# Patient Record
Sex: Male | Born: 1960
Health system: Southern US, Community
[De-identification: ages and names within clinical notes are randomized; demographics above are authoritative.]

## PROBLEM LIST (undated history)

## (undated) DIAGNOSIS — C20 Malignant neoplasm of rectum: Principal | ICD-10-CM

## (undated) DIAGNOSIS — F419 Anxiety disorder, unspecified: Secondary | ICD-10-CM

## (undated) DIAGNOSIS — Z809 Family history of malignant neoplasm, unspecified: Secondary | ICD-10-CM

## (undated) DIAGNOSIS — R002 Palpitations: Secondary | ICD-10-CM

## (undated) DIAGNOSIS — K625 Hemorrhage of anus and rectum: Secondary | ICD-10-CM

## (undated) DIAGNOSIS — R7989 Other specified abnormal findings of blood chemistry: Secondary | ICD-10-CM

## (undated) DIAGNOSIS — K219 Gastro-esophageal reflux disease without esophagitis: Secondary | ICD-10-CM

## (undated) DIAGNOSIS — Z87442 Personal history of urinary calculi: Secondary | ICD-10-CM

## (undated) DIAGNOSIS — Z932 Ileostomy status: Secondary | ICD-10-CM

## (undated) DIAGNOSIS — I499 Cardiac arrhythmia, unspecified: Secondary | ICD-10-CM

## (undated) HISTORY — DX: Malignant neoplasm of rectum: C20

## (undated) HISTORY — DX: Palpitations: R00.2

## (undated) HISTORY — DX: Anxiety disorder, unspecified: F41.9

## (undated) HISTORY — DX: Hemorrhage of anus and rectum: K62.5

## (undated) HISTORY — DX: Other specified abnormal findings of blood chemistry: R79.89

## (undated) HISTORY — DX: Family history of malignant neoplasm, unspecified: Z80.9

---

## 1998-09-01 ENCOUNTER — Emergency Department (HOSPITAL_COMMUNITY): Admission: EM | Admit: 1998-09-01 | Discharge: 1998-09-01 | Payer: Self-pay | Admitting: Emergency Medicine

## 1999-12-15 ENCOUNTER — Emergency Department (HOSPITAL_COMMUNITY): Admission: EM | Admit: 1999-12-15 | Discharge: 1999-12-15 | Payer: Self-pay | Admitting: Internal Medicine

## 1999-12-15 ENCOUNTER — Encounter: Payer: Self-pay | Admitting: Internal Medicine

## 2009-02-08 ENCOUNTER — Encounter: Payer: Self-pay | Admitting: Emergency Medicine

## 2009-02-08 ENCOUNTER — Inpatient Hospital Stay (HOSPITAL_COMMUNITY): Admission: EM | Admit: 2009-02-08 | Discharge: 2009-02-08 | Payer: Self-pay | Admitting: Urology

## 2011-02-25 LAB — COMPREHENSIVE METABOLIC PANEL
ALT: 40 U/L (ref 0–53)
AST: 28 U/L (ref 0–37)
Albumin: 3.7 g/dL (ref 3.5–5.2)
Alkaline Phosphatase: 109 U/L (ref 39–117)
BUN: 17 mg/dL (ref 6–23)
CO2: 27 mEq/L (ref 19–32)
Calcium: 9.5 mg/dL (ref 8.4–10.5)
Chloride: 105 mEq/L (ref 96–112)
Creatinine, Ser: 1.8 mg/dL — ABNORMAL HIGH (ref 0.4–1.5)
GFR calc Af Amer: 49 mL/min — ABNORMAL LOW (ref >60)
GFR calc non Af Amer: 41 mL/min — ABNORMAL LOW (ref >60)
Glucose, Bld: 140 mg/dL — ABNORMAL HIGH (ref 70–99)
Potassium: 4 mEq/L (ref 3.5–5.1)
Sodium: 139 mEq/L (ref 135–145)
Total Bilirubin: 0.7 mg/dL (ref 0.3–1.2)
Total Protein: 7.4 g/dL (ref 6.0–8.3)

## 2011-02-25 LAB — DIFFERENTIAL
Basophils Absolute: 0 10*3/uL (ref 0.0–0.1)
Eosinophils Relative: 0 % (ref 0–5)
Lymphocytes Relative: 6 % — ABNORMAL LOW (ref 12–46)
Monocytes Absolute: 0.5 10*3/uL (ref 0.1–1.0)

## 2011-02-25 LAB — CBC
HCT: 50.5 % (ref 39.0–52.0)
Hemoglobin: 17.4 g/dL — ABNORMAL HIGH (ref 13.0–17.0)
MCHC: 34.5 g/dL (ref 30.0–36.0)
MCV: 89 fL (ref 78.0–100.0)
RDW: 13.4 % (ref 11.5–15.5)

## 2011-02-25 LAB — URINALYSIS, ROUTINE W REFLEX MICROSCOPIC
Bilirubin Urine: NEGATIVE
Glucose, UA: NEGATIVE mg/dL
Ketones, ur: NEGATIVE mg/dL
Leukocytes, UA: NEGATIVE
Nitrite: NEGATIVE
Protein, ur: NEGATIVE mg/dL
Specific Gravity, Urine: 1.019 (ref 1.005–1.030)
Urobilinogen, UA: 0.2 mg/dL (ref 0.0–1.0)
pH: 6 (ref 5.0–8.0)

## 2011-02-25 LAB — LIPASE, BLOOD: Lipase: 26 U/L (ref 11–59)

## 2011-02-25 LAB — URINE MICROSCOPIC-ADD ON

## 2011-02-25 LAB — PROTIME-INR
INR: 1 (ref 0.00–1.49)
Prothrombin Time: 13.3 seconds (ref 11.6–15.2)

## 2011-02-25 LAB — APTT: aPTT: 25 seconds (ref 24–37)

## 2011-03-30 NOTE — H&P (Signed)
[name], [name]                  ACCOUNT NO.:  [id number]   MEDICAL RECORD NO.:  [id number]          PATIENT TYPE:  INP   LOCATION:  1416                         FACILITY:  [facility]   PHYSICIAN:  Maretta Bees. Vonita Moss, M.D.DATE OF BIRTH:  1961/02/06   DATE OF ADMISSION:  02/08/2009  DATE OF DISCHARGE:                              HISTORY & PHYSICAL   HISTORY:  This 50 year old gentleman had the onset yesterday of severe  left lower quadrant pain.  He might have had an increased urge to void.  He had no fever or hematuria.  He had no history of stones.  There was a  family history of stones.  In the emergency room he was found to have a  CT scan showing a 5 mm stone in the distal left ureter with mild  hydronephrosis.  He also had a small and nonobstructing renal stone.  His serum creatinine is 1.8.   He was admitted for pain relief.   PAST MEDICAL HISTORY:  Negative.   REGULAR MEDICATIONS:  None.   ALLERGIES:  None.   Tobacco:  None.   Alcohol:  Occasional beer.   FAMILY HISTORY:  Positive for stones.   REVIEW OF SYSTEMS:  No headache, dizziness, cough, chest pain, shortness  of breath, diarrhea or constipation.   PHYSICAL EXAMINATION:  VITALS:  Are as recorded.  He is alert and  oriented.  SKIN:  Warm and dry.  Very mild distress at this time after receiving  parenteral narcotics.  NECK:  Supple.  No respiratory distress.  Heart tones regular.  ABDOMEN:  Soft and nontender.  No hernias.  Penis, urethral meatus, scrotum, testicles, and epididymes unremarkable.  No peripheral edema.   IMPRESSION:  1. Distal left ureteral calculus.  2. Mild renal insufficiency.  3. Renal calculus.   PLAN:  IV hydration, Flomax, strain his urine, IV narcotics, and  consider intervention with lithotripsy or ureteroscopy pending his  clinical course.      Maretta Bees. Vonita Moss, M.D.  Electronically Signed     LJP/MEDQ  D:  02/08/2009  T:  02/08/2009  Job:  [id number]

## 2012-07-23 ENCOUNTER — Encounter (HOSPITAL_COMMUNITY): Payer: Self-pay

## 2012-07-23 ENCOUNTER — Emergency Department (HOSPITAL_COMMUNITY): Payer: 59

## 2012-07-23 ENCOUNTER — Emergency Department (HOSPITAL_COMMUNITY)
Admission: EM | Admit: 2012-07-23 | Discharge: 2012-07-23 | Disposition: A | Discharge status: Home / Self Care | Payer: 59 | Attending: Emergency Medicine | Admitting: Emergency Medicine

## 2012-07-23 DIAGNOSIS — I498 Other specified cardiac arrhythmias: Secondary | ICD-10-CM | POA: Insufficient documentation

## 2012-07-23 DIAGNOSIS — R002 Palpitations: Secondary | ICD-10-CM

## 2012-07-23 DIAGNOSIS — I493 Ventricular premature depolarization: Secondary | ICD-10-CM

## 2012-07-23 LAB — CBC
HCT: 43.7 % (ref 39.0–52.0)
Hemoglobin: 15.5 g/dL (ref 13.0–17.0)
MCH: 30.9 pg (ref 26.0–34.0)
MCHC: 35.5 g/dL (ref 30.0–36.0)
MCV: 87.2 fL (ref 78.0–100.0)
Platelets: 209 10*3/uL (ref 150–400)
RBC: 5.01 MIL/uL (ref 4.22–5.81)
RDW: 12.8 % (ref 11.5–15.5)
WBC: 3.8 10*3/uL — ABNORMAL LOW (ref 4.0–10.5)

## 2012-07-23 LAB — BASIC METABOLIC PANEL
CO2: 25 mEq/L (ref 19–32)
Calcium: 9.4 mg/dL (ref 8.4–10.5)
Creatinine, Ser: 1.39 mg/dL — ABNORMAL HIGH (ref 0.50–1.35)

## 2012-07-23 LAB — TROPONIN I: Troponin I: <0.3 ng/mL (ref <0.30)

## 2012-07-23 NOTE — ED Provider Notes (Cosign Needed)
History    51 year old male with palpitations. Onset about a week ago. Intermittent. No appreciable exacerbating relieving factors. Patient states that his heart beat sometimes feels a regular and occasionally fast. No chest pain or shortness of breath. No fevers or chills. No nausea or vomiting. Denies significant caffeine intake. Denies over-the-counter medications. Denies drug use. Denies any significant past medical history. Patient is also complaining of some tingling in his left hand along the ulnar aspect and in his little finger. No weakness. No other complaints. CSN: [id number]  Arrival date & time 07/23/12  1647   First MD Initiated Contact with Patient 07/23/12 1657      Chief Complaint  Patient presents with  . Tachycardia    (Consider location/radiation/quality/duration/timing/severity/associated sxs/prior treatment) HPI  History reviewed. No pertinent past medical history.  History reviewed. No pertinent past surgical history.  No family history on file.  History  Substance Use Topics  . Smoking status: Never Smoker   . Smokeless tobacco: Not on file  . Alcohol Use: Yes     occ      Review of Systems   Review of symptoms negative unless otherwise noted in HPI.   Allergies  Review of patient's allergies indicates no known allergies.  Home Medications  No current outpatient prescriptions on file.  BP 116/83  Pulse 67  Temp 98.6 F (37 C) (Oral)  Resp 18  Ht 5\' 9"  (1.753 m)  Wt 180 lb (81.647 kg)  BMI 26.58 kg/m2  SpO2 96%  Physical Exam  Nursing note and vitals reviewed. Constitutional: He appears well-developed and well-nourished. No distress.  HENT:  Head: Normocephalic and atraumatic.  Eyes: Conjunctivae normal are normal. Right eye exhibits no discharge. Left eye exhibits no discharge.  Neck: Neck supple.  Cardiovascular: Normal rate, regular rhythm and normal heart sounds.  Exam reveals no gallop and no friction rub.   No murmur heard.    Occasional ectopy  Pulmonary/Chest: Effort normal and breath sounds normal. No respiratory distress.  Abdominal: Soft. He exhibits no distension. There is no tenderness.  Musculoskeletal: He exhibits no edema and no tenderness.       No left elbow tenderness, swelling or pain with range of motion.  Neurological: He is alert. No cranial nerve deficit. He exhibits normal muscle tone. Coordination normal.       Sensation in the ulnar aspect of the patient's left hand, fifth finger and ulnar aspect of his fourth finger feels subjectively different as compared to the area. Radial, median and ulnar motor function is intact.  Skin: Skin is warm and dry. He is not diaphoretic.  Psychiatric: He has a normal mood and affect. His behavior is normal. Thought content normal.    ED Course  Procedures (including critical care time)  Labs Reviewed  BASIC METABOLIC PANEL - Abnormal; Notable for the following:    Glucose, Bld 110 (*)     Creatinine, Ser 1.39 (*)     GFR calc non Af Amer 58 (*)     GFR calc Af Amer 67 (*)     All other components within normal limits  CBC - Abnormal; Notable for the following:    WBC 3.8 (*)     All other components within normal limits  TROPONIN I  LAB REPORT - SCANNED   No results found.  EKG:  Rhythm: normal sinus with PVCs Rate: 65 Axis: normal Intervals: normal ST segments: NS ST changes  1. Palpitations   2. PVC (premature ventricular contraction)  MDM  50yM with palpitations. Suspect feeling PVCs which he is having fairly frequently. No known hx of CAD, no significant risk factors, denies CP or SOB and reassuring w/u today. Consider result of ischemia but doubt. Patient's heart rate is hard in the 60s and his blood pressure is normal. Will defer from a low dose of a beta blocker. Will refer to cardiology for further valuation. Suspect tingling in patient's left hand is an ulnar neuropathy given the distribution of the symptoms. Very low  suspicion for anginal equivalent. Not consistent with a CVA. Nonfocal neurological examination otherwise. Return precautions discussed. Outpatient followup otherwise.        Raeford Razor, MD 07/27/12 [phone]

## 2012-07-23 NOTE — ED Notes (Signed)
Patient with no complaints at this time. Respirations even and unlabored. Skin warm/dry. Discharge instructions reviewed with patient at this time. Patient given opportunity to voice concerns/ask questions. IV removed per policy and band-aid applied to site. Patient discharged at this time and left Emergency Department with steady gait.  

## 2012-07-23 NOTE — ED Notes (Signed)
MD at bedside. 

## 2012-07-23 NOTE — ED Notes (Signed)
Pt reports felt heart beating fast for the past week.  Denies chest pain or SOB, reports has felt light headed.  Pt says notices gets tired faster than normal.  Also c/o tingling in left hand.

## 2012-07-25 ENCOUNTER — Encounter: Payer: Self-pay | Admitting: Adult Health

## 2012-07-25 ENCOUNTER — Ambulatory Visit (INDEPENDENT_AMBULATORY_CARE_PROVIDER_SITE_OTHER): Payer: 59 | Admitting: Adult Health

## 2012-07-25 VITALS — BP 112/78 | HR 67 | Ht 69.0 in | Wt 189.5 lb

## 2012-07-25 DIAGNOSIS — R002 Palpitations: Secondary | ICD-10-CM

## 2012-07-25 NOTE — Assessment & Plan Note (Signed)
The patient has no risk factors for coronary artery disease that we are aware of. He has no history of hypertension, diabetes, or thyroid disease.. we will plan a TSH to be drawn, please take cardiac monitor for the patient to wear for 3 weeks, and also an echocardiogram will be completed. I discussed this with Dr. Dietrich Pates on site who agrees with my assessment and plan. He will followup in the office for discussion of test results. As the discretion of Dr. Dietrich Pates he can proceed with a stress Myoview for any abnormalities noted on his test results. This has been discussed with the patient verbalizes understanding and is willing to proceed with this examination.

## 2012-07-25 NOTE — Progress Notes (Signed)
   HPI: [name] is a pleasant 51 year old patient we are seeing on ER followup with no prior cardiac history, no past medical history history whatsoever of hypertension diabetes or thyroid disease. He was in his usual state of health but had a sudden onset of tachycardia palpitations and chest pressure which lasted approximately 5 days. 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 He states he noticed frequent heart racing and became concerned after it did not go away on its own and therefore was seen in the ER. In ER EKG revealed sinus rhythm with tall R waves in V2 and lead 3. He was also found to be having frequent premature ventricular complexes heart rate was in the 60s. Labs were drawn he was found to have a normal sodium potassium and glucose. His creatinine was mildly elevated at 1.39.     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 He has no family history of coronary disease. He does not smoke or drink alcohol. No Known Allergies  No current outpatient prescriptions on file.    No past medical history on file.  No past surgical history on file.  QIO:NGEXBM of systems complete and found to be negative unless listed above  PHYSICAL EXAM BP 112/78  Pulse 67  Ht 5\' 9"  (1.753 m)  Wt 189 lb 8 oz (85.957 kg)  BMI 27.98 kg/m2  General: Well developed, well nourished, in no acute distress Head: Eyes PERRLA, No xanthomas.   Normal cephalic and atramatic  Lungs: Clear bilaterally to auscultation and percussion. Heart: HRRR S1 S2, occasional extra systole.  Pulses are 2+ & equal.            No carotid bruit. No JVD.  No abdominal bruits. No femoral bruits. Abdomen: Bowel sounds are positive, abdomen soft and non-tender without masses or                  Hernia's noted. Msk:  Back normal, normal gait. Normal strength and tone for age. Extremities: No clubbing, cyanosis or edema.   DP +1 Neuro: Alert and oriented X 3. Psych:  Good affect, responds appropriately  EKG:NSR with occassional PVC's HR 65 bpm.  Tall R wave in V2.   ASSESSMENT AND PLAN

## 2012-07-25 NOTE — Patient Instructions (Signed)
Your physician has requested that you have an echocardiogram. Echocardiography is a painless test that uses sound waves to create images of your heart. It provides your doctor with information about the size and shape of your heart and how well your heart's chambers and valves are working. This procedure takes approximately one hour. There are no restrictions for this procedure.  Your physician has recommended that you wear an event monitor. Event monitors are medical devices that record the heart's electrical activity. Doctors most often [place] these monitors to diagnose arrhythmias. Arrhythmias are problems with the speed or rhythm of the heartbeat. The monitor is a small, portable device. You can wear one while you do your normal daily activities. This is usually used to diagnose what is causing palpitations/syncope (passing out).  LABS TODAY:  TSH  Follow up when results of echo and monitor are ready.

## 2012-07-26 ENCOUNTER — Ambulatory Visit: Payer: 59

## 2012-07-26 ENCOUNTER — Ambulatory Visit (HOSPITAL_COMMUNITY)
Admission: RE | Admit: 2012-07-26 | Discharge: 2012-07-26 | Disposition: A | Discharge status: Home / Self Care | Payer: 59 | Source: Ambulatory Visit | Attending: Adult Health | Admitting: Adult Health

## 2012-07-26 DIAGNOSIS — I517 Cardiomegaly: Secondary | ICD-10-CM

## 2012-07-26 DIAGNOSIS — R002 Palpitations: Secondary | ICD-10-CM | POA: Insufficient documentation

## 2012-07-26 NOTE — Progress Notes (Signed)
*  PRELIMINARY RESULTS* Echocardiogram 2D Echocardiogram has been performed.  Craig [name] 07/26/2012, 9:23 AM

## 2012-07-28 ENCOUNTER — Encounter: Payer: 59 | Admitting: Adult Health

## 2012-07-31 ENCOUNTER — Telehealth: Payer: Self-pay | Admitting: Cardiology

## 2012-07-31 ENCOUNTER — Encounter: Payer: Self-pay | Admitting: *Deleted

## 2012-07-31 NOTE — Telephone Encounter (Signed)
Patient notified that ekg was normal with symptoms of chest pain.  He drives a truck for a living and states that he needs a work note stating that he cannot drive any further than a 161 mile radius, as long trips are causing him too much stress at this point.  His employer will not allow him to come back without a note, stating this.

## 2012-07-31 NOTE — Telephone Encounter (Signed)
Patient notified and a letter has been written to state recommendations below and was given to wife this afternoon.

## 2012-07-31 NOTE — Telephone Encounter (Signed)
Pt states he stayed out of work due to chest pain on Sunday 07/30/12. States he recorded it on the heart monitor he has on. Wants to know if we can give his work a excuse letter due to chest pain

## 2012-07-31 NOTE — Telephone Encounter (Signed)
He can have the letter for temporary restriction only, until all tests are completed and he has follow-up appointment. One month only. The question of his mile restriction limits should be addressed at that visit.

## 2012-08-25 ENCOUNTER — Ambulatory Visit (INDEPENDENT_AMBULATORY_CARE_PROVIDER_SITE_OTHER): Payer: 59 | Admitting: Cardiology

## 2012-08-25 ENCOUNTER — Encounter: Payer: Self-pay | Admitting: Cardiology

## 2012-08-25 VITALS — BP 130/84 | HR 81 | Ht 69.0 in | Wt 188.0 lb

## 2012-08-25 DIAGNOSIS — Z9289 Personal history of other medical treatment: Secondary | ICD-10-CM | POA: Insufficient documentation

## 2012-08-25 DIAGNOSIS — R002 Palpitations: Secondary | ICD-10-CM | POA: Insufficient documentation

## 2012-08-25 DIAGNOSIS — F419 Anxiety disorder, unspecified: Secondary | ICD-10-CM | POA: Insufficient documentation

## 2012-08-25 NOTE — Progress Notes (Deleted)
Name: Craig [name]    DOB: [birth year]  Age: 51 y.o.  MR#: [id number]       PCP:  No primary provider on file.      Insurance: @PAYORNAME @   CC:   No chief complaint on file.  NO MEDICATIONS  VS BP 130/84  Pulse 81  Ht 5\' 9"  (1.753 m)  Wt 188 lb (85.276 kg)  BMI 27.76 kg/m2  SpO2 96%  Weights Current Weight  08/25/12 188 lb (85.276 kg)  07/25/12 189 lb 8 oz (85.957 kg)  07/23/12 180 lb (81.647 kg)    Blood Pressure  BP Readings from Last 3 Encounters:  08/25/12 130/84  07/25/12 112/78  07/23/12 116/83     Admit date:  (Not on file) Last encounter with RMR:  07/31/2012   Allergy No Known Allergies  No current outpatient prescriptions on file.    Discontinued Meds:   There are no discontinued medications.  Patient Active Problem List  Diagnosis  . Palpitations  . Anxiety  . History of diagnostic tests    LABS Office Visit on 07/25/2012  Component Date Value  . TSH 07/25/2012 1.442   Admission on 07/23/2012, Discharged on 07/23/2012  Component Date Value  . Sodium 07/23/2012 135   . Potassium 07/23/2012 3.7   . Chloride 07/23/2012 103   . CO2 07/23/2012 25   . Glucose, Bld 07/23/2012 110*  . BUN 07/23/2012 17   . Creatinine, Ser 07/23/2012 1.39*  . Calcium 07/23/2012 9.4   . GFR calc non Af Amer 07/23/2012 58*  . GFR calc Af Amer 07/23/2012 67*  . Troponin I 07/23/2012 <0.30   . WBC 07/23/2012 3.8*  . RBC 07/23/2012 5.01   . Hemoglobin 07/23/2012 15.5   . HCT 07/23/2012 43.7   . MCV 07/23/2012 87.2   . [facility] 07/23/2012 30.9   . MCHC 07/23/2012 35.5   . RDW 07/23/2012 12.8   . Platelets 07/23/2012 209      Results for this Opt Visit:     Results for orders placed in visit on 07/25/12  TSH      Component Value Range   TSH 1.442  0.350 - 4.500 uIU/mL    EKG Orders placed in visit on 07/25/12  . EKG 12-LEAD  . CARDIAC EVENT MONITOR     Prior Assessment and Plan Problem List as of 08/25/2012          Palpitations   Anxiety   History of  diagnostic tests       Imaging: No results found.   FRS Calculation: Score not calculated. Missing: Total Cholesterol, HDL

## 2012-08-25 NOTE — Assessment & Plan Note (Signed)
There is no evidence for structural heart disease and no indication of other significant pathologic conditions.  PVCs are demonstrated, but are not clearly the cause of his symptoms.  His likelihood of experiencing an arrhythmia with potentially serious consequences is minimal.  He will report any recurrences and will proceed to a medical facility if he experiences a prolonged episode that will permit obtaining an EKG or rhythm strip.

## 2012-08-25 NOTE — Progress Notes (Signed)
Patient ID: Craig [name], male   DOB: [birth year], 51 y.o.   MRN: [id number]  HPI: Patient experiences episodes of chest discomfort, which he describes as a pinching sensation, lasting a matter of seconds.  These were reported during event recording, but I have no strips listing such symptoms.  Only PVCs were identified, frequent at times.  He had no recurrence of the tachypalpitations that first brought him to medical attention.  He consumes modest amounts of caffeine in the form of tea.  He has used energy preparations in the past, but not for months.  He denies significant alcohol intake, illicit substances, or over-the-counter stimulants.  Prior to Admission medications   None  No Known Allergies    Past medical history, social history, and family history reviewed and updated.  ROS: Exercises regularly running up to 6 miles without difficulty.  PHYSICAL EXAM: BP 130/84  Pulse 81  Ht 5\' 9"  (1.753 m)  Wt 85.276 kg (188 lb)  BMI 27.76 kg/m2  SpO2 96%  General-Well developed; no acute distress Body habitus-proportionate weight and height Neck-No JVD; no carotid bruits Lungs-clear lung fields; resonant to percussion Cardiovascular-normal PMI; normal S1 and S2 Abdomen-normal bowel sounds; soft and non-tender without masses or organomegaly Musculoskeletal-No deformities, no cyanosis or clubbing Neurologic-Normal cranial nerves; symmetric strength and tone Skin-Warm, no significant lesions Extremities-distal pulses intact; no edema  ASSESSMENT AND PLAN:  [place] Bing, MD 08/25/2012 3:06 PM

## 2012-08-25 NOTE — Patient Instructions (Addendum)
Your physician recommends that you schedule a follow-up appointment in: 6 months  

## 2012-09-14 ENCOUNTER — Other Ambulatory Visit: Payer: Self-pay | Admitting: Adult Health

## 2012-09-14 DIAGNOSIS — R002 Palpitations: Secondary | ICD-10-CM

## 2014-07-29 ENCOUNTER — Encounter: Payer: 59 | Admitting: Cardiology

## 2014-07-29 NOTE — Progress Notes (Signed)
ERROR

## 2017-01-06 ENCOUNTER — Encounter (INDEPENDENT_AMBULATORY_CARE_PROVIDER_SITE_OTHER): Payer: Self-pay | Admitting: Internal Medicine

## 2017-01-06 ENCOUNTER — Encounter (INDEPENDENT_AMBULATORY_CARE_PROVIDER_SITE_OTHER): Payer: Self-pay

## 2017-01-07 ENCOUNTER — Encounter (INDEPENDENT_AMBULATORY_CARE_PROVIDER_SITE_OTHER): Payer: Self-pay | Admitting: *Deleted

## 2017-01-07 ENCOUNTER — Ambulatory Visit (INDEPENDENT_AMBULATORY_CARE_PROVIDER_SITE_OTHER): Payer: Self-pay | Admitting: Internal Medicine

## 2017-01-07 ENCOUNTER — Other Ambulatory Visit (INDEPENDENT_AMBULATORY_CARE_PROVIDER_SITE_OTHER): Payer: Self-pay | Admitting: Internal Medicine

## 2017-01-07 ENCOUNTER — Encounter (INDEPENDENT_AMBULATORY_CARE_PROVIDER_SITE_OTHER): Payer: Self-pay | Admitting: Internal Medicine

## 2017-01-07 VITALS — BP 118/90 | HR 72 | Temp 98.1°F | Ht 69.5 in | Wt 195.0 lb

## 2017-01-07 DIAGNOSIS — K6289 Other specified diseases of anus and rectum: Secondary | ICD-10-CM

## 2017-01-07 DIAGNOSIS — K625 Hemorrhage of anus and rectum: Secondary | ICD-10-CM

## 2017-01-07 DIAGNOSIS — K629 Disease of anus and rectum, unspecified: Secondary | ICD-10-CM

## 2017-01-07 HISTORY — DX: Hemorrhage of anus and rectum: K62.5

## 2017-01-07 NOTE — Patient Instructions (Signed)
Colonoscopy.  The risks and benefits such as perforation, bleeding, and infection were reviewed with the patient and is agreeable. 

## 2017-01-07 NOTE — Progress Notes (Signed)
   Subjective:    Patient ID: Craig [name], male    DOB: 24-Mar-1961, 56 y.o.   MRN: [id number]  HPI Referred by [facility]. Recently seen in ED at [facility] (01/03/2017) with c/o rectal bleeding/constipation.  Had been having rectal bleeding for several weeks.  He states he has blood in his bowel. Two weeks ago he had diarrhea and he saw blood. The diarrhea lasted for 2-3 weeks and resolved. Has been seeing blood for about a month.  He says he has constipation. He has to strain to have a BM. He takes laxatives to have a BM. Hx of constipation.  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  He see blood every day with his BMs.  He has a BM 1-2 times a day. No weight loss.  His appetite is good. No abdominal pain.  He has never undergone a colonoscopy. No family hx of colon cancer. Rarely uses NSAIDs  01/03/2017 Hand H 16.9 and 49.7, AST 39.6, ALT 77, ALP 178/  Review of Systems Past Medical History:  Diagnosis Date  . Anxiety    Truck driver  . Elevated serum creatinine    1.39 in 06/2012  . Palpitations    + chest pressure; PVCs  . Rectal bleeding 01/07/2017    No past surgical history on file.  No Known Allergies  No current outpatient prescriptions on file prior to visit.   No current facility-administered medications on file prior to visit.        Objective:   Physical Exam Blood pressure 118/90, pulse 72, temperature 98.1 F (36.7 C), height 5' 9.5" (1.765 m), weight 195 lb (88.5 kg). Alert and oriented. Skin warm and dry. Oral mucosa is moist.   . Sclera anicteric, conjunctivae is pink. Thyroid not enlarged. No cervical lymphadenopathy. Lungs clear. Heart regular rate and rhythm.  Abdomen is soft. Bowel sounds are positive. No hepatomegaly. No abdominal masses felt. No tenderness.  No edema to lower extremities.  Rectal mass felt. Guaiac positive.         Assessment & Plan:  Rectal bleeding/rectal mass.  Colonic neoplasm needs to be ruled out.  Hemorrhoids, polyps, ulcers, AVM also  in differential.

## 2017-01-10 NOTE — OR Nursing (Signed)
0643.  Patient has not arrived to register for Colonoscopy.  I attempted to call his mobile telephone number with no answer.  I spoke with Dr Laural Golden to advise him of this.  I will call Dr Laural Golden if the patient comes in.

## 2017-01-13 ENCOUNTER — Encounter (INDEPENDENT_AMBULATORY_CARE_PROVIDER_SITE_OTHER): Payer: Self-pay

## 2017-01-21 ENCOUNTER — Encounter (HOSPITAL_COMMUNITY): Admission: RE | Payer: Self-pay | Source: Ambulatory Visit

## 2017-01-21 ENCOUNTER — Ambulatory Visit (HOSPITAL_COMMUNITY): Admission: RE | Admit: 2017-01-21 | Payer: Self-pay | Source: Ambulatory Visit | Admitting: Internal Medicine

## 2017-01-21 SURGERY — COLONOSCOPY
Anesthesia: Moderate Sedation

## 2017-02-04 ENCOUNTER — Emergency Department (HOSPITAL_COMMUNITY): Admission: EM | Admit: 2017-02-04 | Discharge: 2017-02-04 | Discharge status: Absent without leave | Payer: Self-pay

## 2017-07-04 ENCOUNTER — Emergency Department (HOSPITAL_COMMUNITY)
Admission: EM | Admit: 2017-07-04 | Discharge: 2017-07-04 | Disposition: A | Discharge status: Home / Self Care | Payer: Self-pay | Attending: Emergency Medicine | Admitting: Emergency Medicine

## 2017-07-04 ENCOUNTER — Encounter (HOSPITAL_COMMUNITY): Payer: Self-pay | Admitting: Emergency Medicine

## 2017-07-04 DIAGNOSIS — Z87891 Personal history of nicotine dependence: Secondary | ICD-10-CM | POA: Insufficient documentation

## 2017-07-04 DIAGNOSIS — K625 Hemorrhage of anus and rectum: Secondary | ICD-10-CM | POA: Insufficient documentation

## 2017-07-04 DIAGNOSIS — K6289 Other specified diseases of anus and rectum: Secondary | ICD-10-CM | POA: Insufficient documentation

## 2017-07-04 LAB — COMPREHENSIVE METABOLIC PANEL
ALT: 27 U/L (ref 17–63)
AST: 19 U/L (ref 15–41)
Albumin: 3.6 g/dL (ref 3.5–5.0)
Alkaline Phosphatase: 112 U/L (ref 38–126)
Anion gap: 6 (ref 5–15)
BUN: 16 mg/dL (ref 6–20)
CALCIUM: 9.1 mg/dL (ref 8.9–10.3)
CHLORIDE: 109 mmol/L (ref 101–111)
CO2: 23 mmol/L (ref 22–32)
CREATININE: 1.13 mg/dL (ref 0.61–1.24)
GFR calc non Af Amer: >60 mL/min (ref >60)
Glucose, Bld: 87 mg/dL (ref 65–99)
Potassium: 3.8 mmol/L (ref 3.5–5.1)
Sodium: 138 mmol/L (ref 135–145)
Total Bilirubin: 0.6 mg/dL (ref 0.3–1.2)
Total Protein: 7.2 g/dL (ref 6.5–8.1)

## 2017-07-04 LAB — POC OCCULT BLOOD, ED: FECAL OCCULT BLD: POSITIVE — AB

## 2017-07-04 LAB — CBC WITH DIFFERENTIAL/PLATELET
BASOS ABS: 0 10*3/uL (ref 0.0–0.1)
Basophils Relative: 0 %
EOS ABS: 0.2 10*3/uL (ref 0.0–0.7)
EOS PCT: 4 %
HCT: 45.2 % (ref 39.0–52.0)
HEMOGLOBIN: 15.8 g/dL (ref 13.0–17.0)
LYMPHS ABS: 1.5 10*3/uL (ref 0.7–4.0)
Lymphocytes Relative: 33 %
MCH: 29.9 pg (ref 26.0–34.0)
MCHC: 35 g/dL (ref 30.0–36.0)
MCV: 85.6 fL (ref 78.0–100.0)
Monocytes Absolute: 0.4 10*3/uL (ref 0.1–1.0)
Monocytes Relative: 8 %
NEUTROS PCT: 55 %
Neutro Abs: 2.5 10*3/uL (ref 1.7–7.7)
PLATELETS: 250 10*3/uL (ref 150–400)
RBC: 5.28 MIL/uL (ref 4.22–5.81)
RDW: 12.9 % (ref 11.5–15.5)
WBC: 4.6 10*3/uL (ref 4.0–10.5)

## 2017-07-04 MED ORDER — HYDROCORTISONE ACETATE 25 MG RE SUPP: 25.0000 mg | Freq: Two times a day (BID) | RECTAL | 0 refills | Status: DC

## 2017-07-04 NOTE — ED Provider Notes (Signed)
[facility] DEPT Provider Note   CSN: [id number] Arrival date & time: 07/04/17  1547     History   Chief Complaint Chief Complaint  Patient presents with  . Rectal Pain    HPI [name] is a 56 y.o. male.  HPI   Craig [name] is a 56 y.o. male, with a history of rectal bleeding, presenting to the ED with rectal pain and bleeding for the last three months. States he has bleeding and pain when he pushes to have a BM. Also notes a mass "comes out" of the rectum with straining, then goes back in when he relaxes. Bleeding is enough to color the toilet water and looks dark on the stool itself.  Last normal BM was about two weeks ago. Has had consipation for the last few months. Has used mag citrate with some relief, but has not used it recently. Denies chronic narcotic use. Patient is a Administrator for a living. Rarely uses alcohol, but has been using ibuprofen daily for the last 2-3 weeks due to rectal pain.   Denies fever/chills, N/V/D, abdominal pain, urinary complaints, or any other complaints.    Past Medical History:  Diagnosis Date  . Anxiety    Truck driver  . Elevated serum creatinine    1.39 in 06/2012  . Palpitations    + chest pressure; PVCs  . Rectal bleeding 01/07/2017    Patient Active Problem List   Diagnosis Date Noted  . Rectal bleeding 01/07/2017  . Rectal mass 01/07/2017  . History of diagnostic tests 08/25/2012  . Palpitations   . Anxiety     History reviewed. No pertinent surgical history.     Home Medications    Prior to Admission medications   Medication Sig Start Date End Date Taking? Authorizing Provider  acetaminophen (TYLENOL) 325 MG tablet Take 650 mg by mouth every 6 (six) hours as needed for moderate pain or headache.    Yes [provider]  diphenhydrAMINE (SOMINEX) 25 MG tablet Take 50 mg by mouth at bedtime as needed for sleep.   Yes [provider]  ibuprofen (ADVIL,MOTRIN) 200 MG tablet Take 200-400 mg by  mouth every 6 (six) hours as needed for headache or moderate pain.    Yes [provider]  Naphazoline HCl (CLEAR EYES OP) Apply 1 drop to eye daily as needed (dry eyes).   Yes [provider]  oxymetazoline (AFRIN) 0.05 % nasal spray Place 1 spray into both nostrils 2 (two) times daily as needed for congestion.   Yes [provider]  hydrocortisone (ANUSOL-HC) 25 MG suppository Place 1 suppository (25 mg total) rectally 2 (two) times daily. 07/04/17   Lorayne Bender, PA-C    Family History History reviewed. No pertinent family history.  Social History Social History  Substance Use Topics  . Smoking status: Former Research scientist (life sciences)  . Smokeless tobacco: Never Used     Comment: quit at age 89  . Alcohol use Yes     Comment: occ     Allergies   Patient has no known allergies.   Review of Systems Review of Systems  Constitutional: Negative for chills, diaphoresis and fever.  Respiratory: Negative for shortness of breath.   Cardiovascular: Negative for chest pain.  Gastrointestinal: Positive for blood in stool and constipation. Negative for abdominal pain, nausea and vomiting.  Genitourinary: Negative for dysuria, flank pain and hematuria.  Musculoskeletal: Negative for back pain.  All other systems reviewed and are negative.  Physical Exam Updated Vital Signs BP (!) 130/104 (BP Location: Right Arm)   Pulse 70   Temp 99 F (37.2 C) (Oral)   Resp 16   SpO2 100%   Physical Exam  Constitutional: He appears well-developed and well-nourished. No distress.  HENT:  Head: Normocephalic and atraumatic.  Eyes: Conjunctivae are normal.  Neck: Neck supple.  Cardiovascular: Normal rate, regular rhythm, normal heart sounds and intact distal pulses.   Pulmonary/Chest: Effort normal and breath sounds normal. No respiratory distress.  Abdominal: Soft. There is no tenderness. There is no guarding.  Genitourinary: Rectal exam shows guaiac positive stool.  Genitourinary  Comments: No external hemorrhoids, fissures, or lesions noted. No noted internal hemorrhoids. No stool burden. Minor posterior rectal tenderness. Small amount of bright red gross blood. RN, Wells Guiles, served as chaperone during the rectal exam.  Musculoskeletal: He exhibits no edema.  Lymphadenopathy:    He has no cervical adenopathy.  Neurological: He is alert.  Skin: Skin is warm and dry. Capillary refill takes less than 2 seconds. He is not diaphoretic. No pallor.  Psychiatric: He has a normal mood and affect. His behavior is normal.  Nursing note and vitals reviewed.    ED Treatments / Results  Labs (all labs ordered are listed, but only abnormal results are displayed) Labs Reviewed  POC OCCULT BLOOD, ED - Abnormal; Notable for the following:       Result Value   Fecal Occult Bld POSITIVE (*)    All other components within normal limits  COMPREHENSIVE METABOLIC PANEL  CBC WITH DIFFERENTIAL/PLATELET    EKG  EKG Interpretation None       Radiology No results found.  Procedures Procedures (including critical care time)  Medications Ordered in ED Medications - No data to display   Initial Impression / Assessment and Plan / ED Course  I have reviewed the triage vital signs and the nursing notes.  Pertinent labs & imaging results that were available during my care of the patient were reviewed by me and considered in my medical decision making (see chart for details).      Patient presents with complaint of rectal bleeding and pain. Hemoccult positive. Hemoglobin normal. Patient has already been established with GI. The patient was given instructions for home care as well as return precautions. Patient voices understanding of these instructions, accepts the plan, and is comfortable with discharge.    A chart review reveals patient has been evaluated by Deberah Castle, NP at [facility] for [place] for an identical complaint on 01/07/17. He was  guaiac positive at that time. He was recommended for a colonoscopy as the next step. He was scheduled for a colonoscopy to be performed by Dr. Laural Golden on 01/21/17, but patient failed to show up.  When the patient was asked about this, he states, "I didn't go because I guess I thought it would go away. I meant to reschedule, but just haven't."   Final Clinical Impressions(s) / ED Diagnoses   Final diagnoses:  Rectal pain  Rectal bleeding    New Prescriptions New Prescriptions   HYDROCORTISONE (ANUSOL-HC) 25 MG SUPPOSITORY    Place 1 suppository (25 mg total) rectally 2 (two) times daily.     Lorayne Bender, PA-C 07/04/17 2301    Sherwood Gambler, MD 07/06/17 2312

## 2017-07-04 NOTE — Discharge Instructions (Signed)
There is evidence of blood in your stool, however, your other lab results were reassuring. It is highly recommended that you get a colonoscopy, as previously recommended by the GI specialist. This can be with the GI specialist you have already seen or you may follow up with the specialist in [place].  May use the hydrocortisone suppository to give some comfort. May also use over the counter medications like colace to soften your stool. Please increase your water and fiber intake. Also stop using NSAIDs, such as ibuprofen or naproxen.

## 2017-07-04 NOTE — ED Triage Notes (Signed)
Pt states that he believes he has hemmrrhoids because a large mass comes out when he has a bowel movement (with blood) and he has also had constipation. Alert and oriented.

## 2017-07-07 ENCOUNTER — Telehealth (INDEPENDENT_AMBULATORY_CARE_PROVIDER_SITE_OTHER): Payer: Self-pay | Admitting: Internal Medicine

## 2017-07-07 NOTE — Telephone Encounter (Signed)
Patient called and wanted to reschedule his colonoscopy.  Ann reviewed the patient's chart and since it had been a while since we've seen him, I called to schedule him an appointment.  He stated that he just started a new job and his insurance will not kick in for another month.  He wants to make sure his insurance is in effect before he schedules an office visit.  I offered to go ahead and schedule him for a month out and he stated that he wants to make sure for sure his insurance is in effect first and he'll call us back to schedule.  [phone]

## 2017-07-12 ENCOUNTER — Ambulatory Visit (HOSPITAL_COMMUNITY)
Admission: EM | Admit: 2017-07-12 | Discharge: 2017-07-12 | Disposition: A | Discharge status: Home / Self Care | Payer: Self-pay | Attending: Emergency Medicine | Admitting: Emergency Medicine

## 2017-07-12 ENCOUNTER — Encounter (HOSPITAL_COMMUNITY): Payer: Self-pay | Admitting: Family Medicine

## 2017-07-12 DIAGNOSIS — Z0289 Encounter for other administrative examinations: Secondary | ICD-10-CM

## 2017-07-12 DIAGNOSIS — Z7689 Persons encountering health services in other specified circumstances: Secondary | ICD-10-CM

## 2017-07-12 NOTE — ED Provider Notes (Signed)
[facility]   [id number] 07/12/17 Arrival Time: 1058   SUBJECTIVE:  Craig [name] is a 56 y.o. male who presents to the urgent care  with complaint of needing clearance to return to work. He has had a history of intermittent rectal bleeding, is been evaluated by GI, and the ER [facility] for this complaint, he is scheduled to have an upcoming colonoscopy next month after his insurance kicks in. Following his visit [facility], he received a work clearance allowing him to return to work without condition, however he is Production designer, theatre/television/film, and needed the clearance to have clear language. Medical records were reviewed, and the medication prescribed at the ER visit she gave no impact on driving, use Anusol suppositories. He is currently eating a high-fiber diet, and has had no bleeding or issues since his visit.  ROS: As per HPI, remainder of ROS negative.   OBJECTIVE:  Vitals:   07/12/17 1141  BP: (!) 139/92  Pulse: 89  Resp: 18  Temp: 98 F (36.7 C)  SpO2: 100%     General appearance: alert; no distress HEENT: normocephalic; atraumatic; conjunctivae normal;  Neck: Trachea midline, no JVD noted Lungs: clear to auscultation bilaterally Heart: regular rate and rhythm, S1, and S2, no murmur heard intact distal pulses Abdomen: soft, non-tender; bowel sounds normal; no masses or organomegaly; no guarding or rebound tenderness Musculoskeletal: Grossly symmetrical without edema Skin: warm and dry Neurologic: Grossly normal Psychological:  alert and cooperative; normal mood and affect     ASSESSMENT & PLAN:  1. Return to work exam     Provided work note, encouraged to follow up with GI when possible  Reviewed expectations re: course of current medical issues. Questions answered. Outlined signs and symptoms indicating need for more acute intervention. Patient verbalized understanding. After Visit Summary given.    Procedures:     Results for orders placed  or performed during the hospital encounter of 07/04/17  Comprehensive metabolic panel  Result Value Ref Range   Sodium 138 135 - 145 mmol/L   Potassium 3.8 3.5 - 5.1 mmol/L   Chloride 109 101 - 111 mmol/L   CO2 23 22 - 32 mmol/L   Glucose, Bld 87 65 - 99 mg/dL   BUN 16 6 - 20 mg/dL   Creatinine, Ser 1.13 0.61 - 1.24 mg/dL   Calcium 9.1 8.9 - 10.3 mg/dL   Total Protein 7.2 6.5 - 8.1 g/dL   Albumin 3.6 3.5 - 5.0 g/dL   AST 19 15 - 41 U/L   ALT 27 17 - 63 U/L   Alkaline Phosphatase 112 38 - 126 U/L   Total Bilirubin 0.6 0.3 - 1.2 mg/dL   GFR calc non Af Amer >60 >60 mL/min   GFR calc Af Amer >60 >60 mL/min   Anion gap 6 5 - 15  CBC with Differential  Result Value Ref Range   WBC 4.6 4.0 - 10.5 K/uL   RBC 5.28 4.22 - 5.81 MIL/uL   Hemoglobin 15.8 13.0 - 17.0 g/dL   HCT 45.2 39.0 - 52.0 %   MCV 85.6 78.0 - 100.0 fL   MCH 29.9 26.0 - 34.0 pg   MCHC 35.0 30.0 - 36.0 g/dL   RDW 12.9 11.5 - 15.5 %   Platelets 250 150 - 400 K/uL   Neutrophils Relative % 55 %   Neutro Abs 2.5 1.7 - 7.7 K/uL   Lymphocytes Relative 33 %   Lymphs Abs 1.5 0.7 - 4.0 K/uL  Monocytes Relative 8 %   Monocytes Absolute 0.4 0.1 - 1.0 K/uL   Eosinophils Relative 4 %   Eosinophils Absolute 0.2 0.0 - 0.7 K/uL   Basophils Relative 0 %   Basophils Absolute 0.0 0.0 - 0.1 K/uL  POC occult blood, ED Provider will collect  Result Value Ref Range   Fecal Occult Bld POSITIVE (A) NEGATIVE    Labs Reviewed - No data to display  No results found.  No Known Allergies  PMHx, SurgHx, SocialHx, Medications, and Allergies were reviewed in the Visit Navigator and updated as appropriate.       Barnet Glasgow, NP 07/12/17 1154

## 2017-07-12 NOTE — ED Triage Notes (Signed)
Pt here for work note and wanting to return.

## 2017-07-12 NOTE — Discharge Instructions (Signed)
Follow-up with your GI specialist for further evaluation, as this will need colonoscopy

## 2017-09-06 ENCOUNTER — Ambulatory Visit (INDEPENDENT_AMBULATORY_CARE_PROVIDER_SITE_OTHER): Payer: Self-pay | Admitting: Internal Medicine

## 2017-09-07 ENCOUNTER — Other Ambulatory Visit (INDEPENDENT_AMBULATORY_CARE_PROVIDER_SITE_OTHER): Payer: Self-pay | Admitting: Internal Medicine

## 2017-09-07 ENCOUNTER — Encounter (INDEPENDENT_AMBULATORY_CARE_PROVIDER_SITE_OTHER): Payer: Self-pay | Admitting: *Deleted

## 2017-09-07 ENCOUNTER — Ambulatory Visit (INDEPENDENT_AMBULATORY_CARE_PROVIDER_SITE_OTHER): Payer: BLUE CROSS/BLUE SHIELD | Admitting: Internal Medicine

## 2017-09-07 ENCOUNTER — Encounter (INDEPENDENT_AMBULATORY_CARE_PROVIDER_SITE_OTHER): Payer: Self-pay

## 2017-09-07 ENCOUNTER — Encounter (INDEPENDENT_AMBULATORY_CARE_PROVIDER_SITE_OTHER): Payer: Self-pay | Admitting: Internal Medicine

## 2017-09-07 VITALS — BP 110/80 | HR 60 | Ht 69.0 in | Wt 185.1 lb

## 2017-09-07 DIAGNOSIS — K629 Disease of anus and rectum, unspecified: Secondary | ICD-10-CM | POA: Diagnosis not present

## 2017-09-07 DIAGNOSIS — K6289 Other specified diseases of anus and rectum: Secondary | ICD-10-CM

## 2017-09-07 NOTE — Patient Instructions (Signed)
Colonoscopy. The risks of bleeding, perforation and infection were reviewed with patient.  

## 2017-09-07 NOTE — Progress Notes (Signed)
   Subjective:    Patient ID: Craig [name], male    DOB: 14-Mar-1961, 56 y.o.   MRN: [id number]  HPI Here today for f/u. Last seen in February of this with rectal bleeding/constipation. Rectal mass was felt and he was scheduled for a colonoscopy, however he cancelled the procedure. . States he has feeling of pressure and being backed up all the times. He still has rectal bleeding.  Has a BM x 1 every 2 weeks. Sees blood every day. No family hx of colon cancer. Recently seen in the ED for same.   No NSAIDs CBC    Component Value Date/Time   WBC 4.6 07/04/2017 2154   RBC 5.28 07/04/2017 2154   HGB 15.8 07/04/2017 2154   HCT 45.2 07/04/2017 2154   PLT 250 07/04/2017 2154   MCV 85.6 07/04/2017 2154   MCH 29.9 07/04/2017 2154   MCHC 35.0 07/04/2017 2154   RDW 12.9 07/04/2017 2154   LYMPHSABS 1.5 07/04/2017 2154   MONOABS 0.4 07/04/2017 2154   EOSABS 0.2 07/04/2017 2154   BASOSABS 0.0 07/04/2017 2154       Review of Systems     Objective:   Physical Exam Blood pressure 110/80, pulse 60, height 5\' 9"  (1.753 m), weight 185 lb 1.6 oz (84 kg). Alert and oriented. Skin warm and dry. Oral mucosa is moist.   . Sclera anicteric, conjunctivae is pink. Thyroid not enlarged. No cervical lymphadenopathy. Lungs clear. Heart regular rate and rhythm.  Abdomen is soft. Bowel sounds are positive. No hepatomegaly. No abdominal masses felt. No tenderness.  No edema to lower extremities.            Assessment & Plan:  Rectal mass. Colonic neoplasm needs to be ruled out. Polyp also in the differential.  CEA

## 2017-09-08 ENCOUNTER — Ambulatory Visit (HOSPITAL_COMMUNITY)
Admission: RE | Admit: 2017-09-08 | Discharge: 2017-09-08 | Disposition: A | Discharge status: Home / Self Care | Payer: BLUE CROSS/BLUE SHIELD | Source: Ambulatory Visit | Attending: Internal Medicine | Admitting: Internal Medicine

## 2017-09-08 ENCOUNTER — Encounter (HOSPITAL_COMMUNITY): Payer: Self-pay | Admitting: Emergency Medicine

## 2017-09-08 ENCOUNTER — Other Ambulatory Visit (INDEPENDENT_AMBULATORY_CARE_PROVIDER_SITE_OTHER): Payer: Self-pay | Admitting: Internal Medicine

## 2017-09-08 ENCOUNTER — Encounter (HOSPITAL_COMMUNITY): Payer: Self-pay | Admitting: *Deleted

## 2017-09-08 ENCOUNTER — Emergency Department (HOSPITAL_COMMUNITY)
Admission: EM | Admit: 2017-09-08 | Discharge: 2017-09-08 | Disposition: A | Discharge status: Home / Self Care | Payer: BLUE CROSS/BLUE SHIELD | Source: Home / Self Care | Attending: Emergency Medicine | Admitting: Emergency Medicine

## 2017-09-08 ENCOUNTER — Encounter (HOSPITAL_COMMUNITY)
Admission: RE | Disposition: A | Discharge status: Home / Self Care | Payer: Self-pay | Source: Ambulatory Visit | Attending: Internal Medicine

## 2017-09-08 DIAGNOSIS — Z87891 Personal history of nicotine dependence: Secondary | ICD-10-CM

## 2017-09-08 DIAGNOSIS — R194 Change in bowel habit: Secondary | ICD-10-CM | POA: Diagnosis not present

## 2017-09-08 DIAGNOSIS — C20 Malignant neoplasm of rectum: Secondary | ICD-10-CM

## 2017-09-08 DIAGNOSIS — K5900 Constipation, unspecified: Secondary | ICD-10-CM | POA: Insufficient documentation

## 2017-09-08 DIAGNOSIS — Z791 Long term (current) use of non-steroidal anti-inflammatories (NSAID): Secondary | ICD-10-CM

## 2017-09-08 DIAGNOSIS — Z79899 Other long term (current) drug therapy: Secondary | ICD-10-CM | POA: Insufficient documentation

## 2017-09-08 DIAGNOSIS — K6289 Other specified diseases of anus and rectum: Secondary | ICD-10-CM | POA: Insufficient documentation

## 2017-09-08 DIAGNOSIS — K625 Hemorrhage of anus and rectum: Secondary | ICD-10-CM | POA: Diagnosis not present

## 2017-09-08 DIAGNOSIS — F419 Anxiety disorder, unspecified: Secondary | ICD-10-CM | POA: Insufficient documentation

## 2017-09-08 DIAGNOSIS — K922 Gastrointestinal hemorrhage, unspecified: Secondary | ICD-10-CM | POA: Insufficient documentation

## 2017-09-08 DIAGNOSIS — R195 Other fecal abnormalities: Secondary | ICD-10-CM | POA: Diagnosis not present

## 2017-09-08 HISTORY — PX: COLONOSCOPY: SHX5424

## 2017-09-08 LAB — COMPREHENSIVE METABOLIC PANEL
ALBUMIN: 3.6 g/dL (ref 3.5–5.0)
ALK PHOS: 105 U/L (ref 38–126)
ALT: 40 U/L (ref 17–63)
ANION GAP: 10 (ref 5–15)
AST: 28 U/L (ref 15–41)
BILIRUBIN TOTAL: 0.6 mg/dL (ref 0.3–1.2)
BUN: 12 mg/dL (ref 6–20)
CALCIUM: 9 mg/dL (ref 8.9–10.3)
CO2: 23 mmol/L (ref 22–32)
Chloride: 107 mmol/L (ref 101–111)
Creatinine, Ser: 1.05 mg/dL (ref 0.61–1.24)
GFR calc Af Amer: >60 mL/min (ref >60)
GFR calc non Af Amer: >60 mL/min (ref >60)
GLUCOSE: 106 mg/dL — AB (ref 65–99)
POTASSIUM: 3.6 mmol/L (ref 3.5–5.1)
SODIUM: 140 mmol/L (ref 135–145)
TOTAL PROTEIN: 7.4 g/dL (ref 6.5–8.1)

## 2017-09-08 LAB — CBC
HEMATOCRIT: 41.3 % (ref 39.0–52.0)
HEMOGLOBIN: 13.8 g/dL (ref 13.0–17.0)
MCH: 29.7 pg (ref 26.0–34.0)
MCHC: 33.4 g/dL (ref 30.0–36.0)
MCV: 88.8 fL (ref 78.0–100.0)
Platelets: 256 10*3/uL (ref 150–400)
RBC: 4.65 MIL/uL (ref 4.22–5.81)
RDW: 13.1 % (ref 11.5–15.5)
WBC: 6.3 10*3/uL (ref 4.0–10.5)

## 2017-09-08 LAB — TYPE AND SCREEN
ABO/RH(D): O POS
Antibody Screen: NEGATIVE

## 2017-09-08 LAB — CEA: CEA: 58 ng/mL — ABNORMAL HIGH

## 2017-09-08 SURGERY — COLONOSCOPY
Anesthesia: Moderate Sedation

## 2017-09-08 MED ORDER — LIDOCAINE HCL 2 % EX GEL
CUTANEOUS | Status: AC
  Filled 2017-09-08: qty 30

## 2017-09-08 MED ORDER — MIDAZOLAM HCL 5 MG/5ML IJ SOLN
INTRAMUSCULAR | Status: DC | PRN
  Administered 2017-09-08: 1 mg via INTRAVENOUS
  Administered 2017-09-08 (×3): 2 mg via INTRAVENOUS
  Administered 2017-09-08: 1 mg via INTRAVENOUS

## 2017-09-08 MED ORDER — LIDOCAINE HCL 2 % EX GEL
CUTANEOUS | Status: DC | PRN
  Administered 2017-09-08: 1 via TOPICAL

## 2017-09-08 MED ORDER — DOCUSATE SODIUM 100 MG PO CAPS: 200.0000 mg | ORAL_CAPSULE | Freq: Every day | ORAL | 0 refills | Status: DC

## 2017-09-08 MED ORDER — MIDAZOLAM HCL 5 MG/5ML IJ SOLN
INTRAMUSCULAR | Status: AC
  Filled 2017-09-08: qty 10

## 2017-09-08 MED ORDER — MEPERIDINE HCL 50 MG/ML IJ SOLN
INTRAMUSCULAR | Status: DC | PRN
  Administered 2017-09-08 (×2): 25 mg via INTRAVENOUS

## 2017-09-08 MED ORDER — STERILE WATER FOR IRRIGATION IR SOLN
Status: DC | PRN
  Administered 2017-09-08: 2.5 mL

## 2017-09-08 MED ORDER — MEPERIDINE HCL 50 MG/ML IJ SOLN
INTRAMUSCULAR | Status: AC
  Filled 2017-09-08: qty 1

## 2017-09-08 MED ORDER — SODIUM CHLORIDE 0.9 % IV SOLN
INTRAVENOUS | Status: DC
  Administered 2017-09-08: 09:00:00 via INTRAVENOUS

## 2017-09-08 NOTE — Op Note (Signed)
[facility] Patient Name: Craig [name] Procedure Date: 09/08/2017 9:22 AM MRN: [id number] Date of Birth: Aug 26, 1961 Attending MD: Hildred Laser , MD CSN: [id number] Age: 56 Admit Type: Outpatient Procedure:                Colonoscopy Indications:              Rectal bleeding, Abnormal rectal exam, Change in                            bowel habits, Change in stool caliber Providers:                Hildred Laser, MD, Lurline Del, RN, Zoila Shutter,                            Technologist Referring MD:              Medicines:                Meperidine 50 mg IV, Midazolam 8 mg IV Complications:            No immediate complications                           . Estimated Blood Loss:     Estimated blood loss was minimal. Procedure:                Pre-Anesthesia Assessment:                           - Prior to the procedure, a History and Physical                            was performed, and patient medications and                            allergies were reviewed. The patient's tolerance of                            previous anesthesia was also reviewed. The risks                            and benefits of the procedure and the sedation                            options and risks were discussed with the patient.                            All questions were answered, and informed consent                            was obtained. Prior Anticoagulants: The patient has                            taken no previous anticoagulant or antiplatelet                            agents.  ASA Grade Assessment: I - A normal, healthy                            patient. After reviewing the risks and benefits,                            the patient was deemed in satisfactory condition to                            undergo the procedure.                           After obtaining informed consent, the colonoscope                            was passed under direct vision. Throughout the         procedure, the patient's blood pressure, pulse, and                            oxygen saturations were monitored continuously. The                            [id number] ([id number]) scope was introduced through                            the anus and advanced to the the cecum, identified                            by appendiceal orifice and ileocecal valve. The                            colonoscopy was somewhat difficult due to poor                            bowel prep with stool present. Successful                            completion of the procedure was aided by                            withdrawing and reinserting the scope and lavage.                            The patient tolerated the procedure well. The                            quality of the bowel preparation was fair. The                            ileocecal valve, appendiceal orifice, and rectum                            were photographed. Scope In: 9:50:19 AM Scope Out: 10:19:39 AM Scope Withdrawal  Time: 0 hours 19 minutes 49 seconds  Total Procedure Duration: 0 hours 29 minutes 20 seconds  Findings:      The perianal examination was normal.      The digital rectal exam revealed a large hard, fixed and nodular rectal       mass palpated 5.0 cm from the anal verge.      The sigmoid colon, descending colon, splenic flexure, transverse colon,       hepatic flexure, ascending colon, cecum, appendiceal orifice and       ileocecal valve appeared normal.      A polypoid and ulcerated partially obstructing large mass was found in       the rectum. The mass was circumferential. The mass measured five cm in       length. In addition, its diameter measured twelve mm. Oozing was       present. Biopsies were taken with a cold forceps for histology.      Retroflexion was not performed. Impression:               - Preparation of the colon was fair.                           - Rectal mass 5.0 cm from the anal verge.                            - The sigmoid colon, descending colon, splenic                            flexure, transverse colon, hepatic flexure,                            ascending colon, cecum, appendiceal orifice and                            ileocecal valve are normal.                           - Malignant partially obstructing tumor in the                            rectum. Biopsied. Moderate Sedation:      Moderate (conscious) sedation was administered by the endoscopy nurse       and supervised by the endoscopist. The following parameters were       monitored: oxygen saturation, heart rate, blood pressure, CO2       capnography and response to care. Total physician intraservice time was       34 minutes. Recommendation:           - Patient has a contact number available for                            emergencies. The signs and symptoms of potential                            delayed complications were discussed with the  patient. Return to normal activities tomorrow.                            Written discharge instructions were provided to the                            patient.                           - Low fiber diet today.                           - Continue present medications.                           - Await pathology results.                           - Repeat colonoscopy in 1 year for surveillance. Procedure Code(s):        --- Professional ---                           [phone], Colonoscopy, flexible; with biopsy, single                            or multiple                           99152, Moderate sedation services provided by the                            same physician or other qualified health care                            professional performing the diagnostic or                            therapeutic service that the sedation supports,                            requiring the presence of an independent trained                            observer  to assist in the monitoring of the                            patient's level of consciousness and physiological                            status; initial 15 minutes of intraservice time,                            patient age 81 years or older                           [phone], Moderate sedation services; each additional  15 minutes intraservice time Diagnosis Code(s):        --- Professional ---                           K62.89, Other specified diseases of anus and rectum                           C20, Malignant neoplasm of rectum                           K62.5, Hemorrhage of anus and rectum                           R19.4, Change in bowel habit                           R19.5, Other fecal abnormalities CPT copyright 2016 American Medical Association. All rights reserved. The codes documented in this report are preliminary and upon coder review may  be revised to meet current compliance requirements. Hildred Laser, MD Hildred Laser, MD 09/08/2017 10:36:43 AM This report has been signed electronically. Number of Addenda: 0

## 2017-09-08 NOTE — ED Triage Notes (Signed)
Patient had colonoscopy done this morning with Dr. Dorien Chihuahua and was told to come back if he saw in blood in his stool. Patient states he has a lot of dark red blood in his stool. He also states he is having pain in his rectum.  Patient states history of mass in large intestine.

## 2017-09-08 NOTE — ED Provider Notes (Signed)
[facility] EMERGENCY DEPARTMENT Provider Note   CSN: [id number] Arrival date & time: 09/08/17  1709     History   Chief Complaint Chief Complaint  Patient presents with  . Blood In Stools    HPI Craig [name] is a 56 y.o. male. Chief complaint is blood in stools  . HPI   this is a 56 year old male who is had a known rectal mass and intermittent GI bleeding from or than 8 months. He underwent outpatient colonoscopy today with Dr. Melony Overly and had biopsies of a rectal mass. He states he had some bleeding at home and "wasn't sure what to do". He feels well. He is not dizzy or lightheaded. He states this is no more bleeding that he has had in the day and he has had over the last few months  He denies being dizzy, lightheaded, presyncopal, no dyspnea   Past Medical History:  Diagnosis Date  . Anxiety    Truck driver  . Elevated serum creatinine    1.39 in 06/2012  . Palpitations    + chest pressure; PVCs  . Rectal bleeding 01/07/2017    Patient Active Problem List   Diagnosis Date Noted  . Rectal bleeding 01/07/2017  . Rectal mass 01/07/2017  . History of diagnostic tests 08/25/2012  . Palpitations   . Anxiety     History reviewed. No pertinent surgical history.     Home Medications    Prior to Admission medications   Medication Sig Start Date End Date Taking? Authorizing Provider  acetaminophen (TYLENOL) 325 MG tablet Take 650 mg by mouth every 6 (six) hours as needed for moderate pain or headache.     [provider]  diphenhydrAMINE (SOMINEX) 25 MG tablet Take 50 mg by mouth at bedtime as needed for sleep.    [provider]  docusate sodium (COLACE) 100 MG capsule Take 2 capsules (200 mg total) by mouth daily. 09/08/17   Rogene Houston, MD  hydrocortisone (ANUSOL-HC) 25 MG suppository Place 1 suppository (25 mg total) rectally 2 (two) times daily. 07/04/17   Joy, Shawn C, PA-C  ibuprofen (ADVIL,MOTRIN) 200 MG tablet Take 200-400 mg by mouth every  6 (six) hours as needed for headache or moderate pain.     [provider]  Naphazoline HCl (CLEAR EYES OP) Apply 1 drop to eye daily as needed (dry eyes).    [provider]  oxymetazoline (AFRIN) 0.05 % nasal spray Place 1 spray into both nostrils 2 (two) times daily as needed for congestion.    [provider]    Family History Family History  Problem Relation Age of Onset  . Colon cancer Neg Hx     Social History Social History  Substance Use Topics  . Smoking status: Former Research scientist (life sciences)  . Smokeless tobacco: Never Used     Comment: quit at age 80  . Alcohol use Yes     Comment: occ     Allergies   Patient has no known allergies.   Review of Systems Review of Systems  Constitutional: Negative for appetite change, chills, diaphoresis, fatigue and fever.  HENT: Negative for mouth sores, sore throat and trouble swallowing.   Eyes: Negative for visual disturbance.  Respiratory: Negative for cough, chest tightness, shortness of breath and wheezing.   Cardiovascular: Negative for chest pain.  Gastrointestinal: Positive for anal bleeding. Negative for abdominal distention, abdominal pain, diarrhea, nausea and vomiting.  Endocrine: Negative for polydipsia, polyphagia and polyuria.  Genitourinary: Negative for  dysuria, frequency and hematuria.  Musculoskeletal: Negative for gait problem.  Skin: Negative for color change, pallor and rash.  Neurological: Negative for dizziness, syncope, light-headedness and headaches.  Hematological: Does not bruise/bleed easily.  Psychiatric/Behavioral: Negative for behavioral problems and confusion.     Physical Exam Updated Vital Signs BP 126/85   Pulse 73   Temp 99.5 F (37.5 C) (Oral)   Resp 18   Ht 5\' 9"  (1.753 m)   Wt 83.9 kg (185 lb)   SpO2 99%   BMI 27.32 kg/m   Physical Exam  Constitutional: He is oriented to person, place, and time. He appears well-developed and well-nourished. No distress.  HENT:    Head: Normocephalic.  Eyes: Pupils are equal, round, and reactive to light. Conjunctivae are normal. No scleral icterus.  Conjunctiva not pale  Neck: Normal range of motion. Neck supple. No thyromegaly present.  Cardiovascular: Normal rate and regular rhythm.  Exam reveals no gallop and no friction rub.   No murmur heard. Pulmonary/Chest: Effort normal and breath sounds normal. No respiratory distress. He has no wheezes. He has no rales.  Abdominal: Soft. Bowel sounds are normal. He exhibits no distension. There is no tenderness. There is no rebound.  Musculoskeletal: Normal range of motion.  Neurological: He is alert and oriented to person, place, and time.  Skin: Skin is warm and dry. No rash noted.  Psychiatric: He has a normal mood and affect. His behavior is normal.     ED Treatments / Results  Labs (all labs ordered are listed, but only abnormal results are displayed) Labs Reviewed  COMPREHENSIVE METABOLIC PANEL - Abnormal; Notable for the following:       Result Value   Glucose, Bld 106 (*)    All other components within normal limits  CBC  TYPE AND SCREEN    EKG  EKG Interpretation None       Radiology No results found.  Procedures Procedures (including critical care time)  Medications Ordered in ED Medications - No data to display   Initial Impression / Assessment and Plan / ED Course  I have reviewed the triage vital signs and the nursing notes.  Pertinent labs & imaging results that were available during my care of the patient were reviewed by me and considered in my medical decision making (see chart for details).     Not tachypneic, tachycardic. Stable Hb.  Scant-minimal bleeding per description. Referred back to Dr. Laural Golden for further treatment guidelines. Return precautions discussed.  Final Clinical Impressions(s) / ED Diagnoses   Final diagnoses:  Lower GI bleed    New Prescriptions Discharge Medication List as of 09/08/2017  7:53 PM        Tanna Furry, MD 09/08/17 2047

## 2017-09-08 NOTE — H&P (Signed)
Craig [name] is an 56 y.o. male.   Chief Complaint: Patient is here for colonoscopy. HPI: Patient is 56 year old African male who presents with noted change in his bowel habits about 8 months ago. He became constipated. He also has been passing blood per rectum intermittently. Over the last 2 months he has noted decrease in caliber of his stools. He is generally having 1 bowel movement per year and he was seen in the office back in February 2018 and noted to have a mass. He has been scheduled for colonoscopy in March 2018 the procedure was canceled. He also complains of lump coming out, this bowel movement. He also complains of rectal pain. At times he feels like he will have a bowel movement and only passes his blood. He has good appetite. He has not lost any weight. Family history is negative for CRC.  Past Medical History:  Diagnosis Date  . Anxiety    Truck driver  . Elevated serum creatinine    1.39 in 06/2012  . Palpitations    + chest pressure; PVCs  . Rectal bleeding 01/07/2017    History reviewed. No pertinent surgical history.  Family History  Problem Relation Age of Onset  . Colon cancer Neg Hx    Social History:  reports that he has quit smoking. He has never used smokeless tobacco. He reports that he drinks alcohol. He reports that he does not use drugs.  Allergies: No Known Allergies  Medications Prior to Admission  Medication Sig Dispense Refill  . acetaminophen (TYLENOL) 325 MG tablet Take 650 mg by mouth every 6 (six) hours as needed for moderate pain or headache.     . hydrocortisone (ANUSOL-HC) 25 MG suppository Place 1 suppository (25 mg total) rectally 2 (two) times daily. 12 suppository 0  . Naphazoline HCl (CLEAR EYES OP) Apply 1 drop to eye daily as needed (dry eyes).    Marland Kitchen oxymetazoline (AFRIN) 0.05 % nasal spray Place 1 spray into both nostrils 2 (two) times daily as needed for congestion.    . diphenhydrAMINE (SOMINEX) 25 MG tablet Take 50 mg by mouth at  bedtime as needed for sleep.    Marland Kitchen ibuprofen (ADVIL,MOTRIN) 200 MG tablet Take 200-400 mg by mouth every 6 (six) hours as needed for headache or moderate pain.       Results for orders placed or performed in visit on 09/07/17 (from the past 48 hour(s))  CEA     Status: Abnormal   Collection Time: 09/07/17 11:41 AM  Result Value Ref Range   CEA 58.0 (H) ng/mL    Comment: Non-Smoker: <2.5 Smoker:     <5.0 . . This test was performed using the Siemens  chemiluminescent method. Values obtained from different assay methods cannot be used interchangeably. CEA levels, regardless of value, should not be interpreted as absolute evidence of the presence or absence of disease. .    No results found.  ROS  Blood pressure (!) 136/95, pulse (!) 101, temperature 99.3 F (37.4 C), temperature source Oral, resp. rate 13, height 5\' 9"  (1.753 m), weight 185 lb (83.9 kg), SpO2 97 %. Physical Exam  Constitutional: He appears well-developed and well-nourished.  HENT:  Mouth/Throat: Oropharynx is clear and moist.  Eyes: Conjunctivae are normal. No scleral icterus.  Neck: No tracheal deviation present. No thyromegaly present.  Cardiovascular: Normal rate, regular rhythm and normal heart sounds.   No murmur heard. Respiratory: Effort normal and breath sounds normal.  GI: Soft. He exhibits no distension  and no mass. There is no tenderness.  Musculoskeletal: He exhibits no edema.  Lymphadenopathy:    He has no cervical adenopathy.  Neurological: He is alert.  Skin: Skin is warm.     Assessment/Plan Change in bowel habits rectal bleeding and rectal mass noted on digital exam. Diagnostic colonoscopy.  Hildred Laser, MD 09/08/2017, 9:39 AM

## 2017-09-08 NOTE — Discharge Instructions (Signed)
Resume usual medications as before. Colace 20 mg by mouth daily at bedtime. Resume usual diet. No driving for 24 hours. Physician will call with biopsy results.   Colonoscopy, Adult, Care After This sheet gives you information about how to care for yourself after your procedure. Your doctor may also give you more specific instructions. If you have problems or questions, call your doctor. Follow these instructions at home: General instructions   For the first 24 hours after the procedure: ? Do not drive or use machinery. ? Do not sign important documents. ? Do not drink alcohol. ? Do your daily activities more slowly than normal. ? Eat foods that are soft and easy to digest. ? Rest often.  Take over-the-counter or prescription medicines only as told by your doctor.  It is up to you to get the results of your procedure. Ask your doctor, or the department performing the procedure, when your results will be ready. To help cramping and bloating:  Try walking around.  Put heat on your belly (abdomen) as told by your doctor. Use a heat source that your doctor recommends, such as a moist heat pack or a heating pad. ? Put a towel between your skin and the heat source. ? Leave the heat on for 20-30 minutes. ? Remove the heat if your skin turns bright red. This is especially important if you cannot feel pain, heat, or cold. You can get burned. Eating and drinking  Drink enough fluid to keep your pee (urine) clear or pale yellow.  Return to your normal diet as told by your doctor. Avoid heavy or fried foods that are hard to digest.  Avoid drinking alcohol for as long as told by your doctor. Contact a doctor if:  You have blood in your poop (stool) 2-3 days after the procedure. Get help right away if:  You have more than a small amount of blood in your poop.  You see large clumps of tissue (blood clots) in your poop.  Your belly is swollen.  You feel sick to your stomach  (nauseous).  You throw up (vomit).  You have a fever.  You have belly pain that gets worse, and medicine does not help your pain. This information is not intended to replace advice given to you by your health care provider. Make sure you discuss any questions you have with your health care provider. Document Released: 12/04/2010 Document Revised: 07/26/2016 Document Reviewed: 07/26/2016 Elsevier Interactive Patient Education  2017 [employer].

## 2017-09-08 NOTE — Discharge Instructions (Signed)
Call Dr. Laural Golden tomorrow for an update. Return to ER with worsening bleeding, or dizziness, lightheadedness, worsening symptoms.

## 2017-09-09 ENCOUNTER — Other Ambulatory Visit (INDEPENDENT_AMBULATORY_CARE_PROVIDER_SITE_OTHER): Payer: Self-pay | Admitting: Internal Medicine

## 2017-09-09 DIAGNOSIS — C20 Malignant neoplasm of rectum: Secondary | ICD-10-CM

## 2017-09-12 ENCOUNTER — Encounter (HOSPITAL_COMMUNITY): Payer: Self-pay | Admitting: Internal Medicine

## 2017-09-15 ENCOUNTER — Ambulatory Visit (HOSPITAL_COMMUNITY)
Admission: RE | Admit: 2017-09-15 | Discharge: 2017-09-15 | Disposition: A | Discharge status: Home / Self Care | Payer: BLUE CROSS/BLUE SHIELD | Source: Ambulatory Visit | Attending: Internal Medicine | Admitting: Internal Medicine

## 2017-09-15 DIAGNOSIS — C772 Secondary and unspecified malignant neoplasm of intra-abdominal lymph nodes: Secondary | ICD-10-CM | POA: Insufficient documentation

## 2017-09-15 DIAGNOSIS — C20 Malignant neoplasm of rectum: Secondary | ICD-10-CM | POA: Insufficient documentation

## 2017-09-15 DIAGNOSIS — R911 Solitary pulmonary nodule: Secondary | ICD-10-CM | POA: Diagnosis not present

## 2017-09-15 DIAGNOSIS — I700 Atherosclerosis of aorta: Secondary | ICD-10-CM | POA: Diagnosis not present

## 2017-09-15 DIAGNOSIS — N200 Calculus of kidney: Secondary | ICD-10-CM | POA: Diagnosis not present

## 2017-09-15 MED ORDER — IOPAMIDOL (ISOVUE-300) INJECTION 61%
100.0000 mL | Freq: Once | INTRAVENOUS | Status: AC | PRN
  Administered 2017-09-15: 100 mL via INTRAVENOUS

## 2017-09-16 ENCOUNTER — Other Ambulatory Visit (INDEPENDENT_AMBULATORY_CARE_PROVIDER_SITE_OTHER): Payer: Self-pay | Admitting: Internal Medicine

## 2017-09-16 ENCOUNTER — Telehealth (INDEPENDENT_AMBULATORY_CARE_PROVIDER_SITE_OTHER): Payer: Self-pay | Admitting: Internal Medicine

## 2017-09-16 MED ORDER — HYDROCODONE-ACETAMINOPHEN 5-325 MG PO TABS: 1.0000 | ORAL_TABLET | Freq: Four times a day (QID) | ORAL | 0 refills | Status: DC | PRN

## 2017-09-16 NOTE — Telephone Encounter (Signed)
Talked with Dr.Rehman. He ask that the patient and his sister come to [facility]/outpatient, where patient came for his procedure. At this time he will review the results and give the patient something for discomfort.  I have called and left messages for both the patient and his sister.

## 2017-09-16 NOTE — Telephone Encounter (Signed)
Patient called, lmoam that he was calling for his biopsy results.  He also stated that he had a CT done yesterday.  He stated that he is still having pain in his rectum and the Tylenol is not enough, it's affecting him while he is working.  [phone]

## 2017-09-18 NOTE — Telephone Encounter (Signed)
Prescription for Vicodin 5/325 1 every 6 as needed given to patient's cousin for 40 doses 2 days ago. Will make an appointment for patient to see Dr. Nadeen Landau next week. Lelon Frohlich, I will call Dr. Mariel Kansky at [facility] surgery, [place] may want to send consultation request,

## 2017-09-19 NOTE — Telephone Encounter (Signed)
appt sch'd 09/23/17 at 315, patient aware

## 2017-09-23 ENCOUNTER — Ambulatory Visit: Payer: Self-pay | Admitting: Surgery

## 2017-09-23 NOTE — H&P (Signed)
History of Present Illness Craig [name] M. Craig Dann MD; 09/23/2017 4:44 PM) Patient words: CC:Here for new rectal cancer  HPI: Craig [name] is a very pleasant 56 year old male referred to me by Dr. Laural Golden for evaluation of newly diagnosed rectal cancer.  He underwent his first ever colonoscopy 09/08/17.  Findings were remarkable for a partially obstructing tumor in the rectum approximate 5 cm needle verge by endoscopic evaluation.  This is biopsied and returned invasive adenocarcinoma.  The rest of his endoscopic exam was unremarkable.  He subsequently underwent staging CT chest/abdomen/pelvis which was notable for a bulky rectal mass with perirectal nodal metastases, nonspecific right upper lobe nodule but no other evidence of distant metastatic disease.  He was then referred to [place] for further evaluation and consideration of possible diversion.  He denies any obstructive symptoms today- he is tolerating a diet without nausea or vomiting.  He is having multiple bowel limits per day. He does note rectal symptoms - primairly the sensation that his rectum is full and he needs to evacuate.  He was previously taking MiraLAX once a day but didn't think this changed anything so he stopped.  He does have occasional mucoid and blood per rectum.  He denies any significant weight changes.  PMH: Denies any prior medical history. PSH: Denies any prior surgical history on the abdomen. FHx: Denies FHx of malignancy SHx: Denies the use of tobacco/EtOH/drugs.  The patient is a 56 year old male.   Diagnostic Studies History Craig [name], [place]; 09/23/2017 3:42 PM) Colonoscopy   within last year  Allergies Craig [name], RMA; 09/23/2017 3:43 PM) No Known Drug Allergies  09/23/2017 Allergies Reconciled    Medication History Craig [name], [place]; 09/23/2017 3:45 PM) Hydrocodone-Acetaminophen  (5-325MG  Tablet, Oral) Active. Diphenhist  (25MG  Tablet, 2 TAB Oral) Active. Ibuprofen  (400MG  Tablet, Oral) Active. Naphcon-A   (0.025-0.3% Solution, Ophthalmic) Active. Oxymeta-12  (0.05% Solution, Nasal) Active. Medications Reconciled   Social History Craig [name], [place]; 09/23/2017 3:42 PM) Alcohol use   Occasional alcohol use. No caffeine use   No drug use   Tobacco use   Former smoker.  Family History Craig [name], [place]; 09/23/2017 3:42 PM) Cancer   Mother. Colon Cancer   Mother.  Other Problems Craig [name], [place]; 09/23/2017 3:42 PM) Colon Cancer   Kidney Stone      Review of Systems Craig [name] M. Craig Timmons MD; 09/23/2017 4:18 PM) General Not Present- Anorexia, Appetite Loss and Chills. Skin Not Present- Brittle Nails and Bruising. HEENT Not Present- Double Vision and Headache. Neck Not Present- Neck Mass and Neck Pain. Respiratory Not Present- Bloody sputum, Chronic Cough and Decreased Exercise Tolerance. Cardiovascular Not Present- Chest Pain and Difficulty Breathing Lying Down. Gastrointestinal Present- Bloody Stool, Change in Bowel Habits, Difficulty Swallowing, Excessive gas, Gets full quickly at meals and Rectal Pain. Not Present- Abdominal Pain, Bloating, Chronic diarrhea, Constipation, Hemorrhoids, Indigestion, Nausea and Vomiting. Male Genitourinary Not Present- Blood in Urine and Change in Urinary Stream. Musculoskeletal Not Present- Back Pain and Backache. Neurological Not Present- Decreased Memory and Difficulty Speaking. Psychiatric Not Present- Anxiety and Depression. Endocrine Not Present- Appetite Changes and New Diabetes. Hematology Not Present- Easy Bleeding and Easy Bruising.  Vitals Craig [name] RMA; 09/23/2017 3:43 PM) 09/23/2017 3:42 PM Weight: 184.2 lb   Height: 69 in  Body Surface Area: 1.99 m   Body Mass Index: 27.2 kg/m   Temp.: 99 F    Pulse: 80 (Regular)    BP: 140/80 (Sitting, Left Arm, Standard)       Physical  Exam Craig [name] M. Craig Kendra MD; 09/23/2017 4:19 PM) The physical exam findings are as follows: Note: Constitutional: No acute distress, conversant, no  deformities Eyes: Moist conjunctiva, no lid lag, anicteric, PERRL Neck: Trachea midline; no palpable thyromegaly Lungs: Normal respiratory effort; no tactile fremitus CV: RRR; no palpable thrills; no pitting edema GI: Abdomen soft, NT; no palpable hepatosplenomegaly Anorectal: No external lesions; rectal mass palpable near circumferental, mobile ~5-6cm from anal verge by palpation. MSK: Normal gait; no clubbing/cyanosis Psych: Appropriate affect; alert and oriented x3 Lymphatic: No palpable cervical or axillary lymphadenopathy    Assessment & Plan Craig [name] M. Craig Shreffler MD; 09/23/2017 4:49 PM) Unspecified Diagnosis Current Plans Started Gabapentin 300MG , 1 (one) tablet three times daily, #90, 30 days starting 09/23/2017, Ref. x2. RECTAL CANCER (C20) Impression: 26M with newly diagnosed rectal cancer -Obtain MRI pelvis with and without contrast, indication: rectal cancer local staging -Referral to medical oncology -Referral to radiation oncology -Present at [facility] -His rectal-related symptoms appear to be more likely to be originating from his bulky tumor and potential perirectal nerve involvement. He has not described to me any obstructive type symptoms and is having multiple bowel movements per day. His CT scan shows a normal caliber colon without evidence of obstruction/chronic obstruction. I have recommended he resume MiraLAX potentially even taking it twice daily until his stools are somewhat loose and if necessary than backing down to once daily. Additionally I will prescribe him gabapentin to help with what is likely neuropathic types of pain -The diagnosis of rectal cancer was discussed. In any physiology of the GI tract was described in detail using pictures and diagrams. The multimodal approach to the treatment of rectal cancer was also discussed. We discussed the rationale for local staging of the MRI. We discussed the probable need for chemoradiation therapy followed by surgery. If on  MRI he does have pathologic appearing lymph nodes, regardless of the mucosal response, he would not be a person I would consider for watchful waiting. We discussed that in the ideal scenario, following chemotherapy and radiation therapy he would need surgery to further address his pathology. The potential approaches and details regarding surgery will be discussed with him at a later date -His questions were answered  Sharon Mt. Dema Severin, ObrienD. General and Colorectal Surgery [facility]

## 2017-09-27 ENCOUNTER — Other Ambulatory Visit: Payer: Self-pay | Admitting: Surgery

## 2017-09-27 ENCOUNTER — Encounter: Payer: Self-pay | Admitting: Radiation Oncology

## 2017-09-27 DIAGNOSIS — C20 Malignant neoplasm of rectum: Secondary | ICD-10-CM

## 2017-09-28 NOTE — Progress Notes (Signed)
GI Location of Tumor / Histology: Rectal Cancer  Craig [name] presented  months ago with symptoms of: Lower Gi bleed and pain past 8 months, also c/o in 01/07/17,failed to show to  have colonoscopy on 01/21/17,  Biopsies of (if applicable) revealed: Diagnosis 10/25/218: 1st colonoscopy  Rectum, biopsy - INVASIVE COLORECTAL ADENOCARCINOMA.  Past/Anticipated interventions by surgeon, if any: Dr. Nadeen Landau 09/08/17: colonoscopy with biopsy   Past/Anticipated interventions by medical oncology, if any:   Weight changes, if any:  Bowel/Bladder complaints, if any: constipation,pain when having bm, on Miralax  Nausea / Vomiting, if any:   Pain issues, if any:    Any blood per rectum:    SAFETY ISSUES:  Prior radiation? NO  Pacemaker/ICD? NO  Is the patient on methotrexate?   Current Complaints/Details: Anxiety, hx kidney stones, former smoker, occasional alcohol use, no drug use Mother colon cancer,  Allergies:NKA

## 2017-09-28 NOTE — Progress Notes (Signed)
  Oncology Nurse Navigator Documentation  Navigator Location: [facility] (09/28/17 1351) Referral date to RadOnc/MedOnc: 09/27/17 (09/28/17 1351) )Navigator Encounter Type: Introductory phone call (09/28/17 1351)   Abnormal Finding Date: 09/08/17 (09/28/17 1351) Confirmed Diagnosis Date: 09/08/17 (09/28/17 1351)                 Treatment Phase: Pre-Tx/Tx Discussion (09/28/17 1351) Barriers/Navigation Needs: Coordination of Care (09/28/17 1351)   Interventions: Coordination of Care;Psycho-social support (09/28/17 1351)  Patient scheduled to see Dr. Benay Spice on 09/30/17 @ 10:30 AM.  I called patient and left VM sharing date and time of appointment. I also introduced myself and my role as GI Navigator. Patient encouraged to call back with questions or concerns.          Acuity: Level 1 (09/28/17 1351) Acuity Level 1: Initial guidance, education and coordination as needed (09/28/17 1351)       Time Spent with Patient: 15 (09/28/17 1351)

## 2017-09-29 ENCOUNTER — Telehealth: Payer: Self-pay | Admitting: *Deleted

## 2017-09-29 ENCOUNTER — Ambulatory Visit
Admission: RE | Admit: 2017-09-29 | Discharge: 2017-09-29 | Disposition: A | Discharge status: Home / Self Care | Payer: Medicaid Other | Source: Ambulatory Visit | Attending: Radiation Oncology | Admitting: Radiation Oncology

## 2017-09-29 ENCOUNTER — Ambulatory Visit: Payer: Medicaid Other

## 2017-09-29 ENCOUNTER — Ambulatory Visit: Payer: Medicaid Other | Admitting: Radiation Oncology

## 2017-09-29 DIAGNOSIS — Z9889 Other specified postprocedural states: Secondary | ICD-10-CM | POA: Insufficient documentation

## 2017-09-29 DIAGNOSIS — Z87891 Personal history of nicotine dependence: Secondary | ICD-10-CM | POA: Insufficient documentation

## 2017-09-29 DIAGNOSIS — K5909 Other constipation: Secondary | ICD-10-CM | POA: Insufficient documentation

## 2017-09-29 DIAGNOSIS — Z801 Family history of malignant neoplasm of trachea, bronchus and lung: Secondary | ICD-10-CM | POA: Insufficient documentation

## 2017-09-29 DIAGNOSIS — Z510 Encounter for antineoplastic radiation therapy: Secondary | ICD-10-CM | POA: Insufficient documentation

## 2017-09-29 DIAGNOSIS — C20 Malignant neoplasm of rectum: Secondary | ICD-10-CM | POA: Insufficient documentation

## 2017-09-29 NOTE — Telephone Encounter (Signed)
Called and left vm, pt appt 730am, asked tocall back if traffic  Slow or if not coming to call  7:50 AM

## 2017-09-29 NOTE — Telephone Encounter (Signed)
Called patient again this am, patient answered and said to reschedule that he and called to cancel around 6 or 7, transferred call to [facility] scheduler and called Ct sim, spoke Katie RT therapist and gave cncellation 8:28 AM

## 2017-09-30 ENCOUNTER — Ambulatory Visit (HOSPITAL_BASED_OUTPATIENT_CLINIC_OR_DEPARTMENT_OTHER): Payer: Self-pay | Admitting: Oncology

## 2017-09-30 ENCOUNTER — Encounter: Payer: Self-pay | Admitting: Oncology

## 2017-09-30 ENCOUNTER — Other Ambulatory Visit: Payer: Self-pay | Admitting: Medical Oncology

## 2017-09-30 ENCOUNTER — Telehealth: Payer: Self-pay | Admitting: Oncology

## 2017-09-30 ENCOUNTER — Telehealth: Payer: Self-pay | Admitting: Medical Oncology

## 2017-09-30 ENCOUNTER — Telehealth: Payer: Self-pay | Admitting: Pharmacist

## 2017-09-30 VITALS — BP 130/89 | HR 70 | Temp 98.4°F | Resp 18 | Ht 69.0 in | Wt 183.4 lb

## 2017-09-30 DIAGNOSIS — C20 Malignant neoplasm of rectum: Secondary | ICD-10-CM

## 2017-09-30 DIAGNOSIS — K625 Hemorrhage of anus and rectum: Secondary | ICD-10-CM

## 2017-09-30 DIAGNOSIS — G893 Neoplasm related pain (acute) (chronic): Secondary | ICD-10-CM

## 2017-09-30 DIAGNOSIS — K6289 Other specified diseases of anus and rectum: Secondary | ICD-10-CM

## 2017-09-30 HISTORY — DX: Malignant neoplasm of rectum: C20

## 2017-09-30 MED ORDER — CAPECITABINE 500 MG PO TABS: ORAL_TABLET | ORAL | 0 refills | Status: DC

## 2017-09-30 MED ORDER — OXYCODONE HCL 5 MG PO TABS: 5.0000 mg | ORAL_TABLET | ORAL | 0 refills | Status: DC | PRN

## 2017-09-30 MED FILL — oxyCODONE HCL 5 MG TABS: 5 | 5 days supply | Qty: 60 | Fill #0

## 2017-09-30 NOTE — Telephone Encounter (Signed)
Gave patient avs and calendar with appt per 11/16 los.

## 2017-09-30 NOTE — Addendum Note (Signed)
Addended by: Jethro Bolus A on: 09/30/2017 03:20 PM   Modules accepted: Orders

## 2017-09-30 NOTE — Progress Notes (Signed)
  Oncology Nurse Navigator Documentation  Navigator Location: [facility] (09/30/17 1220)   )Navigator Encounter Type: Initial MedOnc (09/30/17 1220)                       Treatment Phase: Pre-Tx/Tx Discussion (09/30/17 1220) Barriers/Navigation Needs: Financial (09/30/17 1220)   Interventions: Referrals (09/30/17 1220) Referrals: Social Work (09/30/17 1220)  Met with patient and Dr. Benay Spice during med/onc visit. Patient states that his insurance ended 09/14/17 and he went to [employer] and applied for [employer]. Referral placed to social work for needs assessment. 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  I reviewed bowel protocol with patient for constipation and reviewed his upcoming appointments. With him. Patient advised that he may be receiving multiple calls from [facility] to coordinate start of care. Patient verbalized understanding that he can call me with any questions or concerns.        Acuity: Level 2 (09/30/17 1220)         Time Spent with Patient: 30 (09/30/17 1220)

## 2017-09-30 NOTE — Progress Notes (Signed)
  Oncology Nurse Navigator Documentation  Navigator Location: [facility] (09/30/17 1050)   )Navigator Encounter Type: Telephone (09/30/17 1050) Telephone: [place] Call (09/30/17 1050)                       Barriers/Navigation Needs: Coordination of Care (09/30/17 1050)   Interventions: Coordination of Care (09/30/17 1050)  I called and left VM making patient aware that his MRI - pelvis has been scheduled for 10/07/17 @ 9AM. Patient to arrive at 8:45 AM. I left my contact information and requested that patient call me back with any questions or concerns.          Acuity: Level 2 (09/30/17 1050)         Time Spent with Patient: 15 (09/30/17 1050)

## 2017-09-30 NOTE — Telephone Encounter (Signed)
Oral Oncology Pharmacist Encounter  Received new prescription for Xeloda (capecitabine) for the neoadjuvant treatment of advanced rectal cancer in conjunction with radiation, planned duration 5-6 weeks of therapy.  Labs from 09/08/17 assessed, OK for treatment.  Current medication list in Epic reviewed, no DDIs with Xeloda identified.  Prescription has been e-scribed to the [facility] for benefits analysis and approval. Noted patient without prescription insurance coverage.   I spoke with patient in exam room for overview of: Xeloda  Pt is doing well. Counseled patient on administration, dosing, side effects, monitoring, drug-food interactions, safe handling, storage, and disposal.  Patient will take Xeloda 500mg  tablets, 4 tablets (2000mg ) by mouth in AM and 3 tabs (1500mg ) by mouth in PM, within 30 minutes of finishing meals, on days of radiation only. Xeloda and radiation start date: TBD, CT simulation on 11/20, hopeful start 11/26  Side effects include but not limited to: fatigue, decreased blood counts, GI upset, diarrhea, and hand-foot syndrome. Patient has loperamide at home and will call the office if diarrhea develops.    Reviewed with patient importance of keeping a medication schedule and plan for any missed doses.  [name] voiced understanding and appreciation.   All questions answered.  Patient will bring financial documents to CT sim appt on 11/20 and ask to speak with financial advocate in radiation about signing up for [employer].  Medication reconciliation performed and medication/allergy list updated.  Patient knows to call the office with questions or concerns. Oral Oncology Clinic will continue to follow.  Thank you,  Johny Drilling, PharmD, BCPS, BCOP 09/30/2017  5:00 PM Oral Oncology Clinic [phone]

## 2017-09-30 NOTE — Progress Notes (Signed)
Oncology Navigator Note:  I called Central scheduling and scheduled patient's MRI of the pelvis for 10/07/17 @ 9 AM. Patient is to arrive at 8:45 for his 9 AM appointment. I will let patient know of this appointment at his med/onc appointment this AM.

## 2017-09-30 NOTE — Progress Notes (Signed)
[name]   Referring MD: Ileana Roup, [address], [place] [address]   Craig [name] 56 y.o.  December 06, 1960    Reason for Referral: Rectal cancer   HPI: Craig [name] reports rectal bleeding for the past 6-7 months.  He was scheduled for colonoscopy, but this was canceled due to loss of medical insurance.  He was referred to Dr. Laural Golden and was taken to a colonoscopy on 09/08/2017.  A hard fixed mass was noted on palpation at 5 cm from the anal verge.  On colonoscopy a partially obstructing mass was found in the rectum.  The mass measured 5 cm in length.  Oozing was present.  A biopsy was obtained. The pathology [id number]) revealed invasive colorectal adenocarcinoma.  He was referred to Dr. Dema Severin for surgical planning.  He is scheduled for a staging pelvic MRI 10/07/2017. CTs of the chest, abdomen, and pelvis on 09/15/2018 revealed a 3 mm right upper lobe nodule, normal liver, multiple renal stones, and a rectal mass.  No bowel obstruction.  Perirectal nodes are noted.  No evidence of omental or peritoneal disease.  He has persistent rectal bleeding.  He complains of pain at the rectum.  The pain is not relieved with gabapentin or hydrocodone.  He takes Tylenol multiple times per day.   Past Medical History:  Diagnosis Date  . Anxiety    Truck driver  . Elevated serum creatinine    1.39 in 06/2012  . Palpitations    + chest pressure; PVCs  . Rectal bleeding 01/07/2017    .   Rectal cancer                                                                                                                    09/08/2017  Past Surgical History:  Procedure Laterality Date  . COLONOSCOPY N/A 09/08/2017   Performed by Rogene Houston, MD at [facility]    Medications: Reviewed  Allergies: No Known Allergies  Family history: His mother had lung cancer and was a smoker.  A maternal cousin had prostate cancer.  His sister has "cancer  ".  No family history of colorectal cancer.  Social History:   He lives alone in [place].  He works as a Programmer, systems.  He quit smoking cigarettes at age 12.  He reports rare alcohol use.  He has 3 daughters and 1 son.  No risk factor for HIV or hepatitis.  ROS:   Positives include: Pain at the rectum, rectal bleeding, intermittent "tingling "in the extremities, constipation, pain with bowel movements  A complete ROS was otherwise negative.  Physical Exam:  Blood pressure 130/89, pulse 70, temperature 98.4 F (36.9 C), temperature source Oral, resp. rate 18, height _0  (1.753 m), weight 183 lb 6.4 oz (83.2 kg), SpO2 99 %.  HEENT: Oropharynx without visible mass, neck without mass Lungs: Clear bilaterally Cardiac: Regular rate and rhythm Abdomen: No hepatosplenomegaly, no mass, nontender, rectal  exam deferred secondary to multiple recent examinations GU: Testes without mass Vascular: No leg edema Lymph nodes: No cervical, supraclavicular, or inguinal nodes.  "Shotty "bilateral axillary nodes Neurologic: Alert and oriented, the motor exam appears intact in the upper and lower extremities Skin: No rash Musculoskeletal: No spine tenderness   LAB:  CBC  Lab Results  Component Value Date   WBC 6.3 09/08/2017   HGB 13.8 09/08/2017   HCT 41.3 09/08/2017   MCV 88.8 09/08/2017   PLT 256 09/08/2017   NEUTROABS 2.5 07/04/2017        CMP     Component Value Date/Time   NA 140 09/08/2017 1812   K 3.6 09/08/2017 1812   CL 107 09/08/2017 1812   CO2 23 09/08/2017 1812   GLUCOSE 106 (H) 09/08/2017 1812   BUN 12 09/08/2017 1812   CREATININE 1.05 09/08/2017 1812   CALCIUM 9.0 09/08/2017 1812   PROT 7.4 09/08/2017 1812   ALBUMIN 3.6 09/08/2017 1812   AST 28 09/08/2017 1812   ALT 40 09/08/2017 1812   ALKPHOS 105 09/08/2017 1812   BILITOT 0.6 09/08/2017 1812   GFRNONAA >60 09/08/2017 1812   GFRAA >60 09/08/2017 1812   CEA on 09/07/2017:  58    Imaging:  CT images from 09/15/2017-reviewed   Assessment/Plan:   1. Rectal cancer  Colonoscopy 09/08/2017, rectal mass at 5 cm from the anal verge, biopsy confirmed invasive adenocarcinoma  Staging CTs 09/15/2017, rectal mass and perirectal lymphadenopathy, indeterminate right upper lobe nodule  Elevated CEA  2.  Pain and bleeding secondary to #1   Disposition:   Craig [name] has been diagnosed with rectal cancer.  He appears to have locally advanced disease based on the staging evaluation to date.  His case was presented at the GI tumor conference on 09/28/2017.  The consensus opinion is to proceed with neoadjuvant chemotherapy/radiation.  He is scheduled for a restaging pelvic MRI next week.  I recommend concurrent capecitabine and radiation.  We reviewed the potential toxicities associated with capecitabine including the chance for nausea/vomiting, mucositis, diarrhea, and hematologic toxicity.  We discussed the rash, hyperpigmentation, sun sensitivity, and hand/foot syndrome associated with capecitabine.  He will attend a chemotherapy teaching class.  Craig [name] is scheduled for radiation simulation and a chemotherapy teaching class on 10/04/2017.  The plan is to begin concurrent chemotherapy/radiation 10/10/2017.  He met with the Cancer center pharmacist today.  Craig [name] reports his pain is not relieved with gabapentin and hydrocodone.  He will discontinue these medications. He will begin oxycodone as needed for pain.  He will continue Tylenol as needed.  He knows to not drive while taking narcotics.  He will continue Colace and begin MiraLAX.  He will return for an office and lab visit on 10/20/2017.  50 minutes were spent with the patient today.  The majority of the time was used for counseling and coordination of care.    Betsy Coder, MD  09/30/2017, 11:22 AM

## 2017-09-30 NOTE — Telephone Encounter (Signed)
err

## 2017-10-04 ENCOUNTER — Ambulatory Visit
Admission: RE | Admit: 2017-10-04 | Discharge: 2017-10-04 | Disposition: A | Discharge status: Home / Self Care | Payer: Medicaid Other | Source: Ambulatory Visit | Attending: Radiation Oncology | Admitting: Radiation Oncology

## 2017-10-04 ENCOUNTER — Encounter: Payer: Self-pay | Admitting: *Deleted

## 2017-10-04 ENCOUNTER — Other Ambulatory Visit: Payer: Self-pay

## 2017-10-04 ENCOUNTER — Encounter: Payer: Self-pay | Admitting: Radiation Oncology

## 2017-10-04 ENCOUNTER — Telehealth: Payer: Self-pay | Admitting: *Deleted

## 2017-10-04 VITALS — BP 124/91 | HR 73 | Temp 98.4°F | Resp 20 | Ht 69.0 in | Wt 179.2 lb

## 2017-10-04 DIAGNOSIS — C20 Malignant neoplasm of rectum: Secondary | ICD-10-CM

## 2017-10-04 DIAGNOSIS — Z809 Family history of malignant neoplasm, unspecified: Secondary | ICD-10-CM

## 2017-10-04 DIAGNOSIS — K5909 Other constipation: Secondary | ICD-10-CM | POA: Diagnosis not present

## 2017-10-04 DIAGNOSIS — Z87891 Personal history of nicotine dependence: Secondary | ICD-10-CM | POA: Diagnosis not present

## 2017-10-04 DIAGNOSIS — Z801 Family history of malignant neoplasm of trachea, bronchus and lung: Secondary | ICD-10-CM | POA: Diagnosis not present

## 2017-10-04 DIAGNOSIS — Z510 Encounter for antineoplastic radiation therapy: Secondary | ICD-10-CM | POA: Diagnosis not present

## 2017-10-04 DIAGNOSIS — Z9889 Other specified postprocedural states: Secondary | ICD-10-CM | POA: Diagnosis not present

## 2017-10-04 NOTE — Progress Notes (Signed)
Please see the Nurse Progress Note in the MD Initial Consult Encounter for this patient. 

## 2017-10-04 NOTE — Progress Notes (Signed)
Radiation Oncology         [phone] ________________________________  Name: Craig [name]        MRN: [id number]  Date of Service: 10/04/2017 DOB: 05-10-1961  TI:[id number], No Pcp Per  Ileana Roup, MD     REFERRING PHYSICIAN: Ileana Roup, MD   DIAGNOSIS: The encounter diagnosis was Rectal cancer [facility]).   HISTORY OF PRESENT ILLNESS: Craig [name] is a 56 y.o. male seen at the request of Dr. Dema Severin. The patient was seen for difficulty with bowel function by Dr. Laural Golden and underwent colonoscopy on 09/08/17 where a near obstructing mass was noted 4-5 cm from the anal verge within the distal rectum. A biopsy of this site was obtained and revealed invasive adenocarcinoma. He underwent staging CT scans, and has thickening along the rectum that terminates about 5 cm from the anal verge consistent with his known tumor, and no bowel obstruction. He did not appear to be radiographically obstructed, though there was some mild dilitation of the colon diffusely. The length of the tumor was about 7 cm, and there is one mildly enlarged perierectal lymph node. He met with Dr. Dema Severin in [place], and is being referred to consider chemoRT followed by surgical resection. The patient is in the process of being set up as well for MRI of the pelvis to complete his staging work up this Friday. He comes today to discuss options of radiotherapy, and met with Dr. Benay Spice last Friday.     PREVIOUS RADIATION THERAPY: No   PAST MEDICAL HISTORY:  Past Medical History:  Diagnosis Date  . Anxiety    Truck driver  . Elevated serum creatinine    1.39 in 06/2012  . Palpitations    + chest pressure; PVCs  . Rectal bleeding 01/07/2017  . Rectal cancer ([place]) 09/30/2017       PAST SURGICAL HISTORY: Past Surgical History:  Procedure Laterality Date  . COLONOSCOPY N/A 09/08/2017   Procedure: COLONOSCOPY;  Surgeon: Rogene Houston, MD;  Location: [facility];  Service: Endoscopy;  Laterality: N/A;      FAMILY HISTORY:  Family History  Problem Relation Age of Onset  . Cancer Mother        Lung cancer     SOCIAL HISTORY:  reports that he has quit smoking. he has never used smokeless tobacco. He reports that he drinks alcohol. He reports that he does not use drugs. 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 He is currently on a leave of absence from work. He is not eligible for FMLA as he has only been working there for four months.  ALLERGIES: Patient has no known allergies.   MEDICATIONS:  Current Outpatient Medications  Medication Sig Dispense Refill  . docusate sodium (COLACE) 100 MG capsule Take 2 capsules (200 mg total) by mouth daily. 60 capsule 0  . ibuprofen (ADVIL,MOTRIN) 200 MG tablet Take 200-400 mg by mouth every 6 (six) hours as needed for headache or moderate pain.     . Naphazoline HCl (CLEAR EYES OP) Apply 1 drop to eye daily as needed (dry eyes).    Marland Kitchen oxyCODONE (OXY IR/ROXICODONE) 5 MG immediate release tablet Take 1 tablet (5 mg total) every 4 (four) hours as needed by mouth for severe pain (may take 1-2 tablets every 4 hours prn pain). 60 tablet 0  . oxymetazoline (AFRIN) 0.05 % nasal spray Place 1 spray into both nostrils 2 (two) times daily as needed for congestion.    Marland Kitchen  polyethylene glycol (MIRALAX / GLYCOLAX) packet Take 17 g daily by mouth.    Marland Kitchen acetaminophen (TYLENOL) 325 MG tablet Take 650 mg by mouth every 6 (six) hours as needed for moderate pain or headache.     . capecitabine (XELODA) 500 MG tablet Take 4 tablets (2013m) by mouth in AM & 3 tabs (15087m in PM, within 30 min of meals, on days of radiation only, M-F (Patient not taking: Reported on 10/04/2017) 196 tablet 0  . HYDROcodone-acetaminophen (NORCO/VICODIN) 5-325 MG tablet Take 1 tablet by mouth every 6 (six) hours as needed for moderate pain. (Patient not taking: Reported on 09/30/2017) 40 tablet 0  . hydrocortisone (ANUSOL-HC) 25 MG suppository Place 1 suppository (25 mg  total) rectally 2 (two) times daily. (Patient not taking: Reported on 09/30/2017) 12 suppository 0   No current facility-administered medications for this encounter.      REVIEW OF SYSTEMS: On review of systems, the patient reports that he is doing well overall. He denies any chest pain, shortness of breath, cough, fevers, chills, night sweats, unintended weight changes. He denies any bowel disturbances, and denies abdominal pain, nausea or vomiting. He reports constipation and is using Miralax once daily. He reports rectal pain 5/10 with bowel movement. He also reports discharge and occasional blood in stool and wiping. He denies rectal bleeding without bowel movement. He denies any new musculoskeletal or joint aches or pains. A complete review of systems is obtained and is otherwise negative.    PHYSICAL EXAM:  Wt Readings from Last 3 Encounters:  10/04/17 179 lb 3.2 oz (81.3 kg)  09/30/17 183 lb 6.4 oz (83.2 kg)  09/08/17 185 lb (83.9 kg)   Temp Readings from Last 3 Encounters:  10/04/17 98.4 F (36.9 C) (Oral)  09/30/17 98.4 F (36.9 C) (Oral)  09/08/17 99.5 F (37.5 C) (Oral)   BP Readings from Last 3 Encounters:  10/04/17 (!) 124/91  09/30/17 130/89  09/08/17 126/85   Pulse Readings from Last 3 Encounters:  10/04/17 73  09/30/17 70  09/08/17 73   Pain Assessment Pain Score: 4  Pain Loc: Rectum/10  In general this is a well appearing [place] in no acute distress. He is unaccompanied today. He is alert and oriented x4 and appropriate throughout the examination. HEENT reveals that the patient is normocephalic, atraumatic. EOMs are intact. PERRLA. Skin is intact without any evidence of gross lesions. Cardiovascular exam reveals a regular rate and rhythm, no clicks rubs or murmurs are auscultated. Chest is clear to auscultation bilaterally. Lymphatic assessment is performed and does not reveal any adenopathy in the cervical, supraclavicular, axillary, or inguinal  chains. Abdomen has active bowel sounds in all quadrants and is intact. The abdomen is soft, non tender, non distended. Lower extremities are negative for pretibial pitting edema, deep calf tenderness, cyanosis or clubbing. Rectal exam is deferred.   ECOG = 1  0 - Asymptomatic (Fully active, able to carry on all predisease activities without restriction)  1 - Symptomatic but completely ambulatory (Restricted in physically strenuous activity but ambulatory and able to carry out work of a light or sedentary nature. For example, light housework, office work)  2 - Symptomatic, <50% in bed during the day (Ambulatory and capable of all self care but unable to carry out any work activities. Up and about more than 50% of waking hours)  3 - Symptomatic, >50% in bed, but not bedbound (Capable of only limited self-care, confined to bed or chair 50% or more of  waking hours)  4 - Bedbound (Completely disabled. Cannot carry on any self-care. Totally confined to bed or chair)  5 - Death   Craig [name] MM, Creech RH, Tormey DC, et al. [phone]). "Toxicity and response criteria of the [facility]". [place] Oncol. 5 (6): 649-55    LABORATORY DATA:  Lab Results  Component Value Date   WBC 6.3 09/08/2017   HGB 13.8 09/08/2017   HCT 41.3 09/08/2017   MCV 88.8 09/08/2017   PLT 256 09/08/2017   Lab Results  Component Value Date   NA 140 09/08/2017   K 3.6 09/08/2017   CL 107 09/08/2017   CO2 23 09/08/2017   Lab Results  Component Value Date   ALT 40 09/08/2017   AST 28 09/08/2017   ALKPHOS 105 09/08/2017   BILITOT 0.6 09/08/2017      RADIOGRAPHY: Ct Chest W Contrast  Addendum Date: 09/16/2017   ADDENDUM REPORT: 09/16/2017 09:37 CONTRAST:  100 cc Isovue 300 Electronically Signed   By: Abigail Miyamoto M.D.   On: 09/16/2017 09:37   Result Date: 09/16/2017 CLINICAL DATA:  Rectal bleeding for 8 months with rectal pain and pressure. Constipation. Rectal cancer. Staging. EXAM: CT  CHEST, ABDOMEN, AND PELVIS WITH CONTRAST TECHNIQUE: Multidetector CT imaging of the chest, abdomen and pelvis was performed following the standard protocol during bolus administration of intravenous contrast. COMPARISON:  Plain films 07/23/2012 chest. abdominopelvic CT of 02/08/2009. Colonoscopy report of 09/08/2017, demonstrating a rectal mass 5 cm from anal verge. FINDINGS: CT CHEST FINDINGS Cardiovascular: Mildly tortuous thoracic aorta. Normal heart size, without pericardial effusion. No central pulmonary embolism, on this non-dedicated study. Mediastinum/Nodes: No supraclavicular adenopathy. No mediastinal or hilar adenopathy. Lungs/Pleura: No pleural fluid. 3 mm right upper lobe pulmonary nodule on image 47/ series 4. Musculoskeletal: No acute osseous abnormality. CT ABDOMEN PELVIS FINDINGS Hepatobiliary: Normal liver. Normal gallbladder, without biliary ductal dilatation. Pancreas: Normal, without mass or ductal dilatation. Spleen: Normal in size, without focal abnormality. Adrenals/Urinary Tract: Normal adrenal glands. 4 mm lower pole right renal collecting system calculus. Multiple tiny left renal collecting system stones. No hydronephrosis. Normal urinary bladder. Stomach/Bowel: Normal stomach, without wall thickening. The rectal primary is identified on image 116/series 2. Measures on the order of 5.6 cm on sagittal image 108. No obstruction. Normal terminal ileum and appendix. Normal small bowel. Vascular/Lymphatic: Aortic and branch vessel atherosclerosis. No retroperitoneal or retrocrural adenopathy. No pelvic sidewall adenopathy. Perirectal nodes, including an index node at the 3 o'clock position which measures 9 mm on image 114/series 2. A node extending into the sigmoid mesocolon measures 8 mm on image 110/series 2. Reproductive: Mild prostatomegaly. Other: No significant free fluid. No evidence of omental or peritoneal disease. Musculoskeletal: No acute osseous abnormality. Mild disc bulge at the  lumbosacral junction IMPRESSION: 1. Bulky rectal primary with perirectal nodal metastasis. 2. Nonspecific right upper lobe pulmonary nodule. Otherwise, no evidence of distant metastasis. 3.  Aortic Atherosclerosis (ICD10-I70.0). 4. Bilateral nephrolithiasis. Electronically Signed: By: Abigail Miyamoto M.D. On: 09/16/2017 09:21   Ct Abdomen Pelvis W Contrast  Addendum Date: 09/16/2017   ADDENDUM REPORT: 09/16/2017 09:37 CONTRAST:  100 cc Isovue 300 Electronically Signed   By: Abigail Miyamoto M.D.   On: 09/16/2017 09:37   Result Date: 09/16/2017 CLINICAL DATA:  Rectal bleeding for 8 months with rectal pain and pressure. Constipation. Rectal cancer. Staging. EXAM: CT CHEST, ABDOMEN, AND PELVIS WITH CONTRAST TECHNIQUE: Multidetector CT imaging of the chest, abdomen and pelvis was performed following the standard  protocol during bolus administration of intravenous contrast. COMPARISON:  Plain films 07/23/2012 chest. abdominopelvic CT of 02/08/2009. Colonoscopy report of 09/08/2017, demonstrating a rectal mass 5 cm from anal verge. FINDINGS: CT CHEST FINDINGS Cardiovascular: Mildly tortuous thoracic aorta. Normal heart size, without pericardial effusion. No central pulmonary embolism, on this non-dedicated study. Mediastinum/Nodes: No supraclavicular adenopathy. No mediastinal or hilar adenopathy. Lungs/Pleura: No pleural fluid. 3 mm right upper lobe pulmonary nodule on image 47/ series 4. Musculoskeletal: No acute osseous abnormality. CT ABDOMEN PELVIS FINDINGS Hepatobiliary: Normal liver. Normal gallbladder, without biliary ductal dilatation. Pancreas: Normal, without mass or ductal dilatation. Spleen: Normal in size, without focal abnormality. Adrenals/Urinary Tract: Normal adrenal glands. 4 mm lower pole right renal collecting system calculus. Multiple tiny left renal collecting system stones. No hydronephrosis. Normal urinary bladder. Stomach/Bowel: Normal stomach, without wall thickening. The rectal primary is  identified on image 116/series 2. Measures on the order of 5.6 cm on sagittal image 108. No obstruction. Normal terminal ileum and appendix. Normal small bowel. Vascular/Lymphatic: Aortic and branch vessel atherosclerosis. No retroperitoneal or retrocrural adenopathy. No pelvic sidewall adenopathy. Perirectal nodes, including an index node at the 3 o'clock position which measures 9 mm on image 114/series 2. A node extending into the sigmoid mesocolon measures 8 mm on image 110/series 2. Reproductive: Mild prostatomegaly. Other: No significant free fluid. No evidence of omental or peritoneal disease. Musculoskeletal: No acute osseous abnormality. Mild disc bulge at the lumbosacral junction IMPRESSION: 1. Bulky rectal primary with perirectal nodal metastasis. 2. Nonspecific right upper lobe pulmonary nodule. Otherwise, no evidence of distant metastasis. 3.  Aortic Atherosclerosis (ICD10-I70.0). 4. Bilateral nephrolithiasis. Electronically Signed: By: Abigail Miyamoto M.D. On: 09/16/2017 09:21       IMPRESSION/PLAN: 1. Incompletely staged invasive adenocarcinoma of the rectum. Dr. Lisbeth Renshaw discusses the pathology findings and reviewed the nature of rectal cancer. Dr. Lisbeth Renshaw discusses the utility of completing his staging work up with 10/07/17 pelvic MRI. He would recommend that if he has localized disease, we would proceed with concurrent chemoRT. He anticipates starting treatment in the next few weeks. We discussed the risks, benefits, short, and long term effects of radiotherapy, and the patient is interested in proceeding. Dr. Lisbeth Renshaw discusses the delivery and logistics of radiotherapy and anticipates a course of 5 1/2 to 6 weeks. He will proceed with CT simulation today. Written consent is obtained and placed in the chart, a copy was provided to the patient.  2. Irritation with bowel movements. We will continue to closely monitor his symptoms as we proceed with radiotherapy. Encouraged the patient to increase use of  Miralax to twice daily to decrease constipation and to consider milk of magnesia if constipation persists. He is aware that he should notify us immediately if he develops abdominal pain, fevers, or is unable to have a bowel movement given the concerns for endoscopic appearing obstruction. 3. Possible genetic predisposition to malignancy. Based on the patient's disease and strong family history of cancer, we discussed the benefits of genetic testing. The patient agrees to a referral to genetic counseling and this will be placed today.  The above documentation reflects my direct findings during this shared patient visit. Please see the separate note by Dr. Lisbeth Renshaw on this date for the remainder of the patient's plan of care.    Carola Rhine, PAC  This document serves as a record of services personally performed by Kyung Rudd, MD and Shona Simpson, PA-C. It was created on their behalf by Arlyce Harman, a trained medical scribe.  The creation of this record is based on the scribe's personal observations and the provider's statements to them. This document has been checked and approved by the attending provider.

## 2017-10-04 NOTE — Progress Notes (Signed)
Returned call to [employer] RN  informed patient to come  See her after CT simulation today  For his missed Chemo education at noon, and she will do it then ,patient agreed 2:43 PM

## 2017-10-04 NOTE — Telephone Encounter (Signed)
error 

## 2017-10-04 NOTE — Progress Notes (Signed)
GI Location of Tumor / Histology: Rectal Cancer  Craig [name] presented  months ago with symptoms of: Lower Gi bleed and pain past 8 months, also c/o in 01/07/17,failed to show to  have colonoscopy on 01/21/17,  Biopsies of (if applicable) revealed: Diagnosis 10/25/218: 1st colonoscopy  Rectum, biopsy - INVASIVE COLORECTAL ADENOCARCINOMA.  Past/Anticipated interventions by surgeon, if any: Dr. Nadeen Landau 09/08/17: colonoscopy with biopsy   Past/Anticipated interventions by medical oncology, if any:   Weight changes, if any: none Bowel/Bladder complaints, if any: constipation,pain when having bm, on Miralax  Nausea / Vomiting, if any: no  Pain issues, if any:  5/10 rectal with bowel movements, has discharge and occasional blood in stool and wiping, bladder no issues  Any blood per rectum:  No blood with bowel movemrnts  SAFETY ISSUES:  Prior radiation? NO  Pacemaker/ICD? NO  Is the patient on methotrexate?   Current Complaints/Details: Anxiety, hx kidney stones, former smoker, occasional alcohol use, no drug use Mother colon cancer,  Allergies:NKA BP (!) 124/91   Pulse 73   Temp 98.4 F (36.9 C) (Oral)   Resp 20   Ht 5\' 9"  (1.753 m)   Wt 179 lb 3.2 oz (81.3 kg)   SpO2 100%   BMI 26.46 kg/m   Wt Readings from Last 3 Encounters:  10/04/17 179 lb 3.2 oz (81.3 kg)  09/30/17 183 lb 6.4 oz (83.2 kg)  09/08/17 185 lb (83.9 kg)

## 2017-10-05 ENCOUNTER — Other Ambulatory Visit: Payer: Self-pay | Admitting: Radiation Oncology

## 2017-10-05 DIAGNOSIS — C20 Malignant neoplasm of rectum: Secondary | ICD-10-CM

## 2017-10-07 ENCOUNTER — Ambulatory Visit (HOSPITAL_COMMUNITY)
Admission: RE | Admit: 2017-10-07 | Discharge: 2017-10-07 | Disposition: A | Discharge status: Home / Self Care | Payer: Medicaid Other | Source: Ambulatory Visit | Attending: Surgery | Admitting: Surgery

## 2017-10-07 DIAGNOSIS — D490 Neoplasm of unspecified behavior of digestive system: Secondary | ICD-10-CM | POA: Diagnosis not present

## 2017-10-07 DIAGNOSIS — C20 Malignant neoplasm of rectum: Secondary | ICD-10-CM | POA: Diagnosis present

## 2017-10-07 MED ORDER — GADOBENATE DIMEGLUMINE 529 MG/ML IV SOLN
20.0000 mL | Freq: Once | INTRAVENOUS | Status: AC | PRN
  Administered 2017-10-07: 17 mL via INTRAVENOUS

## 2017-10-10 ENCOUNTER — Telehealth: Payer: Self-pay | Admitting: *Deleted

## 2017-10-10 DIAGNOSIS — C20 Malignant neoplasm of rectum: Secondary | ICD-10-CM

## 2017-10-10 MED ORDER — OXYCODONE HCL 5 MG PO TABS: 5.0000 mg | ORAL_TABLET | ORAL | 0 refills | Status: DC | PRN

## 2017-10-10 MED ORDER — GADOBENATE DIMEGLUMINE 529 MG/ML IV SOLN
20.0000 mL | Freq: Once | INTRAVENOUS | Status: AC | PRN
  Administered 2017-10-10: 17 mL via INTRAVENOUS

## 2017-10-10 MED FILL — XELODA 500 MG TABLET: 500 | 40 days supply | Qty: 196 | Fill #0

## 2017-10-10 MED FILL — oxyCODONE HCL 5 MG TABS: 5 | 5 days supply | Qty: 60 | Fill #0

## 2017-10-10 NOTE — Telephone Encounter (Signed)
Message from pt requesting Oxycodone refill. Left voicemail informing him script is ready for pick up.

## 2017-10-12 ENCOUNTER — Ambulatory Visit
Admission: RE | Admit: 2017-10-12 | Discharge: 2017-10-12 | Disposition: A | Discharge status: Home / Self Care | Payer: Medicaid Other | Source: Ambulatory Visit | Attending: Radiation Oncology | Admitting: Radiation Oncology

## 2017-10-12 DIAGNOSIS — Z510 Encounter for antineoplastic radiation therapy: Secondary | ICD-10-CM | POA: Diagnosis not present

## 2017-10-13 ENCOUNTER — Ambulatory Visit
Admission: RE | Admit: 2017-10-13 | Discharge: 2017-10-13 | Disposition: A | Discharge status: Home / Self Care | Payer: Medicaid Other | Source: Ambulatory Visit | Attending: Radiation Oncology | Admitting: Radiation Oncology

## 2017-10-13 ENCOUNTER — Encounter: Payer: Self-pay | Admitting: General Practice

## 2017-10-13 DIAGNOSIS — Z510 Encounter for antineoplastic radiation therapy: Secondary | ICD-10-CM | POA: Diagnosis not present

## 2017-10-13 NOTE — Progress Notes (Signed)
Pt education done, radiation therapy and you book, my business card, but imodium ui over the counter if having diarrhea, buy baby wipe, luke warm shower or bath,.,  Pat dry, nausea,vomiting, may need to eat softer foods, and 5-6 smallwer meals, no fresh fruit or fresh vegs if diarrhea, then eat low fiber diet, increase protein in diet, drink plenty water, bladder spasms may occur, freq,urgency voiding as well as dysuria, see MD weekly and prn,teavh  Back given

## 2017-10-13 NOTE — Progress Notes (Signed)
[facility] Psychosocial Distress Screening Clinical Social Work  Clinical Social Work was referred by distress screening protocol.  The patient scored a 5 on the Psychosocial Distress Thermometer which indicates moderate distress. Clinical Social Worker Edwyna Shell to assess for distress and other psychosocial needs. Unable to reach patient, left VM requesting call back, left brief explanation of Support Center services.  ONCBCN DISTRESS SCREENING 10/04/2017  Screening Type Initial Screening  Distress experienced in past week (1-10) 5  Practical problem type Housing;Insurance  Spiritual/Religous concerns type Relating to God  Physical Problem type Pain  Physician notified of physical symptoms Yes  Referral to clinical social work Yes  Referral to financial advocate Yes    10/14/17:  Patient returned call, reported main concern is pain and control of side effects.  Reports he has spoken w MD and treatment team about these issues but "it takes hours for it for it to calm down."  Drives himself to appointments.  Has been out of work since last week of October, rent has not been paid for November, worries that he may lose his housing.  Has applied for Medicaid and Social Security "they are working on it", has Physicist, medical.  Next appointment w Social Security is 12/13 - has submitted paperwork to Dr Benay Spice for his signature.  Prior to illness, worked as a Nurse, adultI've never been sick before in my life."  Has been to court w landlord and legal process leading up to eviction has begun, has next court date mid December.  Concerned that he may not be able to pay rent and may be evicted in the midst of his treatments.  CSW referred patient to [employer] and [employer].  Coal rep - needs to know whether patient has likely source of income in next two months so assistance would be time limited.  If so, agency may be able to assist by  soliciting funds from outside donors.  CSW also left VM for [employer], awaiting call back.   Clinical Social Worker follow up needed: Yes.    If yes, follow up plan: follow up w [place], investigate other housing options as indicated.     Edwyna Shell, LCSW Clinical Social Worker Phone:  [phone]

## 2017-10-14 ENCOUNTER — Ambulatory Visit
Admission: RE | Admit: 2017-10-14 | Discharge: 2017-10-14 | Disposition: A | Discharge status: Home / Self Care | Payer: Medicaid Other | Source: Ambulatory Visit | Attending: Radiation Oncology | Admitting: Radiation Oncology

## 2017-10-14 DIAGNOSIS — Z510 Encounter for antineoplastic radiation therapy: Secondary | ICD-10-CM | POA: Diagnosis not present

## 2017-10-17 ENCOUNTER — Encounter: Payer: Self-pay | Admitting: General Practice

## 2017-10-17 ENCOUNTER — Ambulatory Visit
Admission: RE | Admit: 2017-10-17 | Discharge: 2017-10-17 | Disposition: A | Discharge status: Home / Self Care | Payer: Medicaid Other | Source: Ambulatory Visit | Attending: Radiation Oncology | Admitting: Radiation Oncology

## 2017-10-17 DIAGNOSIS — Z510 Encounter for antineoplastic radiation therapy: Secondary | ICD-10-CM | POA: Diagnosis not present

## 2017-10-17 NOTE — Progress Notes (Signed)
[facility] CSW Progress Note  CSW met w patient at [facility] office, reviewed patient concerns including loss of income, potential loss of housing due to nonpayment of rent, cost of transportation.  Engaged patient in problem solving discussion of available resources, both within his support network and from community resources.  Patient identified possible help from within his social support network for temporary housing if he is evicted.  Is concerned that he faces eviction from his current apartment due to nonpayment of rent.  Patient has been referred to community agency for partial assistance, contingent on patient being able to generate support from other sources to bring rent up to date.  Referral made to [facility] for help w disability application process.  Patient given resource list of subsidized housing, shelters and transitional housing options - states that he will work w family members to identify alternatives before accessing community resources.  Patient given referral for [facility] transportation assistance, encouraged to continue to work w [employer], and continue to reach out to his social network for practical support as needed.  Patient validated for work he has done to date to pursue various options.    Edwyna Shell, LCSW Clinical Social Worker Phone:  [phone]

## 2017-10-18 ENCOUNTER — Other Ambulatory Visit: Payer: Self-pay | Admitting: *Deleted

## 2017-10-18 ENCOUNTER — Encounter: Payer: Self-pay | Admitting: General Practice

## 2017-10-18 ENCOUNTER — Ambulatory Visit
Admission: RE | Admit: 2017-10-18 | Discharge: 2017-10-18 | Disposition: A | Discharge status: Home / Self Care | Payer: Medicaid Other | Source: Ambulatory Visit | Attending: Radiation Oncology | Admitting: Radiation Oncology

## 2017-10-18 DIAGNOSIS — C20 Malignant neoplasm of rectum: Secondary | ICD-10-CM

## 2017-10-18 DIAGNOSIS — Z510 Encounter for antineoplastic radiation therapy: Secondary | ICD-10-CM | POA: Diagnosis not present

## 2017-10-18 MED ORDER — OXYCODONE HCL 5 MG PO TABS: 5.0000 mg | ORAL_TABLET | ORAL | 0 refills | Status: DC | PRN

## 2017-10-18 MED FILL — oxyCODONE HCL 5 MG TABS: 5 | 5 days supply | Qty: 60 | Fill #0

## 2017-10-18 NOTE — Progress Notes (Signed)
Cra[facility] [name] Comprehensive Psychosocial Assessment Clinical Social Work  Clinical Social Work was referred by patient.  Clinical Social Worker Energy manager Iley Breeden(met with patient in office to assess psychosocial, emotional, mental health, and spiritual needs of the patient.  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    Characteristics of the patient's support system: limited, adult children live out of town and "they have their own issues and can't help me"; has uncle who has offered support including offer to live in family home in [place].  Appears to have some support from relative in [place] who has offered some help.    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  Unsure whether he qualifies at this point.  No short term disability coverage.  Also referred to [facility] for help w partial payment of rent.  Patient is in process of being evicted from apartment as he has not paid rent since 11/1.  Patient also working Scientific laboratory technician at [employer] and has applied for [employer], has been given several Market researcher follow up needed: Yes.    If yes, follow up plan: CSW will continue to follow, attempt to assist patient w community resources that may be able to help w partial payment of rent via donations from [facility]; engage patient in problem solving re financial stress and available resources, encourage to make realistic plans for being out of work/lacking income; encouragement to access resources from within his social support network; monitor referral to [facility] and assist as needed; provided patient w information about [facility] and [facility] to assist w transportation, patient stated he would "think about it."

## 2017-10-18 NOTE — Telephone Encounter (Signed)
Pt came in to office to request Oxycodone refill. Pt reports persistent rectal pain. He denies constipation but has pain with each BM. Instructed him to add stool softener. He agreed to do so. Narcotic refilled, per Dr. Benay Spice.

## 2017-10-19 ENCOUNTER — Ambulatory Visit
Admission: RE | Admit: 2017-10-19 | Discharge: 2017-10-19 | Disposition: A | Discharge status: Home / Self Care | Payer: Medicaid Other | Source: Ambulatory Visit | Attending: Radiation Oncology | Admitting: Radiation Oncology

## 2017-10-19 DIAGNOSIS — Z510 Encounter for antineoplastic radiation therapy: Secondary | ICD-10-CM | POA: Diagnosis not present

## 2017-10-20 ENCOUNTER — Ambulatory Visit
Admission: RE | Admit: 2017-10-20 | Discharge: 2017-10-20 | Disposition: A | Discharge status: Home / Self Care | Payer: Medicaid Other | Source: Ambulatory Visit | Attending: Radiation Oncology | Admitting: Radiation Oncology

## 2017-10-20 ENCOUNTER — Encounter: Payer: Self-pay | Admitting: Nurse Practitioner

## 2017-10-20 ENCOUNTER — Telehealth: Payer: Self-pay | Admitting: Nurse Practitioner

## 2017-10-20 ENCOUNTER — Other Ambulatory Visit (HOSPITAL_BASED_OUTPATIENT_CLINIC_OR_DEPARTMENT_OTHER): Payer: Self-pay

## 2017-10-20 ENCOUNTER — Ambulatory Visit (HOSPITAL_BASED_OUTPATIENT_CLINIC_OR_DEPARTMENT_OTHER): Payer: Self-pay | Admitting: Nurse Practitioner

## 2017-10-20 VITALS — BP 130/87 | HR 75 | Temp 98.5°F | Resp 18 | Ht 69.0 in | Wt 177.5 lb

## 2017-10-20 DIAGNOSIS — C20 Malignant neoplasm of rectum: Secondary | ICD-10-CM

## 2017-10-20 DIAGNOSIS — G893 Neoplasm related pain (acute) (chronic): Secondary | ICD-10-CM

## 2017-10-20 DIAGNOSIS — Z510 Encounter for antineoplastic radiation therapy: Secondary | ICD-10-CM | POA: Diagnosis not present

## 2017-10-20 LAB — CBC WITH DIFFERENTIAL/PLATELET
BASO%: 0.4 % (ref 0.0–2.0)
Basophils Absolute: 0 10e3/uL (ref 0.0–0.1)
EOS%: 6.1 % (ref 0.0–7.0)
Eosinophils Absolute: 0.2 10e3/uL (ref 0.0–0.5)
HCT: 41.6 % (ref 38.4–49.9)
HGB: 13.7 g/dL (ref 13.0–17.1)
LYMPH%: 16.8 % (ref 14.0–49.0)
MCH: 29.1 pg (ref 27.2–33.4)
MCHC: 32.9 g/dL (ref 32.0–36.0)
MCV: 88.3 fL (ref 79.3–98.0)
MONO#: 0.3 10e3/uL (ref 0.1–0.9)
MONO%: 10.4 % (ref 0.0–14.0)
NEUT#: 1.9 10e3/uL (ref 1.5–6.5)
NEUT%: 66.3 % (ref 39.0–75.0)
Platelets: 322 10e3/uL (ref 140–400)
RBC: 4.71 10e6/uL (ref 4.20–5.82)
RDW: 12.6 % (ref 11.0–14.6)
WBC: 2.8 10e3/uL — ABNORMAL LOW (ref 4.0–10.3)
lymph#: 0.5 10e3/uL — ABNORMAL LOW (ref 0.9–3.3)

## 2017-10-20 LAB — COMPREHENSIVE METABOLIC PANEL
ALBUMIN: 3.6 g/dL (ref 3.5–5.0)
ALK PHOS: 116 U/L (ref 40–150)
ALT: 49 U/L (ref 0–55)
AST: 26 U/L (ref 5–34)
Anion Gap: 10 mEq/L (ref 3–11)
BILIRUBIN TOTAL: 0.41 mg/dL (ref 0.20–1.20)
BUN: 15.5 mg/dL (ref 7.0–26.0)
CO2: 23 mEq/L (ref 22–29)
CREATININE: 1.1 mg/dL (ref 0.7–1.3)
Calcium: 9.8 mg/dL (ref 8.4–10.4)
Chloride: 106 mEq/L (ref 98–109)
GLUCOSE: 101 mg/dL (ref 70–140)
Potassium: 4.2 mEq/L (ref 3.5–5.1)
SODIUM: 138 meq/L (ref 136–145)
TOTAL PROTEIN: 7.6 g/dL (ref 6.4–8.3)

## 2017-10-20 NOTE — Progress Notes (Signed)
  [facility] OFFICE PROGRESS NOTE   Diagnosis: Rectal cancer  INTERVAL HISTORY:   Craig [name] returns as scheduled.  He began radiation and Xeloda 10/12/2017.  He denies nausea/vomiting.  No mouth sores.  No diarrhea.  No hand or foot pain or redness.  He continues to note bleeding with bowel movements.  He has rectal pain and takes oxycodone as needed with partial relief.  Objective:  Vital signs in last 24 hours:  Blood pressure 130/87, pulse 75, temperature 98.5 F (36.9 C), temperature source Oral, resp. rate 18, height 5\' 9"  (1.753 m), weight 177 lb 8 oz (80.5 kg), SpO2 99 %.    HEENT: No thrush or ulcers. Resp: Lungs clear bilaterally. Cardio: Regular rate and rhythm. GI: Abdomen soft and nontender.  No hepatomegaly. Vascular: No leg edema.  Skin: Palms without erythema.   Lab Results:  Lab Results  Component Value Date   WBC 2.8 (L) 10/20/2017   HGB 13.7 10/20/2017   HCT 41.6 10/20/2017   MCV 88.3 10/20/2017   PLT 322 10/20/2017   NEUTROABS 1.9 10/20/2017    Imaging:  No results found.  Medications: I have reviewed the patient's current medications.  Assessment/Plan: 1. Rectal cancer ? Colonoscopy 09/08/2017, rectal mass at 5 cm from the anal verge, biopsy confirmed invasive adenocarcinoma ? Staging CTs 09/15/2017, rectal mass and perirectal lymphadenopathy, indeterminate right upper lobe nodule ? Elevated CEA ? MRI pelvis 10/07/2017-5.3 cm circumferential lower rectal mass with perirectal extension along the inferior mesenteric vein;  T3, N1;  distance from tumor to the anal sphincter is 5 cm. ? Initiation of radiation and concurrent Xeloda chemotherapy 10/12/2017  2.  Pain and bleeding secondary to #1   Disposition: [name] appears stable.  He continues radiation and Xeloda.  We will see him in follow-up in 2 weeks.  He will contact the office in the interim with any problems.    Ned Card ANP/GNP-BC   10/20/2017  11:09  AM

## 2017-10-20 NOTE — Telephone Encounter (Signed)
Scheduled appt per 12/6 los - Gave patient AVS and calender per los - per [place] okay to schedule 2:45

## 2017-10-21 ENCOUNTER — Ambulatory Visit
Admission: RE | Admit: 2017-10-21 | Discharge: 2017-10-21 | Disposition: A | Discharge status: Home / Self Care | Payer: Medicaid Other | Source: Ambulatory Visit | Attending: Radiation Oncology | Admitting: Radiation Oncology

## 2017-10-21 DIAGNOSIS — Z510 Encounter for antineoplastic radiation therapy: Secondary | ICD-10-CM | POA: Diagnosis not present

## 2017-10-21 MED ORDER — PROCHLORPERAZINE MALEATE 10 MG PO TABS: 10.0000 mg | ORAL_TABLET | Freq: Four times a day (QID) | ORAL | 1 refills | Status: DC | PRN

## 2017-10-21 MED FILL — PROCHLORPERAZINE 10 MG TAB: 10 | 7 days supply | Qty: 30 | Fill #0

## 2017-10-24 ENCOUNTER — Ambulatory Visit: Payer: Medicaid Other

## 2017-10-25 ENCOUNTER — Ambulatory Visit
Admission: RE | Admit: 2017-10-25 | Discharge: 2017-10-25 | Disposition: A | Discharge status: Home / Self Care | Payer: Medicaid Other | Source: Ambulatory Visit | Attending: Radiation Oncology | Admitting: Radiation Oncology

## 2017-10-25 DIAGNOSIS — Z510 Encounter for antineoplastic radiation therapy: Secondary | ICD-10-CM | POA: Diagnosis not present

## 2017-10-25 NOTE — Progress Notes (Signed)
  Radiation Oncology         [phone] ________________________________  Name: Craig [name] MRN: [id number]  Date: 10/04/2017  DOB: 06-May-1961  Optical Surface Tracking Plan:  Since intensity modulated radiotherapy (IMRT) and 3D conformal radiation treatment methods are predicated on accurate and precise positioning for treatment, intrafraction motion monitoring is medically necessary to ensure accurate and safe treatment delivery.  The ability to quantify intrafraction motion without excessive ionizing radiation dose can only be performed with optical surface tracking. Accordingly, surface imaging offers the opportunity to obtain 3D measurements of patient position throughout IMRT and 3D treatments without excessive radiation exposure.  I am ordering optical surface tracking for this patient's upcoming course of radiotherapy. ________________________________  Kyung Rudd, MD 10/25/2017 9:04 AM    Reference:   Ursula Alert, J, et al. Surface imaging-based analysis of intrafraction motion for breast radiotherapy patients.Journal of [place], n. 6, nov. 2014. ISSN [id number].   Available at: <http://www.jacmp.org/index.php/jacmp/article/view/4957>.

## 2017-10-25 NOTE — Progress Notes (Signed)
  Radiation Oncology         [phone] ________________________________  Name: Craig [name] MRN: [id number]  Date: 10/04/2017  DOB: 1961/09/29   SIMULATION AND TREATMENT PLANNING NOTE  DIAGNOSIS:     ICD-10-CM   1. Rectal cancer ([place]) C20      The patient presented for simulation for the patient's upcoming course of radiation for the diagnosis of rectal cancer. The patient was placed in a supine position. A customized vac-lock bag was constructed to aid in patient immobilization on. This complex treatment device will be used on a daily basis during the treatment. In this fashion a CT scan was obtained through the pelvic region and the isocenter was placed near midline within the pelvis. Surface markings were placed.  The patient's imaging was loaded into the radiation treatment planning system. The patient will initially be planned to receive a course of radiation to a dose of 45 Gy. This will be accomplished in 25 fractions at 1.8 gray per fraction. This initial treatment will correspond to a 3-D conformal technique. The target has been contoured in addition to the rectum, bladder and femoral heads. Dose volume histograms of each of these structures have been requested and these will be carefully reviewed as part of the 3-D conformal treatment planning process. To accomplish this initial treatment, 4 customized blocks have been designed for this purpose. Each of these 4 complex treatment devices will be used on a daily basis during the initial course of the treatment. It is anticipated that the patient will then receive a boost for an additional 5.4 Gy. The anticipated total dose therefore will be 50.4 Gy.    Special treatment procedure The patient will receive chemotherapy during the course of radiation treatment. The patient may experience increased or overlapping toxicity due to this combined-modality approach and the patient will be monitored for such problems. This may include extra  lab work as necessary. This therefore constitutes a special treatment procedure.    ________________________________  Jodelle Gross, MD, PhD

## 2017-10-26 ENCOUNTER — Ambulatory Visit
Admission: RE | Admit: 2017-10-26 | Discharge: 2017-10-26 | Disposition: A | Discharge status: Home / Self Care | Payer: Medicaid Other | Source: Ambulatory Visit | Attending: Radiation Oncology | Admitting: Radiation Oncology

## 2017-10-26 DIAGNOSIS — Z510 Encounter for antineoplastic radiation therapy: Secondary | ICD-10-CM | POA: Diagnosis not present

## 2017-10-27 ENCOUNTER — Ambulatory Visit
Admission: RE | Admit: 2017-10-27 | Discharge: 2017-10-27 | Disposition: A | Discharge status: Home / Self Care | Payer: Medicaid Other | Source: Ambulatory Visit | Attending: Radiation Oncology | Admitting: Radiation Oncology

## 2017-10-27 DIAGNOSIS — Z510 Encounter for antineoplastic radiation therapy: Secondary | ICD-10-CM | POA: Diagnosis not present

## 2017-10-28 ENCOUNTER — Ambulatory Visit
Admission: RE | Admit: 2017-10-28 | Discharge: 2017-10-28 | Disposition: A | Discharge status: Home / Self Care | Payer: Medicaid Other | Source: Ambulatory Visit | Attending: Radiation Oncology | Admitting: Radiation Oncology

## 2017-10-28 ENCOUNTER — Other Ambulatory Visit: Payer: Self-pay | Admitting: Radiation Oncology

## 2017-10-28 DIAGNOSIS — C20 Malignant neoplasm of rectum: Secondary | ICD-10-CM

## 2017-10-28 DIAGNOSIS — Z510 Encounter for antineoplastic radiation therapy: Secondary | ICD-10-CM | POA: Diagnosis not present

## 2017-10-28 MED ORDER — OXYCODONE HCL 5 MG PO TABS: 5.0000 mg | ORAL_TABLET | ORAL | 0 refills | Status: DC | PRN

## 2017-10-28 MED FILL — oxyCODONE HCL 5 MG TABS: 5 | 5 days supply | Qty: 60 | Fill #0

## 2017-10-31 ENCOUNTER — Ambulatory Visit
Admission: RE | Admit: 2017-10-31 | Discharge: 2017-10-31 | Disposition: A | Discharge status: Home / Self Care | Payer: Medicaid Other | Source: Ambulatory Visit | Attending: Radiation Oncology | Admitting: Radiation Oncology

## 2017-10-31 DIAGNOSIS — Z510 Encounter for antineoplastic radiation therapy: Secondary | ICD-10-CM | POA: Diagnosis not present

## 2017-11-01 ENCOUNTER — Encounter: Payer: Self-pay | Admitting: General Practice

## 2017-11-01 ENCOUNTER — Ambulatory Visit
Admission: RE | Admit: 2017-11-01 | Discharge: 2017-11-01 | Disposition: A | Discharge status: Home / Self Care | Payer: Medicaid Other | Source: Ambulatory Visit | Attending: Radiation Oncology | Admitting: Radiation Oncology

## 2017-11-01 DIAGNOSIS — Z510 Encounter for antineoplastic radiation therapy: Secondary | ICD-10-CM | POA: Diagnosis not present

## 2017-11-01 NOTE — Progress Notes (Signed)
[place] CSW Progress Note  Patient wants help w OTC Miralax and Colace - CSW informed patient of [facility], needs prescription from MD to get these medications there.  Must have prescription from MD - CSW asked nurse navigator to assist. Patient will contact pharmacy to see if medications are available there.  Edwyna Shell, LCSW Clinical Social Worker Phone:  [phone]

## 2017-11-01 NOTE — Progress Notes (Signed)
[facility] CSW Progress Note  Call from [facility], patient has declined their help w filing for disabiilty. Per [facility], patient has decided to file for Social Security disability, Medicaid and Medicare on his own.  Cl[facility] can be recontacted if patient is declined on first application.  Edwyna Shell, LCSW Clinical Social Worker Phone:  [phone]

## 2017-11-02 ENCOUNTER — Ambulatory Visit
Admission: RE | Admit: 2017-11-02 | Discharge: 2017-11-02 | Disposition: A | Discharge status: Home / Self Care | Payer: Medicaid Other | Source: Ambulatory Visit | Attending: Radiation Oncology | Admitting: Radiation Oncology

## 2017-11-02 ENCOUNTER — Other Ambulatory Visit: Payer: Self-pay | Admitting: *Deleted

## 2017-11-02 ENCOUNTER — Telehealth: Payer: Self-pay | Admitting: *Deleted

## 2017-11-02 DIAGNOSIS — Z510 Encounter for antineoplastic radiation therapy: Secondary | ICD-10-CM | POA: Diagnosis not present

## 2017-11-02 MED ORDER — POLYETHYLENE GLYCOL 3350 17 G PO PACK: 17.0000 g | PACK | Freq: Every day | ORAL | 3 refills | Status: DC

## 2017-11-02 MED ORDER — DOCUSATE SODIUM 100 MG PO CAPS: 200.0000 mg | ORAL_CAPSULE | Freq: Every day | ORAL | 0 refills | Status: DC

## 2017-11-02 MED FILL — POLYETHYLENE GLYCOL 3350: 16 days supply | Qty: 255 | Fill #0

## 2017-11-02 NOTE — Telephone Encounter (Signed)
Called patient's sister to notuify her that his medications had been e-scribed to [facility].

## 2017-11-03 ENCOUNTER — Other Ambulatory Visit (HOSPITAL_BASED_OUTPATIENT_CLINIC_OR_DEPARTMENT_OTHER): Payer: Self-pay

## 2017-11-03 ENCOUNTER — Encounter: Payer: Self-pay | Admitting: Nurse Practitioner

## 2017-11-03 ENCOUNTER — Encounter: Payer: Self-pay | Admitting: *Deleted

## 2017-11-03 ENCOUNTER — Ambulatory Visit
Admission: RE | Admit: 2017-11-03 | Discharge: 2017-11-03 | Disposition: A | Discharge status: Home / Self Care | Payer: Medicaid Other | Source: Ambulatory Visit | Attending: Radiation Oncology | Admitting: Radiation Oncology

## 2017-11-03 ENCOUNTER — Ambulatory Visit (HOSPITAL_BASED_OUTPATIENT_CLINIC_OR_DEPARTMENT_OTHER): Payer: Self-pay | Admitting: Nurse Practitioner

## 2017-11-03 VITALS — BP 122/77 | HR 72 | Temp 98.5°F | Resp 18 | Ht 69.0 in | Wt 178.3 lb

## 2017-11-03 DIAGNOSIS — Z510 Encounter for antineoplastic radiation therapy: Secondary | ICD-10-CM | POA: Diagnosis not present

## 2017-11-03 DIAGNOSIS — C20 Malignant neoplasm of rectum: Secondary | ICD-10-CM

## 2017-11-03 LAB — CBC WITH DIFFERENTIAL/PLATELET
BASO%: 0.5 % (ref 0.0–2.0)
BASOS ABS: 0 10*3/uL (ref 0.0–0.1)
EOS ABS: 0.1 10*3/uL (ref 0.0–0.5)
EOS%: 5.8 % (ref 0.0–7.0)
HCT: 39.2 % (ref 38.4–49.9)
HEMOGLOBIN: 13 g/dL (ref 13.0–17.1)
LYMPH%: 10.5 % — AB (ref 14.0–49.0)
MCH: 29.4 pg (ref 27.2–33.4)
MCHC: 33.2 g/dL (ref 32.0–36.0)
MCV: 88.7 fL (ref 79.3–98.0)
MONO#: 0.3 10*3/uL (ref 0.1–0.9)
MONO%: 12.1 % (ref 0.0–14.0)
NEUT#: 1.8 10*3/uL (ref 1.5–6.5)
NEUT%: 71.1 % (ref 39.0–75.0)
Platelets: 214 10*3/uL (ref 140–400)
RBC: 4.42 10*6/uL (ref 4.20–5.82)
RDW: 13.3 % (ref 11.0–14.6)
WBC: 2.6 10*3/uL — AB (ref 4.0–10.3)
lymph#: 0.3 10*3/uL — ABNORMAL LOW (ref 0.9–3.3)

## 2017-11-03 NOTE — Progress Notes (Signed)
  [facility] OFFICE PROGRESS NOTE   Diagnosis: Rectal cancer  INTERVAL HISTORY:   Craig [name] returns as scheduled.  He continues radiation/Xeloda.  He denies nausea/vomiting.  No mouth sores.  No diarrhea.  No hand or foot pain or redness.  He notes that his hands are "dark".  Rectal bleeding and pain have improved considerably.  Objective:  Vital signs in last 24 hours:  Blood pressure 122/77, pulse 72, temperature 98.5 F (36.9 C), temperature source Oral, resp. rate 18, height 5\' 9"  (1.753 m), weight 178 lb 4.8 oz (80.9 kg), SpO2 100 %.    HEENT: No thrush or ulcers. Resp: Lungs clear bilaterally. Cardio: Regular rate and rhythm. GI: Abdomen soft and nontender.  No hepatomegaly. Vascular: No leg edema.  Skin: Palms hyperpigmented without erythema.     Lab Results:  Lab Results  Component Value Date   WBC 2.6 (L) 11/03/2017   HGB 13.0 11/03/2017   HCT 39.2 11/03/2017   MCV 88.7 11/03/2017   PLT 214 11/03/2017   NEUTROABS 1.8 11/03/2017    Imaging:  No results found.  Medications: I have reviewed the patient's current medications.  Assessment/Plan: 1. Rectal cancer ? Colonoscopy 09/08/2017, rectal mass at 5 cm from the anal verge, biopsy confirmed invasive adenocarcinoma ? Staging CTs 09/15/2017, rectal mass and perirectal lymphadenopathy, indeterminate right upper lobe nodule ? Elevated CEA ? MRI pelvis 10/07/2017-5.3 cm circumferential lower rectal mass with perirectal extension along the inferior mesenteric vein;  T3, N1;  distance from tumor to the anal sphincter is 5 cm. ? Initiation of radiation and concurrent Xeloda chemotherapy 10/12/2017  2.Pain and bleeding secondary to #1.  Improved.    Disposition: Craig [name] appears stable.  He continues radiation/Xeloda.  He will return for labs and a follow-up visit in approximately 2 weeks.  He will contact the office in the interim with any problems.    Ned Card ANP/GNP-BC   11/03/2017    3:45 PM

## 2017-11-04 ENCOUNTER — Ambulatory Visit
Admission: RE | Admit: 2017-11-04 | Discharge: 2017-11-04 | Disposition: A | Discharge status: Home / Self Care | Payer: Medicaid Other | Source: Ambulatory Visit | Attending: Radiation Oncology | Admitting: Radiation Oncology

## 2017-11-04 DIAGNOSIS — Z510 Encounter for antineoplastic radiation therapy: Secondary | ICD-10-CM | POA: Diagnosis not present

## 2017-11-07 ENCOUNTER — Telehealth: Payer: Self-pay | Admitting: Oncology

## 2017-11-07 ENCOUNTER — Ambulatory Visit
Admission: RE | Admit: 2017-11-07 | Discharge: 2017-11-07 | Disposition: A | Discharge status: Home / Self Care | Payer: Medicaid Other | Source: Ambulatory Visit | Attending: Radiation Oncology | Admitting: Radiation Oncology

## 2017-11-07 DIAGNOSIS — Z510 Encounter for antineoplastic radiation therapy: Secondary | ICD-10-CM | POA: Diagnosis not present

## 2017-11-07 NOTE — Telephone Encounter (Signed)
Spoke to patient regarding upcoming January appointments.

## 2017-11-09 ENCOUNTER — Ambulatory Visit
Admission: RE | Admit: 2017-11-09 | Discharge: 2017-11-09 | Disposition: A | Discharge status: Home / Self Care | Payer: Medicaid Other | Source: Ambulatory Visit | Attending: Radiation Oncology | Admitting: Radiation Oncology

## 2017-11-09 DIAGNOSIS — Z510 Encounter for antineoplastic radiation therapy: Secondary | ICD-10-CM | POA: Diagnosis not present

## 2017-11-10 ENCOUNTER — Ambulatory Visit
Admission: RE | Admit: 2017-11-10 | Discharge: 2017-11-10 | Disposition: A | Discharge status: Home / Self Care | Payer: Medicaid Other | Source: Ambulatory Visit | Attending: Radiation Oncology | Admitting: Radiation Oncology

## 2017-11-10 DIAGNOSIS — Z510 Encounter for antineoplastic radiation therapy: Secondary | ICD-10-CM | POA: Diagnosis not present

## 2017-11-11 ENCOUNTER — Ambulatory Visit
Admission: RE | Admit: 2017-11-11 | Discharge: 2017-11-11 | Disposition: A | Discharge status: Home / Self Care | Payer: Medicaid Other | Source: Ambulatory Visit | Attending: Radiation Oncology | Admitting: Radiation Oncology

## 2017-11-11 DIAGNOSIS — Z510 Encounter for antineoplastic radiation therapy: Secondary | ICD-10-CM | POA: Diagnosis not present

## 2017-11-14 ENCOUNTER — Ambulatory Visit
Admission: RE | Admit: 2017-11-14 | Discharge: 2017-11-14 | Disposition: A | Discharge status: Home / Self Care | Payer: Medicaid Other | Source: Ambulatory Visit | Attending: Radiation Oncology | Admitting: Radiation Oncology

## 2017-11-14 ENCOUNTER — Other Ambulatory Visit: Payer: Self-pay | Admitting: Nurse Practitioner

## 2017-11-14 DIAGNOSIS — C20 Malignant neoplasm of rectum: Secondary | ICD-10-CM

## 2017-11-14 DIAGNOSIS — Z510 Encounter for antineoplastic radiation therapy: Secondary | ICD-10-CM | POA: Diagnosis not present

## 2017-11-14 MED ORDER — OXYCODONE HCL 5 MG PO TABS: 5.0000 mg | ORAL_TABLET | ORAL | 0 refills | Status: DC | PRN

## 2017-11-14 MED FILL — oxyCODONE HCL 5 MG TABS: 5 | 5 days supply | Qty: 60 | Fill #0

## 2017-11-16 ENCOUNTER — Ambulatory Visit
Admission: RE | Admit: 2017-11-16 | Discharge: 2017-11-16 | Disposition: A | Discharge status: Home / Self Care | Payer: Medicaid Other | Source: Ambulatory Visit | Attending: Radiation Oncology | Admitting: Radiation Oncology

## 2017-11-16 DIAGNOSIS — Z510 Encounter for antineoplastic radiation therapy: Secondary | ICD-10-CM | POA: Diagnosis not present

## 2017-11-17 ENCOUNTER — Ambulatory Visit
Admission: RE | Admit: 2017-11-17 | Discharge: 2017-11-17 | Disposition: A | Discharge status: Home / Self Care | Payer: Medicaid Other | Source: Ambulatory Visit | Attending: Radiation Oncology | Admitting: Radiation Oncology

## 2017-11-17 DIAGNOSIS — Z510 Encounter for antineoplastic radiation therapy: Secondary | ICD-10-CM | POA: Diagnosis not present

## 2017-11-18 ENCOUNTER — Ambulatory Visit: Payer: Medicaid Other

## 2017-11-18 ENCOUNTER — Ambulatory Visit
Admission: RE | Admit: 2017-11-18 | Discharge: 2017-11-18 | Disposition: A | Discharge status: Home / Self Care | Payer: Medicaid Other | Source: Ambulatory Visit | Attending: Radiation Oncology | Admitting: Radiation Oncology

## 2017-11-18 DIAGNOSIS — Z510 Encounter for antineoplastic radiation therapy: Secondary | ICD-10-CM | POA: Diagnosis not present

## 2017-11-21 ENCOUNTER — Ambulatory Visit: Payer: Medicaid Other

## 2017-11-21 ENCOUNTER — Inpatient Hospital Stay: Payer: Medicaid Other | Attending: Oncology

## 2017-11-21 ENCOUNTER — Ambulatory Visit
Admission: RE | Admit: 2017-11-21 | Discharge: 2017-11-21 | Disposition: A | Discharge status: Home / Self Care | Payer: Medicaid Other | Source: Ambulatory Visit | Attending: Radiation Oncology | Admitting: Radiation Oncology

## 2017-11-21 ENCOUNTER — Inpatient Hospital Stay (HOSPITAL_BASED_OUTPATIENT_CLINIC_OR_DEPARTMENT_OTHER): Payer: Medicaid Other | Admitting: Oncology

## 2017-11-21 VITALS — BP 107/79 | HR 67 | Temp 97.8°F | Resp 17 | Ht 69.0 in | Wt 177.4 lb

## 2017-11-21 DIAGNOSIS — Z7183 Encounter for nonprocreative genetic counseling: Secondary | ICD-10-CM | POA: Diagnosis not present

## 2017-11-21 DIAGNOSIS — C20 Malignant neoplasm of rectum: Secondary | ICD-10-CM

## 2017-11-21 DIAGNOSIS — R970 Elevated carcinoembryonic antigen [CEA]: Secondary | ICD-10-CM | POA: Diagnosis not present

## 2017-11-21 DIAGNOSIS — Z79899 Other long term (current) drug therapy: Secondary | ICD-10-CM | POA: Diagnosis not present

## 2017-11-21 DIAGNOSIS — Z510 Encounter for antineoplastic radiation therapy: Secondary | ICD-10-CM | POA: Diagnosis not present

## 2017-11-21 LAB — CBC WITH DIFFERENTIAL/PLATELET
Abs Granulocyte: 2.7 10*3/uL (ref 1.5–6.5)
Basophils Absolute: 0 10*3/uL (ref 0.0–0.1)
Basophils Relative: 0 %
Eosinophils Absolute: 0.2 10*3/uL (ref 0.0–0.5)
Eosinophils Relative: 5 %
HCT: 41 % (ref 38.4–49.9)
Hemoglobin: 14.2 g/dL (ref 13.0–17.1)
Lymphocytes Relative: 9 %
Lymphs Abs: 0.3 10*3/uL — ABNORMAL LOW (ref 0.9–3.3)
MCH: 30.7 pg (ref 27.2–33.4)
MCHC: 34.6 g/dL (ref 32.0–36.0)
MCV: 88.7 fL (ref 79.3–98.0)
Monocytes Absolute: 0.4 10*3/uL (ref 0.1–0.9)
Monocytes Relative: 11 %
Neutro Abs: 2.7 10*3/uL (ref 1.5–6.5)
Neutrophils Relative %: 75 %
Platelets: 206 10*3/uL (ref 140–400)
RBC: 4.62 MIL/uL (ref 4.20–5.82)
RDW: 16.1 % — ABNORMAL HIGH (ref 11.0–15.6)
WBC: 3.6 10*3/uL — ABNORMAL LOW (ref 4.0–10.3)

## 2017-11-21 NOTE — Progress Notes (Signed)
  [facility] OFFICE PROGRESS NOTE   Diagnosis: Rectal cancer  INTERVAL HISTORY:   Mr. Batta returns as scheduled.  He continues Xeloda and radiation.  No mouth sores.  Minimal diarrhea.  No hand or foot pain.  He continues to have rectal pain, but this has improved.  He has "burning "with bowel movements.  Objective:  Vital signs in last 24 hours:  Blood pressure 107/79, pulse 67, temperature 97.8 F (36.6 C), temperature source Oral, resp. rate 17, height 5\' 9"  (1.753 m), weight 177 lb 6.4 oz (80.5 kg), SpO2 100 %.    HEENT: No thrush or ulcers Resp: Lungs clear bilaterally Cardio: Regular rate and rhythm GI: No hepatomegaly, no mass, nontender Vascular: No leg edema  Skin: Superficial skin breakdown at the gluteal fold, no skin breakdown at the perineum   Lab Results:  Lab Results  Component Value Date   WBC 3.6 (L) 11/21/2017   HGB 14.2 11/21/2017   HCT 41.0 11/21/2017   MCV 88.7 11/21/2017   PLT 206 11/21/2017   NEUTROABS 2.7 11/21/2017    Medications: I have reviewed the patient's current medications.   Assessment/Plan: 1. Rectal cancer ? Colonoscopy 09/08/2017, rectal mass at 5 cm from the anal verge, biopsy confirmed invasive adenocarcinoma ? Staging CTs 09/15/2017, rectal mass and perirectal lymphadenopathy, indeterminate right upper lobe nodule ? Elevated CEA ? MRI pelvis 10/07/2017-5.3 cm circumferential lower rectal mass with perirectal extension along the inferior mesenteric vein;T3, N1; distance from tumor to the anal sphincter is 5 cm. ? Initiation of radiation and concurrent Xeloda chemotherapy 10/12/2017  2.Pain and bleeding secondary to #1.  Improved.  3.  Skin breakdown at the gluteal fold secondary to radiation   Disposition: Mr. Vandevelde will complete the course of Xeloda and radiation on 11/23/2017.  He appears to be tolerating the treatment well.  He will use a barrier cream at the gluteal fold.  He will return for an office  visit and CEA in 1 month.  We will refer him back to Dr. Dema Severin after the next visit.  15 minutes were spent with the patient today.  The majority of the time was used for counseling and coordination of care.  Betsy Coder, MD  11/21/2017  4:53 PM

## 2017-11-22 ENCOUNTER — Telehealth: Payer: Self-pay | Admitting: Oncology

## 2017-11-22 ENCOUNTER — Ambulatory Visit
Admission: RE | Admit: 2017-11-22 | Discharge: 2017-11-22 | Disposition: A | Discharge status: Home / Self Care | Payer: Medicaid Other | Source: Ambulatory Visit | Attending: Radiation Oncology | Admitting: Radiation Oncology

## 2017-11-22 ENCOUNTER — Ambulatory Visit: Payer: Medicaid Other

## 2017-11-22 DIAGNOSIS — Z510 Encounter for antineoplastic radiation therapy: Secondary | ICD-10-CM | POA: Diagnosis not present

## 2017-11-22 LAB — CEA (IN HOUSE-CHCC): CEA (CHCC-In House): 19.81 ng/mL — ABNORMAL HIGH (ref 0.00–5.00)

## 2017-11-22 NOTE — Telephone Encounter (Signed)
Scheduled appt per 1/7 los - Gave patient calender .

## 2017-11-23 ENCOUNTER — Inpatient Hospital Stay (HOSPITAL_BASED_OUTPATIENT_CLINIC_OR_DEPARTMENT_OTHER): Payer: Medicaid Other | Admitting: Genetic Counselor

## 2017-11-23 ENCOUNTER — Ambulatory Visit
Admission: RE | Admit: 2017-11-23 | Discharge: 2017-11-23 | Disposition: A | Discharge status: Home / Self Care | Payer: Medicaid Other | Source: Ambulatory Visit | Attending: Radiation Oncology | Admitting: Radiation Oncology

## 2017-11-23 ENCOUNTER — Inpatient Hospital Stay: Payer: Medicaid Other

## 2017-11-23 ENCOUNTER — Other Ambulatory Visit: Payer: Self-pay | Admitting: Radiation Oncology

## 2017-11-23 ENCOUNTER — Encounter: Payer: Self-pay | Admitting: Radiation Oncology

## 2017-11-23 ENCOUNTER — Encounter: Payer: Self-pay | Admitting: Genetic Counselor

## 2017-11-23 DIAGNOSIS — R970 Elevated carcinoembryonic antigen [CEA]: Secondary | ICD-10-CM

## 2017-11-23 DIAGNOSIS — K6289 Other specified diseases of anus and rectum: Secondary | ICD-10-CM

## 2017-11-23 DIAGNOSIS — C20 Malignant neoplasm of rectum: Secondary | ICD-10-CM

## 2017-11-23 DIAGNOSIS — Z510 Encounter for antineoplastic radiation therapy: Secondary | ICD-10-CM | POA: Diagnosis not present

## 2017-11-23 DIAGNOSIS — Z7183 Encounter for nonprocreative genetic counseling: Secondary | ICD-10-CM

## 2017-11-23 DIAGNOSIS — Z809 Family history of malignant neoplasm, unspecified: Secondary | ICD-10-CM | POA: Insufficient documentation

## 2017-11-23 DIAGNOSIS — Z79899 Other long term (current) drug therapy: Secondary | ICD-10-CM

## 2017-11-23 MED ORDER — OXYCODONE HCL 5 MG PO TABS
5.0000 mg | ORAL_TABLET | ORAL | 0 refills | Status: DC | PRN
Start: 2017-11-23 — End: 2018-02-14

## 2017-11-23 MED FILL — oxyCODONE HCL 5 MG TABS: 5 | 5 days supply | Qty: 60 | Fill #0

## 2017-11-23 NOTE — Progress Notes (Signed)
REFERRING PROVIDER: Ileana Roup, MD [name], [name] [id number]  PRIMARY PROVIDER:  Betsy Coder, MD  PRIMARY REASON FOR VISIT:  1. Rectal mass   2. Family history of cancer      HISTORY OF PRESENT ILLNESS:   Craig [name], a 57 y.o. male, was seen for a [facility] cancer genetics consultation at the request of Dr. Dema Severin due to a personal and family history of cancer.  Craig [name] presents to clinic today to discuss the possibility of a hereditary predisposition to cancer, genetic testing, and to further clarify his future cancer risks, as well as potential cancer risks for family members.   In 2018, at the age of 16, Craig [name] was diagnosed with rectal cancer. This was treated with chemotherapy and radiation.  He expects that he will have surgery in the next month or so.  Since the patient has only had a biopsy, MSI/IHC have not been performed.      CANCER HISTORY:   No history exists.     Past Medical History:  Diagnosis Date  . Anxiety    Truck driver  . Elevated serum creatinine    1.39 in 06/2012  . Family history of cancer   . Palpitations    + chest pressure; PVCs  . Rectal bleeding 01/07/2017  . Rectal cancer ([facility]) 09/30/2017    Past Surgical History:  Procedure Laterality Date  . COLONOSCOPY N/A 09/08/2017   Procedure: COLONOSCOPY;  Surgeon: Rogene Houston, MD;  Location: [facility];  Service: Endoscopy;  Laterality: N/A;    Social History   Socioeconomic History  . Marital status: Divorced    Spouse name: Not on file  . Number of children: Not on file  . Years of education: Not on file  . Highest education level: Not on file  Social Needs  . Financial resource strain: Not on file  . Food insecurity - worry: Sometimes true  . Food insecurity - inability: Sometimes true  . Transportation needs - medical: No  . Transportation needs - non-medical: No  Occupational History  . Occupation: Truck Geophysicist/field seismologist  Tobacco Use  . Smoking status:  Former Research scientist (life sciences)  . Smokeless tobacco: Never Used  . Tobacco comment: quit at age 8  Substance and Sexual Activity  . Alcohol use: Yes    Comment: occ  . Drug use: No  . Sexual activity: Not on file  Other Topics Concern  . Not on file  Social History Narrative  . Not on file     FAMILY HISTORY:  We obtained a detailed, 4-generation family history.  Significant diagnoses are listed below: Family History  Problem Relation Age of Onset  . Cancer Mother 41       Lung cancer  . Leukemia Sister 105  . Cancer Maternal Aunt     The patient has four children, three daughters and a son. All of his children are cancer free. He had a maternal half sister and a paternal half sister.  His maternal sister was diagnosed with leukemia about 3 years ago.  Both parents are deceased.    The patient's father was not around as he grew up, so there is no information on his family history.  The patient's mother died of lung cancer at 53.  He is unsure if this was a primary cancer or a met from a different cancer.  She had five siblings.  Two sisters had cancer, but the patient is not sure what it  was.  As far as he is aware there are no other cancer cases on the maternal side of the family.  Craig [name] is unaware of previous family history of genetic testing for hereditary cancer risks. Patient's maternal ancestors are of African American descent, and paternal ancestors are of African American descent. There is no reported Ashkenazi Jewish ancestry. There is no known consanguinity.  GENETIC COUNSELING ASSESSMENT: Craig [name] is a 56 y.o. male with a personal history of rectal cancer and family history of cancer which is somewhat suggestive of a sporadic cancer. We, therefore, discussed and recommended the following at today's visit.   DISCUSSION: We discussed that about 5-6% of colon cancer is hereditary with most cases due to Lynch syndrome.  We discussed Lynch syndrome and the cancers that are associated  with these.  We reviewed the characteristics, features and inheritance patterns of hereditary cancer syndromes. We discussed with Craig [name] that the family history is not highly consistent with a familial hereditary cancer syndrome, and we feel he is at low risk to harbor a gene mutation associated with such a condition. Thus, we did not recommend any genetic testing, at this time, and recommended Craig [name] continue to follow the cancer screening guidelines given by his primary healthcare provider.  Craig [name] is having surgery in February.  We discussed that after his surgery, MSI/IHC testing can be performed on his tumor to screen for Lynch syndrome.  While he does not meet criteria for testing right now, if his MSI/IHC testing shows a risk for Lynch syndrome, he would meet criteria for testing at that time.  We will revisit testing after his surgery and his tumor testing is back.    PLAN: Craig [name] did not wish to pursue genetic testing at today's visit. We understand this decision, and remain available to coordinate genetic testing at any time in the future. We; therefore, recommend Craig [name] continue to follow the cancer screening guidelines given by his primary healthcare provider.  We will revisit the option of testing after his surgery and his MSI/IHC testing is completed.  Lastly, we encouraged Craig [name] to remain in contact with cancer genetics annually so that we can continuously update the family history and inform him of any changes in cancer genetics and testing that may be of benefit for this family.   [name] questions were answered to his satisfaction today. Our contact information was provided should additional questions or concerns arise. Thank you for the referral and allowing [place] to share in the care of your patient.   Karen P. Florene Glen, [place], [facility] Certified Genetic Counselor [email or web address] phone: [phone]  The patient was seen for a total of 30 minutes in face-to-face  genetic counseling.  This patient was discussed with Drs. Magrinat, Lindi Adie and/or Burr Medico who agrees with the above.    _______________________________________________________________________ For Office Staff:  Number of people involved in session: 1 Was an Intern/ student involved with case: no

## 2017-11-24 ENCOUNTER — Telehealth: Payer: Self-pay | Admitting: *Deleted

## 2017-11-24 NOTE — Telephone Encounter (Signed)
CALLED PATIENT TO INFORM OF FU WITH ALISON PERKINS ON 01-04-18 @ 2:30 PM, LVM FOR A RETURN CALL

## 2017-11-25 ENCOUNTER — Telehealth: Payer: Self-pay

## 2017-11-25 NOTE — Telephone Encounter (Signed)
LVM for patient to return call to our office.

## 2017-11-29 ENCOUNTER — Encounter: Payer: Self-pay | Admitting: Nurse Practitioner

## 2017-11-29 ENCOUNTER — Telehealth: Payer: Self-pay | Admitting: *Deleted

## 2017-11-29 NOTE — Telephone Encounter (Signed)
Left message informing patient his return to work letter is ready for pick up.

## 2017-12-01 NOTE — Progress Notes (Signed)
°  Radiation Oncology         [phone]) [phone] ________________________________  Name: Craig [name] MRN: [id number]  Date: 11/23/2017  DOB: 03-05-1961  End of Treatment Note  Diagnosis:   57 y.o. male with stage  invasive adenocarcinoma of the rectum  Indication for treatment::  curative       Radiation treatment dates:   10/12/2017 - 11/23/2017  Site/dose:   The rectum was initially treated to 45 Gy in 25 fractions of 1.8 Gy, followed by a 5.4 Gy boost delivered in 3 fractions to yield a total dose of 50.4 Gy, using the 3-D technique.  Narrative: The patient tolerated radiation treatment relatively well.   He experienced some rectal irritation. He reported sharp pain in his rectum with bowel movements that was managed with oxycodone. He denied any rectal bleeding. He reported occasional constipation or diarrhea and denied any nausea or vomiting. He denied any skin issues.  Plan: The patient has completed radiation treatment. The patient will return to radiation oncology clinic for routine followup in one month. I advised the patient to call or return sooner if they have any questions or concerns related to their recovery or treatment. ________________________________  Jodelle Gross, MD, PhD  This document serves as a record of services personally performed by Kyung Rudd, MD. It was created on his behalf by Rae Lips, a trained medical scribe. The creation of this record is based on the scribe's personal observations and the provider's statements to them. This document has been checked and approved by the attending provider.

## 2017-12-19 ENCOUNTER — Ambulatory Visit: Payer: Self-pay | Admitting: Surgery

## 2017-12-22 ENCOUNTER — Inpatient Hospital Stay: Payer: Medicaid Other | Attending: Oncology | Admitting: Nurse Practitioner

## 2017-12-22 ENCOUNTER — Encounter: Payer: Self-pay | Admitting: Nurse Practitioner

## 2017-12-22 ENCOUNTER — Telehealth: Payer: Self-pay | Admitting: Oncology

## 2017-12-22 ENCOUNTER — Inpatient Hospital Stay: Payer: Medicaid Other

## 2017-12-22 VITALS — BP 111/72 | HR 65 | Temp 98.3°F | Resp 17 | Ht 69.0 in | Wt 177.7 lb

## 2017-12-22 DIAGNOSIS — C20 Malignant neoplasm of rectum: Secondary | ICD-10-CM | POA: Diagnosis present

## 2017-12-22 DIAGNOSIS — Z923 Personal history of irradiation: Secondary | ICD-10-CM

## 2017-12-22 DIAGNOSIS — R970 Elevated carcinoembryonic antigen [CEA]: Secondary | ICD-10-CM | POA: Diagnosis not present

## 2017-12-22 DIAGNOSIS — Z9221 Personal history of antineoplastic chemotherapy: Secondary | ICD-10-CM

## 2017-12-22 LAB — CEA (IN HOUSE-CHCC): CEA (CHCC-In House): 3.9 ng/mL (ref 0.00–5.00)

## 2017-12-22 NOTE — Progress Notes (Addendum)
  [place] OFFICE PROGRESS NOTE   Diagnosis: Rectal cancer  INTERVAL HISTORY:   Craig [name] returns as scheduled.  He completed the course of radiation/Xeloda on 11/23/2017.  He is no longer having rectal bleeding.  He denies significant rectal pain.  No hand or foot pain or redness.  No nausea.  Bowels moving regularly.  He reports a good appetite.  He is exercising.  Objective:  Vital signs in last 24 hours:  Blood pressure 111/72, pulse 65, temperature 98.3 F (36.8 C), temperature source Oral, resp. rate 17, height 5\' 9"  (1.753 m), weight 177 lb 11.2 oz (80.6 kg), SpO2 100 %.    HEENT: No thrush or ulcers. Resp: Lungs clear bilaterally. Cardio: Regular rate and rhythm. GI: Abdomen soft and nontender.  No hepatomegaly. Vascular: No leg edema. Skin: No skin breakdown at the perineum or gluteal fold.   Lab Results:  Lab Results  Component Value Date   WBC 3.6 (L) 11/21/2017   HGB 14.2 11/21/2017   HCT 41.0 11/21/2017   MCV 88.7 11/21/2017   PLT 206 11/21/2017   NEUTROABS 2.7 11/21/2017    Imaging:  No results found.  Medications: I have reviewed the patient's current medications.  Assessment/Plan: 1. Rectal cancer ? Colonoscopy 09/08/2017, rectal mass at 5 cm from the anal verge, biopsy confirmed invasive adenocarcinoma ? Staging CTs 09/15/2017, rectal mass and perirectal lymphadenopathy, indeterminate right upper lobe nodule ? Elevated CEA ? MRI pelvis 10/07/2017-5.3 cm circumferential lower rectal mass with perirectal extension along the inferior mesenteric vein;T3, N1; distance from tumor to the anal sphincter is 5 cm. ? Initiation of radiation and concurrent Xeloda chemotherapy 10/12/2017; completion of radiation/Xeloda 11/23/2017.  2.Pain and bleeding secondary to #1.Improved.  3.  Skin breakdown at the gluteal fold secondary to radiation.  Resolved.    Disposition: Craig [name] appears well.  He has completed the course of concurrent  Xeloda/radiation.  He is scheduled for surgery 02/01/2018.  We will see him back approximately 2 weeks after surgery to review the final pathology and discuss any additional treatment recommendations.  He will contact the office in the interim with any problems.  Patient seen with Dr. Benay Spice.    Ned Card ANP/GNP-BC   12/22/2017  3:40 PM This was a shared visit with Ned Card.  Craig [name] has completed neoadjuvant therapy.  The rectal pain has significantly improved.  We will see him after surgery to discuss the indication for adjuvant therapy.  Julieanne Manson, MD

## 2017-12-22 NOTE — Telephone Encounter (Signed)
Gave avs and calendar for april °

## 2018-01-04 ENCOUNTER — Encounter: Payer: Self-pay | Admitting: Radiation Oncology

## 2018-01-04 ENCOUNTER — Ambulatory Visit
Admission: RE | Admit: 2018-01-04 | Discharge: 2018-01-04 | Disposition: A | Discharge status: Home / Self Care | Payer: Medicaid Other | Source: Ambulatory Visit | Attending: Radiation Oncology | Admitting: Radiation Oncology

## 2018-01-04 ENCOUNTER — Other Ambulatory Visit: Payer: Self-pay

## 2018-01-04 VITALS — BP 151/90 | HR 70 | Temp 98.5°F | Resp 20 | Ht 69.0 in | Wt 184.6 lb

## 2018-01-04 DIAGNOSIS — Z79899 Other long term (current) drug therapy: Secondary | ICD-10-CM | POA: Insufficient documentation

## 2018-01-04 DIAGNOSIS — Z9221 Personal history of antineoplastic chemotherapy: Secondary | ICD-10-CM | POA: Insufficient documentation

## 2018-01-04 DIAGNOSIS — C20 Malignant neoplasm of rectum: Secondary | ICD-10-CM | POA: Diagnosis present

## 2018-01-04 DIAGNOSIS — Z923 Personal history of irradiation: Secondary | ICD-10-CM | POA: Insufficient documentation

## 2018-01-04 NOTE — Progress Notes (Signed)
  Radiation Oncology         [phone]) [phone] ________________________________  Name: Craig [name] MRN: [id number]  Date of Service: 01/04/2018 DOB: 06/12/1961  Post Treatment Note  CC: Patient, No Pcp Per  Ileana Roup, MD  Diagnosis:  Stage IIIB, cT3N1 inasive adenocarcinoma of the rectum.  Interval Since Last Radiation: 6 weeks   10/12/2017 - 11/23/2017: The rectum was initially treated to 45 Gy in 25 fractions of 1.8 Gy, followed by a 5.4 Gy boost delivered in 3 fractions to yield a total dose of 50.4 Gy, using the 3-D technique.   Narrative:  The patient returns today for routine follow-up. He tolerated chemoRT and tolerated this well. He is planning to proceed with LAR and diverting ileostomy on 02/01/18. He reports he's been doing well since he completed radiotherapy. He did have issues with diarrhea during treatment, but reports this has improved. No other complaints are noted.                               ALLERGIES:  has No Known Allergies.  Meds: Current Outpatient Medications  Medication Sig Dispense Refill  . acetaminophen (TYLENOL) 325 MG tablet Take 650 mg by mouth every 6 (six) hours as needed for moderate pain or headache.     . oxyCODONE (OXY IR/ROXICODONE) 5 MG immediate release tablet Take 1 tablet (5 mg total) by mouth every 4 (four) hours as needed for severe pain (may take 1-2 tablets every 4 hours prn pain). 60 tablet 0  . docusate sodium (COLACE) 100 MG capsule Take 2 capsules (200 mg total) by mouth daily. (Patient not taking: Reported on 01/04/2018) 60 capsule 0  . polyethylene glycol (MIRALAX / GLYCOLAX) packet Take 17 g by mouth daily. (Patient not taking: Reported on 01/04/2018) 14 each 3  . prochlorperazine (COMPAZINE) 10 MG tablet Take 1 tablet (10 mg total) by mouth every 6 (six) hours as needed for nausea or vomiting. (Patient not taking: Reported on 01/04/2018) 30 tablet 1   No current facility-administered medications for this encounter.      Physical Findings:  height is 5\' 9"  (1.753 m) and weight is 184 lb 9.6 oz (83.7 kg). His oral temperature is 98.5 F (36.9 C). His blood pressure is 151/90 (abnormal) and his pulse is 70. His respiration is 20.  Pain Assessment Pain Score: 0-No pain/10 In general this is a well appearing African American male in no acute distress. He's alert and oriented x4 and appropriate throughout the examination. Cardiopulmonary assessment is negative for acute distress and he exhibits normal effort.   Lab Findings: Lab Results  Component Value Date   WBC 3.6 (L) 11/21/2017   HGB 14.2 11/21/2017   HCT 41.0 11/21/2017   MCV 88.7 11/21/2017   PLT 206 11/21/2017     Radiographic Findings: No results found.  Impression/Plan: 1. Stage IIIB, cT3N1 inasive adenocarcinoma of the rectum. The patient is doing well since completing chemoRT. He will proceed with surgery with Dr. Dema Severin on 02/01/18. We will follow up with his pathology results in conference. He will contact us if he has questions or concerns regarding his previous treatment and we will see him as needed moving forward. He will follow up with Dr. Benay Spice as well.      Carola Rhine, PAC

## 2018-01-27 NOTE — Patient Instructions (Addendum)
Craig [name]  01/27/2018   Your procedure is scheduled on: Wednesday 02/01/2018   Report to [facility] Main  Entrance              Report to admitting at  1015 AM    Call this number if you have problems the morning of surgery [phone]     Remember: Do not eat food  :After Midnight on Monday 01/30/2018   Follow Bowel prep instructions from Dr. Orest Dikes office the day before surgery on Tuesday 01/31/2018 with a clear liquids diet all day up until midnight!  DRINK 2 PRESURGERY ENSURE DRINKS THE NIGHT BEFORE SURGERY AT  1000 PM AND 1 PRESURGERY DRINK THE DAY OF THE PROCEDURE 3 HOURS PRIOR TO SCHEDULED SURGERY. NO SOLINDS AFTER MIDNIGHT THE DAY PRIOR TO THE SURGERY. NOTHING BY MOUTH EXCEPT CLEAR LIQUIDS UNTIL THREE HOURS PRIOR TO SCHEDULED SURGERY. PLEASE FINISH PRESURGERY ENSURE DRINK PER SURGEON ORDER 3 HOURS PRIOR TO SCHEDULED SURGERY TIME WHICH NEEDS TO BE COMPLETED AT 0915 am.   CLEAR LIQUID DIET   Foods Allowed                                                                     Foods Excluded  Coffee and tea, regular and decaf                             liquids that you cannot  Plain Jell-O in any flavor                                             see through such as: Fruit ices (not with fruit pulp)                                     milk, soups, orange juice  Iced Popsicles                                    All solid food Carbonated beverages, regular and diet                                    Cranberry, grape and apple juices Sports drinks like Gatorade Lightly seasoned clear broth or consume(fat free) Sugar, honey syrup  Sample Menu Breakfast                                Lunch                                     Supper Cranberry juice  Beef broth                            Chicken broth Jell-O                                     Grape juice                           Apple juice Coffee or tea                         Jell-O                                      Popsicle                                                Coffee or tea                        Coffee or tea  _____________________________________________________________________              Do not have any liquids after midnight! (the day before surgery)   Take these medicines the morning of surgery with A SIP OF WATER: none                                You may not have any metal on your body including hair pins and              piercings  Do not wear jewelry, make-up, lotions, powders or perfumes, deodorant             Do not wear nail polish.  Do not shave  48 hours prior to surgery.              Men may shave face and neck.   Do not bring valuables to the hospital. [place].  Contacts, dentures or bridgework may not be worn into surgery.  Leave suitcase in the car. After surgery it may be brought to your room.                  Please read over the following fact sheets you were given: _____________________________________________________________________             [facility] - Preparing for Surgery Before surgery, you can play an important role.  Because skin is not sterile, your skin needs to be as free of germs as possible.  You can reduce the number of germs on your skin by washing with CHG (chlorahexidine gluconate) soap before surgery.  CHG is an antiseptic cleaner which kills germs and bonds with the skin to continue killing germs even after washing. Please DO NOT use if you have an allergy to CHG or antibacterial soaps.  If your skin becomes reddened/irritated stop using the CHG and inform your nurse when you arrive at Short Stay. Do not  shave (including legs and underarms) for at least 48 hours prior to the first CHG shower.  You may shave your face/neck. Please follow these instructions carefully:  1.  Shower with CHG Soap the night before surgery and the  morning of  Surgery.  2.  If you choose to wash your hair, wash your hair first as usual with your  normal  shampoo.  3.  After you shampoo, rinse your hair and body thoroughly to remove the  shampoo.                           4.  Use CHG as you would any other liquid soap.  You can apply chg directly  to the skin and wash                       Gently with a scrungie or clean washcloth.  5.  Apply the CHG Soap to your body ONLY FROM THE NECK DOWN.   Do not use on face/ open                           Wound or open sores. Avoid contact with eyes, ears mouth and genitals (private parts).                       Wash face,  Genitals (private parts) with your normal soap.             6.  Wash thoroughly, paying special attention to the area where your surgery  will be performed.  7.  Thoroughly rinse your body with warm water from the neck down.  8.  DO NOT shower/wash with your normal soap after using and rinsing off  the CHG Soap.                9.  Pat yourself dry with a clean towel.            10.  Wear clean pajamas.            11.  Place clean sheets on your bed the night of your first shower and do not  sleep with pets. Day of Surgery : Do not apply any lotions/deodorants the morning of surgery.  Please wear clean clothes to the hospital/surgery center.  FAILURE TO FOLLOW THESE INSTRUCTIONS MAY RESULT IN THE CANCELLATION OF YOUR SURGERY PATIENT SIGNATURE_________________________________  NURSE SIGNATURE__________________________________  ________________________________________________________________________   Craig [name]  An incentive spirometer is a tool that can help keep your lungs clear and active. This tool measures how well you are filling your lungs with each breath. Taking long deep breaths may help reverse or decrease the chance of developing breathing (pulmonary) problems (especially infection) following:  A long period of time when you are unable to move or be active. BEFORE  THE PROCEDURE   If the spirometer includes an indicator to show your best effort, your nurse or respiratory therapist will set it to a desired goal.  If possible, sit up straight or lean slightly forward. Try not to slouch.  Hold the incentive spirometer in an upright position. INSTRUCTIONS FOR USE  1. Sit on the edge of your bed if possible, or sit up as far as you can in bed or on a chair. 2. Hold the incentive spirometer in an upright position. 3. Breathe out normally. 4. Place the mouthpiece in your  mouth and seal your lips tightly around it. 5. Breathe in slowly and as deeply as possible, raising the piston or the ball toward the top of the column. 6. Hold your breath for 3-5 seconds or for as long as possible. Allow the piston or ball to fall to the bottom of the column. 7. Remove the mouthpiece from your mouth and breathe out normally. 8. Rest for a few seconds and repeat Steps 1 through 7 at least 10 times every 1-2 hours when you are awake. Take your time and take a few normal breaths between deep breaths. 9. The spirometer may include an indicator to show your best effort. Use the indicator as a goal to work toward during each repetition. 10. After each set of 10 deep breaths, practice coughing to be sure your lungs are clear. If you have an incision (the cut made at the time of surgery), support your incision when coughing by placing a pillow or rolled up towels firmly against it. Once you are able to get out of bed, walk around indoors and cough well. You may stop using the incentive spirometer when instructed by your caregiver.  RISKS AND COMPLICATIONS  Take your time so you do not get dizzy or light-headed.  If you are in pain, you may need to take or ask for pain medication before doing incentive spirometry. It is harder to take a deep breath if you are having pain. AFTER USE  Rest and breathe slowly and easily.  It can be helpful to keep track of a log of your progress.  Your caregiver can provide you with a simple table to help with this. If you are using the spirometer at home, follow these instructions: [place] IF:   You are having difficultly using the spirometer.  You have trouble using the spirometer as often as instructed.  Your pain medication is not giving enough relief while using the spirometer.  You develop fever of 100.5 F (38.1 C) or higher. SEEK IMMEDIATE MEDICAL CARE IF:   You cough up bloody sputum that had not been present before.  You develop fever of 102 F (38.9 C) or greater.  You develop worsening pain at or near the incision site. MAKE SURE YOU:   Understand these instructions.  Will watch your condition.  Will get help right away if you are not doing well or get worse. Document Released: 03/14/2007 Document Revised: 01/24/2012 Document Reviewed: 05/15/2007 ExitCare Patient Information 2014 ExitCare, [place].   ________________________________________________________________________  WHAT IS A BLOOD TRANSFUSION? Blood Transfusion Information  A transfusion is the replacement of blood or some of its parts. Blood is made up of multiple cells which provide different functions.  Red blood cells carry oxygen and are used for blood loss replacement.  White blood cells fight against infection.  Platelets control bleeding.  Plasma helps clot blood.  Other blood products are available for specialized needs, such as hemophilia or other clotting disorders. BEFORE THE TRANSFUSION  Who gives blood for transfusions?   Healthy volunteers who are fully evaluated to make sure their blood is safe. This is blood bank blood. Transfusion therapy is the safest it has ever been in the practice of medicine. Before blood is taken from a donor, a complete history is taken to make sure that person has no history of diseases nor engages in risky social behavior (examples are intravenous drug use or sexual activity with multiple  partners). The donor's travel history is screened to minimize risk of  transmitting infections, such as malaria. The donated blood is tested for signs of infectious diseases, such as HIV and hepatitis. The blood is then tested to be sure it is compatible with you in order to minimize the chance of a transfusion reaction. If you or a relative donates blood, this is often done in anticipation of surgery and is not appropriate for emergency situations. It takes many days to process the donated blood. RISKS AND COMPLICATIONS Although transfusion therapy is very safe and saves many lives, the main dangers of transfusion include:   Getting an infectious disease.  Developing a transfusion reaction. This is an allergic reaction to something in the blood you were given. Every precaution is taken to prevent this. The decision to have a blood transfusion has been considered carefully by your caregiver before blood is given. Blood is not given unless the benefits outweigh the risks. AFTER THE TRANSFUSION  Right after receiving a blood transfusion, you will usually feel much better and more energetic. This is especially true if your red blood cells have gotten low (anemic). The transfusion raises the level of the red blood cells which carry oxygen, and this usually causes an energy increase.  The nurse administering the transfusion will monitor you carefully for complications. HOME CARE INSTRUCTIONS  No special instructions are needed after a transfusion. You may find your energy is better. Speak with your caregiver about any limitations on activity for underlying diseases you may have. SEEK MEDICAL CARE IF:   Your condition is not improving after your transfusion.  You develop redness or irritation at the intravenous (IV) site. SEEK IMMEDIATE MEDICAL CARE IF:  Any of the following symptoms occur over the next 12 hours:  Shaking chills.  You have a temperature by mouth above 102 F (38.9 C), not  controlled by medicine.  Chest, back, or muscle pain.  People around you feel you are not acting correctly or are confused.  Shortness of breath or difficulty breathing.  Dizziness and fainting.  You get a rash or develop hives.  You have a decrease in urine output.  Your urine turns a dark color or changes to pink, red, or brown. Any of the following symptoms occur over the next 10 days:  You have a temperature by mouth above 102 F (38.9 C), not controlled by medicine.  Shortness of breath.  Weakness after normal activity.  The white part of the eye turns yellow (jaundice).  You have a decrease in the amount of urine or are urinating less often.  Your urine turns a dark color or changes to pink, red, or brown. Document Released: 10/29/2000 Document Revised: 01/24/2012 Document Reviewed: 06/17/2008 [facility] Patient Information 2014 [place], [place].  _______________________________________________________________________

## 2018-01-30 ENCOUNTER — Other Ambulatory Visit: Payer: Self-pay

## 2018-01-30 ENCOUNTER — Encounter (HOSPITAL_COMMUNITY)
Admission: RE | Admit: 2018-01-30 | Discharge: 2018-01-30 | Disposition: A | Discharge status: Home / Self Care | Payer: Medicaid Other | Source: Ambulatory Visit | Attending: Surgery | Admitting: Surgery

## 2018-01-30 ENCOUNTER — Encounter (HOSPITAL_COMMUNITY): Payer: Self-pay

## 2018-01-30 DIAGNOSIS — Z01810 Encounter for preprocedural cardiovascular examination: Secondary | ICD-10-CM | POA: Insufficient documentation

## 2018-01-30 DIAGNOSIS — Z01812 Encounter for preprocedural laboratory examination: Secondary | ICD-10-CM | POA: Diagnosis present

## 2018-01-30 HISTORY — DX: Personal history of urinary calculi: Z87.442

## 2018-01-30 HISTORY — DX: Cardiac arrhythmia, unspecified: I49.9

## 2018-01-30 LAB — COMPREHENSIVE METABOLIC PANEL
ALBUMIN: 3.8 g/dL (ref 3.5–5.0)
ALT: 24 U/L (ref 17–63)
ANION GAP: 9 (ref 5–15)
AST: 28 U/L (ref 15–41)
Alkaline Phosphatase: 126 U/L (ref 38–126)
BUN: 12 mg/dL (ref 6–20)
CALCIUM: 9.1 mg/dL (ref 8.9–10.3)
CO2: 22 mmol/L (ref 22–32)
CREATININE: 1.27 mg/dL — AB (ref 0.61–1.24)
Chloride: 108 mmol/L (ref 101–111)
GFR calc Af Amer: >60 mL/min (ref >60)
GFR calc non Af Amer: >60 mL/min (ref >60)
GLUCOSE: 96 mg/dL (ref 65–99)
Potassium: 4.3 mmol/L (ref 3.5–5.1)
SODIUM: 139 mmol/L (ref 135–145)
Total Bilirubin: 0.6 mg/dL (ref 0.3–1.2)
Total Protein: 7.5 g/dL (ref 6.5–8.1)

## 2018-01-30 LAB — HEMOGLOBIN A1C
Hgb A1c MFr Bld: 5.4 % (ref 4.8–5.6)
Mean Plasma Glucose: 108.28 mg/dL

## 2018-01-30 LAB — CBC WITH DIFFERENTIAL/PLATELET
BASOS ABS: 0 10*3/uL (ref 0.0–0.1)
BASOS PCT: 0 %
EOS ABS: 0.1 10*3/uL (ref 0.0–0.7)
Eosinophils Relative: 3 %
HCT: 43.6 % (ref 39.0–52.0)
HEMOGLOBIN: 15.2 g/dL (ref 13.0–17.0)
Lymphocytes Relative: 18 %
Lymphs Abs: 0.5 10*3/uL — ABNORMAL LOW (ref 0.7–4.0)
MCH: 32 pg (ref 26.0–34.0)
MCHC: 34.9 g/dL (ref 30.0–36.0)
MCV: 91.8 fL (ref 78.0–100.0)
Monocytes Absolute: 0.3 10*3/uL (ref 0.1–1.0)
Monocytes Relative: 11 %
NEUTROS PCT: 68 %
Neutro Abs: 1.8 10*3/uL (ref 1.7–7.7)
Platelets: 218 10*3/uL (ref 150–400)
RBC: 4.75 MIL/uL (ref 4.22–5.81)
RDW: 13.9 % (ref 11.5–15.5)
WBC: 2.7 10*3/uL — AB (ref 4.0–10.5)

## 2018-01-30 MED FILL — metroNIDAZOLE 500 MG TABS: 500 | 1 days supply | Qty: 6 | Fill #0

## 2018-01-30 MED FILL — NEOMYCIN 500 MG TABLET: 500 | 1 days supply | Qty: 6 | Fill #0

## 2018-01-30 NOTE — Progress Notes (Addendum)
Spoke to [place] at [facility] as patient does not have any instructions about a bowel prep. Abigail Butts Faxed Bowel prep instructions to me to review with patient. Reviewed Bowel prep instructions with patient and gave him a copy of the instructions  with patient verbalizing understanding.

## 2018-01-30 NOTE — Consult Note (Signed)
[place] Nurse ostomy consult note  [place] Nurse requested for preoperative stoma site marking  Discussed surgical procedure and stoma creation with patient.  Explained role of the [place] nurse team.  Answered patient questions. Patient declines educational booklet until after surgery.  Examined patient sitting and standing in order to place the marking in the patient's visual field, away from any creases or abdominal contour issues and within the rectus muscle.    Marked for ileostomy in the RUQ  7cm to the right of the umbilicus and  0.1BP above the umbilicus.  Note: Patient wears his belt and underwear just below the umbilicus and there are two large indentions there that will making pouching a challenge. Patient can see the mark and will have a better opportunity to master self care with a RUQ ileostomy.  Patient's abdomen cleansed with CHG wipes at site markings, allowed to air dry prior to marking. Covered mark with thin film transparent dressing to preserve mark until date of surgery.   [place] nursing team will follow and will remain available to this patient, the nursing, surgical  and medical teams.   Anticipated DOS is Wednesday, 02/01/18. Thanks, Maudie Flakes, MSN, RN, [place], Arther Abbott  Pager# [phone]

## 2018-01-31 LAB — ABO/RH: ABO/RH(D): O POS

## 2018-01-31 NOTE — Progress Notes (Signed)
09/16/2017- noted in Epic-CT chest w/contrast  10/07/2017- noted in Epic-MR pelvis w/wo contrast

## 2018-02-01 ENCOUNTER — Inpatient Hospital Stay (HOSPITAL_COMMUNITY): Payer: Medicaid Other

## 2018-02-01 ENCOUNTER — Other Ambulatory Visit: Payer: Self-pay

## 2018-02-01 ENCOUNTER — Encounter (HOSPITAL_COMMUNITY)
Admission: RE | Disposition: A | Discharge status: Home Health | Payer: Self-pay | Source: Ambulatory Visit | Attending: Surgery

## 2018-02-01 ENCOUNTER — Encounter (HOSPITAL_COMMUNITY): Payer: Self-pay | Admitting: *Deleted

## 2018-02-01 ENCOUNTER — Inpatient Hospital Stay (HOSPITAL_COMMUNITY)
Admission: RE | Admit: 2018-02-01 | Discharge: 2018-02-14 | DRG: 329 | Disposition: A | Discharge status: Home Health | Payer: Medicaid Other | Source: Ambulatory Visit | Attending: Surgery | Admitting: Surgery

## 2018-02-01 DIAGNOSIS — Z923 Personal history of irradiation: Secondary | ICD-10-CM | POA: Diagnosis not present

## 2018-02-01 DIAGNOSIS — K651 Peritoneal abscess: Secondary | ICD-10-CM | POA: Diagnosis not present

## 2018-02-01 DIAGNOSIS — Z6826 Body mass index (BMI) 26.0-26.9, adult: Secondary | ICD-10-CM | POA: Diagnosis not present

## 2018-02-01 DIAGNOSIS — Z87442 Personal history of urinary calculi: Secondary | ICD-10-CM

## 2018-02-01 DIAGNOSIS — Z791 Long term (current) use of non-steroidal anti-inflammatories (NSAID): Secondary | ICD-10-CM

## 2018-02-01 DIAGNOSIS — Z79899 Other long term (current) drug therapy: Secondary | ICD-10-CM

## 2018-02-01 DIAGNOSIS — Z9221 Personal history of antineoplastic chemotherapy: Secondary | ICD-10-CM

## 2018-02-01 DIAGNOSIS — F419 Anxiety disorder, unspecified: Secondary | ICD-10-CM | POA: Diagnosis present

## 2018-02-01 DIAGNOSIS — Y848 Other medical procedures as the cause of abnormal reaction of the patient, or of later complication, without mention of misadventure at the time of the procedure: Secondary | ICD-10-CM | POA: Diagnosis not present

## 2018-02-01 DIAGNOSIS — K529 Noninfective gastroenteritis and colitis, unspecified: Secondary | ICD-10-CM | POA: Diagnosis present

## 2018-02-01 DIAGNOSIS — Z87891 Personal history of nicotine dependence: Secondary | ICD-10-CM

## 2018-02-01 DIAGNOSIS — R188 Other ascites: Secondary | ICD-10-CM

## 2018-02-01 DIAGNOSIS — Z801 Family history of malignant neoplasm of trachea, bronchus and lung: Secondary | ICD-10-CM

## 2018-02-01 DIAGNOSIS — Z806 Family history of leukemia: Secondary | ICD-10-CM | POA: Diagnosis not present

## 2018-02-01 DIAGNOSIS — T8143XA Infection following a procedure, organ and space surgical site, initial encounter: Secondary | ICD-10-CM | POA: Diagnosis not present

## 2018-02-01 DIAGNOSIS — E440 Moderate protein-calorie malnutrition: Secondary | ICD-10-CM

## 2018-02-01 DIAGNOSIS — C20 Malignant neoplasm of rectum: Secondary | ICD-10-CM | POA: Diagnosis present

## 2018-02-01 HISTORY — PX: LAPAROSCOPIC LOW ANTERIOR RESECTION: SHX5904

## 2018-02-01 HISTORY — PX: CYSTOSCOPY WITH RETROGRADE PYELOGRAM, URETEROSCOPY AND STENT PLACEMENT: SHX5789

## 2018-02-01 LAB — CBC
HEMATOCRIT: 41.6 % (ref 39.0–52.0)
Hemoglobin: 14 g/dL (ref 13.0–17.0)
MCH: 31.1 pg (ref 26.0–34.0)
MCHC: 33.7 g/dL (ref 30.0–36.0)
MCV: 92.4 fL (ref 78.0–100.0)
Platelets: 202 10*3/uL (ref 150–400)
RBC: 4.5 MIL/uL (ref 4.22–5.81)
RDW: 13.9 % (ref 11.5–15.5)
WBC: 11.3 10*3/uL — ABNORMAL HIGH (ref 4.0–10.5)

## 2018-02-01 LAB — CREATININE, SERUM
Creatinine, Ser: 1.38 mg/dL — ABNORMAL HIGH (ref 0.61–1.24)
GFR calc non Af Amer: 56 mL/min — ABNORMAL LOW (ref >60)

## 2018-02-01 LAB — TYPE AND SCREEN
ABO/RH(D): O POS
Antibody Screen: NEGATIVE

## 2018-02-01 SURGERY — RESECTION, RECTUM, LOW ANTERIOR, LAPAROSCOPIC
Anesthesia: General | Site: Urethra

## 2018-02-01 MED ORDER — PROPOFOL 10 MG/ML IV BOLUS
INTRAVENOUS | Status: DC | PRN
  Administered 2018-02-01: 160 mg via INTRAVENOUS

## 2018-02-01 MED ORDER — ONDANSETRON HCL 4 MG/2ML IJ SOLN: 4.0000 mg | Freq: Once | INTRAMUSCULAR | Status: DC | PRN

## 2018-02-01 MED ORDER — KETOROLAC TROMETHAMINE 15 MG/ML IJ SOLN
15.0000 mg | INTRAMUSCULAR | Status: DC
  Administered 2018-02-01: 15 mg via INTRAVENOUS
  Filled 2018-02-01: qty 1

## 2018-02-01 MED ORDER — DEXAMETHASONE SODIUM PHOSPHATE 10 MG/ML IJ SOLN
INTRAMUSCULAR | Status: AC
  Filled 2018-02-01: qty 1

## 2018-02-01 MED ORDER — FENTANYL CITRATE (PF) 100 MCG/2ML IJ SOLN
INTRAMUSCULAR | Status: AC
  Filled 2018-02-01: qty 2

## 2018-02-01 MED ORDER — ROCURONIUM BROMIDE 10 MG/ML (PF) SYRINGE
PREFILLED_SYRINGE | INTRAVENOUS | Status: AC
  Filled 2018-02-01: qty 5

## 2018-02-01 MED ORDER — ACETAMINOPHEN 500 MG PO TABS
1000.0000 mg | ORAL_TABLET | ORAL | Status: AC
  Administered 2018-02-01: 1000 mg via ORAL
  Filled 2018-02-01: qty 2

## 2018-02-01 MED ORDER — SCOPOLAMINE 1 MG/3DAYS TD PT72
MEDICATED_PATCH | TRANSDERMAL | Status: AC
  Filled 2018-02-01: qty 1

## 2018-02-01 MED ORDER — LIDOCAINE 2% (20 MG/ML) 5 ML SYRINGE
INTRAMUSCULAR | Status: AC
  Filled 2018-02-01: qty 5

## 2018-02-01 MED ORDER — IOHEXOL 300 MG/ML  SOLN
INTRAMUSCULAR | Status: DC | PRN
  Administered 2018-02-01: 10 mL via INTRAVENOUS

## 2018-02-01 MED ORDER — ALUM & MAG HYDROXIDE-SIMETH 200-200-20 MG/5ML PO SUSP: 30.0000 mL | Freq: Four times a day (QID) | ORAL | Status: DC | PRN

## 2018-02-01 MED ORDER — EPHEDRINE 5 MG/ML INJ
INTRAVENOUS | Status: AC
  Filled 2018-02-01: qty 10

## 2018-02-01 MED ORDER — TRAMADOL HCL 50 MG PO TABS
50.0000 mg | ORAL_TABLET | Freq: Four times a day (QID) | ORAL | Status: DC | PRN
  Administered 2018-02-04: 50 mg via ORAL
  Filled 2018-02-01: qty 1

## 2018-02-01 MED ORDER — ONDANSETRON HCL 4 MG PO TABS
4.0000 mg | ORAL_TABLET | Freq: Four times a day (QID) | ORAL | Status: DC | PRN
  Filled 2018-02-01: qty 1

## 2018-02-01 MED ORDER — FENTANYL CITRATE (PF) 250 MCG/5ML IJ SOLN
INTRAMUSCULAR | Status: DC | PRN
  Administered 2018-02-01 (×2): 50 ug via INTRAVENOUS
  Administered 2018-02-01: 100 ug via INTRAVENOUS
  Administered 2018-02-01 (×2): 50 ug via INTRAVENOUS

## 2018-02-01 MED ORDER — CHLORHEXIDINE GLUCONATE CLOTH 2 % EX PADS: 6.0000 | MEDICATED_PAD | Freq: Once | CUTANEOUS | Status: DC

## 2018-02-01 MED ORDER — 0.9 % SODIUM CHLORIDE (POUR BTL) OPTIME
TOPICAL | Status: DC | PRN
  Administered 2018-02-01: 2000 mL

## 2018-02-01 MED ORDER — HEPARIN SODIUM (PORCINE) 5000 UNIT/ML IJ SOLN
5000.0000 [IU] | Freq: Once | INTRAMUSCULAR | Status: AC
  Administered 2018-02-01: 5000 [IU] via SUBCUTANEOUS
  Filled 2018-02-01: qty 1

## 2018-02-01 MED ORDER — FENTANYL CITRATE (PF) 250 MCG/5ML IJ SOLN
INTRAMUSCULAR | Status: AC
  Filled 2018-02-01: qty 5

## 2018-02-01 MED ORDER — KETAMINE HCL 10 MG/ML IJ SOLN
INTRAMUSCULAR | Status: AC
  Filled 2018-02-01: qty 1

## 2018-02-01 MED ORDER — PHENYLEPHRINE 40 MCG/ML (10ML) SYRINGE FOR IV PUSH (FOR BLOOD PRESSURE SUPPORT)
PREFILLED_SYRINGE | INTRAVENOUS | Status: DC | PRN
  Administered 2018-02-01 (×2): 80 ug via INTRAVENOUS

## 2018-02-01 MED ORDER — DIPHENHYDRAMINE HCL 12.5 MG/5ML PO ELIX: 12.5000 mg | ORAL_SOLUTION | Freq: Four times a day (QID) | ORAL | Status: DC | PRN

## 2018-02-01 MED ORDER — HEPARIN SODIUM (PORCINE) 5000 UNIT/ML IJ SOLN
5000.0000 [IU] | Freq: Three times a day (TID) | INTRAMUSCULAR | Status: DC
  Filled 2018-02-01: qty 1

## 2018-02-01 MED ORDER — ALBUMIN HUMAN 5 % IV SOLN
INTRAVENOUS | Status: AC
  Filled 2018-02-01: qty 250

## 2018-02-01 MED ORDER — SCOPOLAMINE 1 MG/3DAYS TD PT72
1.0000 | MEDICATED_PATCH | TRANSDERMAL | Status: DC
  Administered 2018-02-01: 1.5 mg via TRANSDERMAL

## 2018-02-01 MED ORDER — NAPHAZOLINE-GLYCERIN 0.012-0.2 % OP SOLN
1.0000 [drp] | Freq: Four times a day (QID) | OPHTHALMIC | Status: DC | PRN
  Filled 2018-02-01: qty 15

## 2018-02-01 MED ORDER — ENSURE SURGERY PO LIQD
237.0000 mL | Freq: Two times a day (BID) | ORAL | Status: DC
  Administered 2018-02-02 – 2018-02-14 (×10): 237 mL via ORAL
  Filled 2018-02-01 (×26): qty 237

## 2018-02-01 MED ORDER — BUPIVACAINE LIPOSOME 1.3 % IJ SUSP
20.0000 mL | Freq: Once | INTRAMUSCULAR | Status: AC
  Administered 2018-02-01: 20 mL
  Filled 2018-02-01: qty 20

## 2018-02-01 MED ORDER — LIDOCAINE 2% (20 MG/ML) 5 ML SYRINGE
INTRAMUSCULAR | Status: DC | PRN
  Administered 2018-02-01: 1.5 mg/kg/h via INTRAVENOUS

## 2018-02-01 MED ORDER — SODIUM CHLORIDE 0.9 % IJ SOLN
INTRAMUSCULAR | Status: DC | PRN
  Administered 2018-02-01: 20 mL via INTRAVENOUS

## 2018-02-01 MED ORDER — LIDOCAINE HCL 2 % IJ SOLN
INTRAMUSCULAR | Status: AC
  Filled 2018-02-01: qty 20

## 2018-02-01 MED ORDER — DEXAMETHASONE SODIUM PHOSPHATE 10 MG/ML IJ SOLN
INTRAMUSCULAR | Status: DC | PRN
  Administered 2018-02-01: 10 mg via INTRAVENOUS

## 2018-02-01 MED ORDER — OXYMETAZOLINE HCL 0.05 % NA SOLN: 1.0000 | Freq: Every day | NASAL | Status: DC | PRN

## 2018-02-01 MED ORDER — MIDAZOLAM HCL 2 MG/2ML IJ SOLN
INTRAMUSCULAR | Status: AC
  Filled 2018-02-01: qty 2

## 2018-02-01 MED ORDER — SUGAMMADEX SODIUM 200 MG/2ML IV SOLN
INTRAVENOUS | Status: DC | PRN
  Administered 2018-02-01: 200 mg via INTRAVENOUS

## 2018-02-01 MED ORDER — BUPIVACAINE-EPINEPHRINE 0.5% -1:200000 IJ SOLN
INTRAMUSCULAR | Status: AC
  Filled 2018-02-01: qty 1

## 2018-02-01 MED ORDER — KETOROLAC TROMETHAMINE 15 MG/ML IJ SOLN
15.0000 mg | Freq: Four times a day (QID) | INTRAMUSCULAR | Status: DC
  Administered 2018-02-01 – 2018-02-03 (×6): 15 mg via INTRAVENOUS
  Filled 2018-02-01 (×6): qty 1

## 2018-02-01 MED ORDER — ROCURONIUM BROMIDE 10 MG/ML (PF) SYRINGE
PREFILLED_SYRINGE | INTRAVENOUS | Status: DC | PRN
  Administered 2018-02-01: 10 mg via INTRAVENOUS
  Administered 2018-02-01: 50 mg via INTRAVENOUS
  Administered 2018-02-01 (×5): 10 mg via INTRAVENOUS
  Administered 2018-02-01: 20 mg via INTRAVENOUS
  Administered 2018-02-01: 10 mg via INTRAVENOUS

## 2018-02-01 MED ORDER — MIDAZOLAM HCL 5 MG/5ML IJ SOLN
INTRAMUSCULAR | Status: DC | PRN
  Administered 2018-02-01: 2 mg via INTRAVENOUS

## 2018-02-01 MED ORDER — LACTATED RINGERS IV SOLN
INTRAVENOUS | Status: DC
  Administered 2018-02-01: 100 mL/h via INTRAVENOUS
  Administered 2018-02-02 – 2018-02-04 (×2): via INTRAVENOUS
  Administered 2018-02-05: 50 mL/h via INTRAVENOUS
  Administered 2018-02-08 – 2018-02-09 (×4): 1000 mL via INTRAVENOUS

## 2018-02-01 MED ORDER — ACETAMINOPHEN 325 MG PO TABS
650.0000 mg | ORAL_TABLET | Freq: Four times a day (QID) | ORAL | Status: DC
  Administered 2018-02-01 – 2018-02-14 (×49): 650 mg via ORAL
  Filled 2018-02-01 (×49): qty 2

## 2018-02-01 MED ORDER — PHENYLEPHRINE 40 MCG/ML (10ML) SYRINGE FOR IV PUSH (FOR BLOOD PRESSURE SUPPORT)
PREFILLED_SYRINGE | INTRAVENOUS | Status: AC
  Filled 2018-02-01: qty 10

## 2018-02-01 MED ORDER — LACTATED RINGERS IV SOLN
INTRAVENOUS | Status: DC
  Administered 2018-02-01 (×2): via INTRAVENOUS

## 2018-02-01 MED ORDER — FENTANYL CITRATE (PF) 100 MCG/2ML IJ SOLN
25.0000 ug | INTRAMUSCULAR | Status: DC | PRN
  Administered 2018-02-01 (×2): 25 ug via INTRAVENOUS
  Administered 2018-02-01: 50 ug via INTRAVENOUS

## 2018-02-01 MED ORDER — KETAMINE HCL 10 MG/ML IJ SOLN
INTRAMUSCULAR | Status: DC | PRN
  Administered 2018-02-01: 35 mg via INTRAVENOUS

## 2018-02-01 MED ORDER — ONDANSETRON HCL 4 MG/2ML IJ SOLN
INTRAMUSCULAR | Status: AC
  Filled 2018-02-01: qty 2

## 2018-02-01 MED ORDER — HYDROMORPHONE HCL 1 MG/ML IJ SOLN
0.5000 mg | INTRAMUSCULAR | Status: DC | PRN
  Administered 2018-02-01 – 2018-02-07 (×4): 0.5 mg via INTRAVENOUS
  Filled 2018-02-01 (×6): qty 0.5

## 2018-02-01 MED ORDER — KETOROLAC TROMETHAMINE 30 MG/ML IJ SOLN
INTRAMUSCULAR | Status: AC
  Filled 2018-02-01: qty 1

## 2018-02-01 MED ORDER — ALVIMOPAN 12 MG PO CAPS
12.0000 mg | ORAL_CAPSULE | Freq: Two times a day (BID) | ORAL | Status: DC
  Administered 2018-02-02 (×2): 12 mg via ORAL
  Filled 2018-02-01 (×2): qty 1

## 2018-02-01 MED ORDER — ONDANSETRON HCL 4 MG/2ML IJ SOLN
INTRAMUSCULAR | Status: DC | PRN
  Administered 2018-02-01: 4 mg via INTRAVENOUS

## 2018-02-01 MED ORDER — EPHEDRINE SULFATE-NACL 50-0.9 MG/10ML-% IV SOSY
PREFILLED_SYRINGE | INTRAVENOUS | Status: DC | PRN
  Administered 2018-02-01: 10 mg via INTRAVENOUS
  Administered 2018-02-01: 5 mg via INTRAVENOUS
  Administered 2018-02-01: 10 mg via INTRAVENOUS

## 2018-02-01 MED ORDER — LIDOCAINE 2% (20 MG/ML) 5 ML SYRINGE
INTRAMUSCULAR | Status: DC | PRN
  Administered 2018-02-01: 80 mg via INTRAVENOUS

## 2018-02-01 MED ORDER — ALBUMIN HUMAN 5 % IV SOLN
INTRAVENOUS | Status: DC | PRN
  Administered 2018-02-01 (×2): via INTRAVENOUS

## 2018-02-01 MED ORDER — GABAPENTIN 300 MG PO CAPS
300.0000 mg | ORAL_CAPSULE | ORAL | Status: AC
  Administered 2018-02-01: 300 mg via ORAL
  Filled 2018-02-01: qty 1

## 2018-02-01 MED ORDER — ALVIMOPAN 12 MG PO CAPS
12.0000 mg | ORAL_CAPSULE | ORAL | Status: AC
  Administered 2018-02-01: 12 mg via ORAL
  Filled 2018-02-01: qty 1

## 2018-02-01 MED ORDER — HEPARIN SODIUM (PORCINE) 5000 UNIT/ML IJ SOLN
5000.0000 [IU] | Freq: Three times a day (TID) | INTRAMUSCULAR | Status: DC
  Administered 2018-02-02 – 2018-02-09 (×23): 5000 [IU] via SUBCUTANEOUS
  Filled 2018-02-01 (×23): qty 1

## 2018-02-01 MED ORDER — HYDRALAZINE HCL 20 MG/ML IJ SOLN: 10.0000 mg | INTRAMUSCULAR | Status: DC | PRN

## 2018-02-01 MED ORDER — ONDANSETRON HCL 4 MG/2ML IJ SOLN
4.0000 mg | Freq: Four times a day (QID) | INTRAMUSCULAR | Status: DC | PRN
  Administered 2018-02-02 – 2018-02-03 (×2): 4 mg via INTRAVENOUS
  Filled 2018-02-01 (×2): qty 2

## 2018-02-01 MED ORDER — PROPOFOL 10 MG/ML IV BOLUS
INTRAVENOUS | Status: AC
  Filled 2018-02-01: qty 20

## 2018-02-01 MED ORDER — CEFOTETAN DISODIUM-DEXTROSE 2-2.08 GM-%(50ML) IV SOLR
2.0000 g | INTRAVENOUS | Status: AC
  Administered 2018-02-01: 2 g via INTRAVENOUS
  Filled 2018-02-01: qty 50

## 2018-02-01 MED ORDER — DIPHENHYDRAMINE HCL 50 MG/ML IJ SOLN: 12.5000 mg | Freq: Four times a day (QID) | INTRAMUSCULAR | Status: DC | PRN

## 2018-02-01 MED ORDER — SODIUM CHLORIDE 0.9 % IJ SOLN
INTRAMUSCULAR | Status: AC
  Filled 2018-02-01: qty 20

## 2018-02-01 MED ORDER — BUPIVACAINE-EPINEPHRINE 0.5% -1:200000 IJ SOLN
INTRAMUSCULAR | Status: DC | PRN
  Administered 2018-02-01: 10 mL

## 2018-02-01 MED ORDER — STERILE WATER FOR IRRIGATION IR SOLN
Status: DC | PRN
  Administered 2018-02-01: 3000 mL

## 2018-02-01 SURGICAL SUPPLY — 99 items
ADAPTER GOLDBERG URETERAL (ADAPTER) ×4 IMPLANT
APPLIER CLIP 5 13 M/L LIGAMAX5 (MISCELLANEOUS)
APPLIER CLIP ROT 10 11.4 M/L (STAPLE)
BAG URO CATCHER STRL LF (MISCELLANEOUS) ×4 IMPLANT
BARRIER SKIN 2 3/4 (OSTOMY) ×3 IMPLANT
BARRIER SKIN 2 3/4 INCH (OSTOMY) ×1
BASKET LASER NITINOL 1.9FR (BASKET) IMPLANT
BLADE EXTENDED COATED 6.5IN (ELECTRODE) IMPLANT
CABLE HIGH FREQUENCY MONO STRZ (ELECTRODE) ×4 IMPLANT
CATH INTERMIT  6FR 70CM (CATHETERS) ×8 IMPLANT
CATH MUSHROOM 28FR (CATHETERS) IMPLANT
CATH MUSHROOM 30FR (CATHETERS) IMPLANT
CATH ROBINSON RED A/P 16FR (CATHETERS) ×4 IMPLANT
CELLS DAT CNTRL 66122 CELL SVR (MISCELLANEOUS) IMPLANT
CLIP APPLIE 5 13 M/L LIGAMAX5 (MISCELLANEOUS) IMPLANT
CLIP APPLIE ROT 10 11.4 M/L (STAPLE) IMPLANT
CLOTH BEACON ORANGE TIMEOUT ST (SAFETY) ×4 IMPLANT
COVER FOOTSWITCH UNIV (MISCELLANEOUS) IMPLANT
COVER SURGICAL LIGHT HANDLE (MISCELLANEOUS) ×8 IMPLANT
DECANTER SPIKE VIAL GLASS SM (MISCELLANEOUS) ×4 IMPLANT
DERMABOND ADVANCED (GAUZE/BANDAGES/DRESSINGS) ×2
DERMABOND ADVANCED .7 DNX12 (GAUZE/BANDAGES/DRESSINGS) ×2 IMPLANT
DISSECTOR BLUNT TIP ENDO 5MM (MISCELLANEOUS) IMPLANT
DRAIN CHANNEL 19F RND (DRAIN) ×4 IMPLANT
DRAPE SURG IRRIG POUCH 19X23 (DRAPES) ×4 IMPLANT
DRSG OPSITE POSTOP 4X10 (GAUZE/BANDAGES/DRESSINGS) IMPLANT
DRSG OPSITE POSTOP 4X6 (GAUZE/BANDAGES/DRESSINGS) IMPLANT
DRSG OPSITE POSTOP 4X8 (GAUZE/BANDAGES/DRESSINGS) IMPLANT
ELECT REM PT RETURN 15FT ADLT (MISCELLANEOUS) ×4 IMPLANT
EVACUATOR SILICONE 100CC (DRAIN) ×4 IMPLANT
FIBER LASER FLEXIVA 1000 (UROLOGICAL SUPPLIES) IMPLANT
FIBER LASER FLEXIVA 365 (UROLOGICAL SUPPLIES) IMPLANT
FIBER LASER FLEXIVA 550 (UROLOGICAL SUPPLIES) IMPLANT
FIBER LASER TRAC TIP (UROLOGICAL SUPPLIES) IMPLANT
GAUZE SPONGE 4X4 12PLY STRL (GAUZE/BANDAGES/DRESSINGS) IMPLANT
GLOVE BIO SURGEON STRL SZ7.5 (GLOVE) ×20 IMPLANT
GLOVE BIOGEL M STRL SZ7.5 (GLOVE) ×4 IMPLANT
GLOVE ECLIPSE 8.0 STRL XLNG CF (GLOVE) ×12 IMPLANT
GLOVE INDICATOR 8.0 STRL GRN (GLOVE) ×24 IMPLANT
GLOVE SURG SYN 7.5  E (GLOVE) ×4
GLOVE SURG SYN 7.5 E (GLOVE) ×4 IMPLANT
GOWN STRL REUS W/TWL LRG LVL3 (GOWN DISPOSABLE) ×8 IMPLANT
GOWN STRL REUS W/TWL XL LVL3 (GOWN DISPOSABLE) ×24 IMPLANT
GUIDEWIRE ANG ZIPWIRE 038X150 (WIRE) ×4 IMPLANT
GUIDEWIRE STR DUAL SENSOR (WIRE) IMPLANT
HOLDER FOLEY CATH W/STRAP (MISCELLANEOUS) ×4 IMPLANT
LIGASURE IMPACT 36 18CM CVD LR (INSTRUMENTS) IMPLANT
MANIFOLD NEPTUNE II (INSTRUMENTS) ×4 IMPLANT
PACK COLON (CUSTOM PROCEDURE TRAY) ×4 IMPLANT
PACK CYSTO (CUSTOM PROCEDURE TRAY) ×4 IMPLANT
PAD POSITIONING PINK XL (MISCELLANEOUS) ×4 IMPLANT
PORT LAP GEL ALEXIS MED 5-9CM (MISCELLANEOUS) ×4 IMPLANT
POUCH OSTOMY 2 3/4  H 3804 (WOUND CARE) ×2
POUCH OSTOMY 2 PC DRNBL 2.75 (WOUND CARE) ×2 IMPLANT
RTRCTR WOUND ALEXIS 18CM MED (MISCELLANEOUS)
SCISSORS LAP 5X35 DISP (ENDOMECHANICALS) ×4 IMPLANT
SEALER TISSUE G2 STRG ARTC 35C (ENDOMECHANICALS) ×4 IMPLANT
SET IRRIG TUBING LAPAROSCOPIC (IRRIGATION / IRRIGATOR) ×4 IMPLANT
SHEARS HARMONIC ACE PLUS 36CM (ENDOMECHANICALS) IMPLANT
SHEATH ACCESS URETERAL 24CM (SHEATH) IMPLANT
SHEATH ACCESS URETERAL 54CM (SHEATH) IMPLANT
SHEATH URETERAL 12FRX35CM (MISCELLANEOUS) IMPLANT
SLEEVE ADV FIXATION 5X100MM (TROCAR) ×8 IMPLANT
SLEEVE SURGEON STRL (DRAPES) ×8 IMPLANT
SPONGE DRAIN TRACH 4X4 STRL 2S (GAUZE/BANDAGES/DRESSINGS) ×4 IMPLANT
STAPLER CIRC ILS CVD 33MM 37CM (STAPLE) ×4 IMPLANT
STAPLER CUT CVD 40MM GREEN (STAPLE) ×4 IMPLANT
STAPLER CUT RELOAD GREEN (STAPLE) ×4 IMPLANT
STAPLER VISISTAT 35W (STAPLE) IMPLANT
SUT ETHILON 2 0 PS N (SUTURE) ×8 IMPLANT
SUT ETHILON 3 0 PS 1 (SUTURE) IMPLANT
SUT MNCRL AB 4-0 PS2 18 (SUTURE) ×8 IMPLANT
SUT PDS AB 1 CTX 36 (SUTURE) ×8 IMPLANT
SUT PDS AB 1 TP1 96 (SUTURE) IMPLANT
SUT PROLENE 2 0 KS (SUTURE) ×4 IMPLANT
SUT PROLENE 2 0 SH DA (SUTURE) IMPLANT
SUT SILK 2 0 (SUTURE) ×2
SUT SILK 2 0 SH CR/8 (SUTURE) ×4 IMPLANT
SUT SILK 2-0 18XBRD TIE 12 (SUTURE) ×2 IMPLANT
SUT SILK 3 0 (SUTURE) ×2
SUT SILK 3 0 SH CR/8 (SUTURE) ×4 IMPLANT
SUT SILK 3-0 18XBRD TIE 12 (SUTURE) ×2 IMPLANT
SUT VIC AB 3-0 SH 18 (SUTURE) ×12 IMPLANT
SUT VICRYL 0 UR6 27IN ABS (SUTURE) ×4 IMPLANT
SUT VICRYL 2 0 18  UND BR (SUTURE) ×2
SUT VICRYL 2 0 18 UND BR (SUTURE) ×2 IMPLANT
SYR CONTROL 10ML LL (SYRINGE) ×4 IMPLANT
SYS LAPSCP GELPORT 120MM (MISCELLANEOUS)
SYSTEM LAPSCP GELPORT 120MM (MISCELLANEOUS) IMPLANT
TAPE CLOTH SURG 4X10 WHT LF (GAUZE/BANDAGES/DRESSINGS) ×4 IMPLANT
TOWEL OR 17X26 10 PK STRL BLUE (TOWEL DISPOSABLE) IMPLANT
TOWEL OR NON WOVEN STRL DISP B (DISPOSABLE) ×4 IMPLANT
TRAY FOLEY W/METER SILVER 16FR (SET/KITS/TRAYS/PACK) ×4 IMPLANT
TROCAR ADV FIXATION 5X100MM (TROCAR) ×4 IMPLANT
TROCAR XCEL BLUNT TIP 100MML (ENDOMECHANICALS) ×4 IMPLANT
TUBE FEEDING 8FR 16IN STR KANG (MISCELLANEOUS) IMPLANT
TUBING CONNECTING 10 (TUBING) ×3 IMPLANT
TUBING CONNECTING 10' (TUBING) ×1
TUBING INSUF HEATED (TUBING) ×4 IMPLANT

## 2018-02-01 NOTE — Brief Op Note (Signed)
02/01/2018  1:58 PM  PATIENT:  Craig [name]  57 y.o. male  PRE-OPERATIVE DIAGNOSIS:  RECTAL CANCER  POST-OPERATIVE DIAGNOSIS:  * No post-op diagnosis entered *  PROCEDURE:  Procedure(s): LAPAROSCOPIC VERSUS OPEN LOW ANTERIOR RESECTION WITH DIVERTING LOOP ILESTOMY (N/A) CYSTOSCOPY WITH RETROGRADE PYELOGRAM, URETEROSCOPY AND STENT PLACEMENT BILATERAL (Bilateral)  SURGEON:  Surgeon(s) and Role: Panel 1:    * White, Sharon Mt, MD - Primary Panel 2:    Alexis Frock, MD - Primary  PHYSICIAN ASSISTANT:   ASSISTANTS: none   ANESTHESIA:   general  EBL:  minimal   BLOOD ADMINISTERED:none  DRAINS: foley to gravity wtih goldberg adapter to bilateral ureteral catheters.    LOCAL MEDICATIONS USED:  NONE  SPECIMEN:  No Specimen  DISPOSITION OF SPECIMEN:  N/A  COUNTS:  YES  TOURNIQUET:  * No tourniquets in log *  DICTATION: .Other Dictation: Dictation Number [id number]  PLAN OF CARE: remain in OR for colon resection  PATIENT DISPOSITION:  remain in OR 4   Delay start of Pharmacological VTE agent (>24hrs) due to surgical blood loss or risk of bleeding: not applicable

## 2018-02-01 NOTE — Transfer of Care (Signed)
Immediate Anesthesia Transfer of Care Note  Patient: Craig [name]  Procedure(s) Performed: LAPAROSCOPIC  LOW ANTERIOR RECTOSIGMOID RESECTION, COLOANAL ANASTOMOSIS;  DIVERTING LOOP ILESTOMY; FLEXIBLE SIGMOIDOSCOPY (N/A Abdomen) CYSTOSCOPY WITH RETROGRADE PYELOGRAM, URETEROSCOPY AND STENT PLACEMENT BILATERAL (Bilateral Urethra)  Patient Location: PACU  Anesthesia Type:General  Level of Consciousness: awake, alert  and oriented  Airway & Oxygen Therapy: Patient Spontanous Breathing and Patient connected to face mask oxygen  Post-op Assessment: Report given to RN and Post -op Vital signs reviewed and stable  Post vital signs: Reviewed and stable  Last Vitals:  Vitals:   02/01/18 1017  BP: (!) 152/92  Pulse: 92  Resp: 18  Temp: 36.9 C  SpO2: 100%    Last Pain:  Vitals:   02/01/18 1017  TempSrc: Oral      Patients Stated Pain Goal: 4 (79/48/01 6553)  Complications: No apparent anesthesia complications

## 2018-02-01 NOTE — Anesthesia Procedure Notes (Signed)
Procedure Name: Intubation Date/Time: 02/01/2018 1:28 PM Performed by: Talbot Grumbling, CRNA Pre-anesthesia Checklist: Patient identified, Emergency Drugs available, Suction available and Patient being monitored Patient Re-evaluated:Patient Re-evaluated prior to induction Oxygen Delivery Method: Circle system utilized Preoxygenation: Pre-oxygenation with 100% oxygen Induction Type: IV induction Ventilation: Mask ventilation without difficulty Laryngoscope Size: Mac and 3 Grade View: Grade I Tube type: Oral Tube size: 7.5 mm Number of attempts: 1 Airway Equipment and Method: Stylet Placement Confirmation: ETT inserted through vocal cords under direct vision,  positive ETCO2 and breath sounds checked- equal and bilateral Secured at: 23 cm Tube secured with: Tape Dental Injury: Teeth and Oropharynx as per pre-operative assessment

## 2018-02-01 NOTE — H&P (Signed)
CC: Low rectal cancer s/p neoadjuvant chemoXRT here for surgery  HPI: [name] is a very pleasant 30yoM referred to me by Dr. Laural Golden for evaluation of rectal cancer and we initially met him 09/2017. He underwent his first ever colonoscopy 09/08/17. Findings were remarkable for a partially obstructing tumor in the rectum approximate 5 cm needle verge by endoscopic evaluation. This is biopsied and returned invasive adenocarcinoma. The rest of his endoscopic exam was unremarkable.  He subsequently underwent staging CT chest/abdomen/pelvis which was notable for a bulky rectal mass with perirectal nodal metastases, nonspecific right upper lobe nodule but no other evidence of distant metastatic disease.  MRI P 10/07/17 - rectal mass with shortest distance from tumor to anal sphincter being 5 cm; tumor extending on the left and extending superiorly along the course of the IMV. One enlarged pathologic appearing 12m perirectal LN. On imaging this was read as a T3N1 lesion.  He subsequently underwent neoadjuvant chemoradiation with Xeloda under the care of Dr. SBenay Spice- which he completed 11/23/17. He tolerated this therapy well and noted substantial improvement in his symptoms throughout his treatment course. He no longer has partial obstructive symptoms, is tolerating a diet without nausea or vomiting and has noted substantially improved amounts of bleeding with bowel movements. The pelvic pain he experienced has also resolved.  PMH: Denies any prior medical history. PSH: Denies any prior surgical history of abdominal procedures FHx: Denies FHx of malignancy SHx: Denies the use of tobacco/EtOH/drugs.  Allergies No Known Drug Allergies Allergies Reconciled   Medication History Gabapentin (300MG Capsule, 1 (one) Oral three times daily, Taken starting 09/23/2017) Active. Diphenhist (25MG Tablet, 2 TAB Oral) Active. Ibuprofen (400MG Tablet, Oral) Active. Naphcon-A (0.025-0.3% Solution,  Ophthalmic) Active. Oxymeta-12 (0.05% Solution, Nasal) Active. Medications Reconciled   Review of Systems  General Not Present- Anorexia, Appetite Loss and Chills. Skin Not Present- Brittle Nails and Bruising. HEENT Not Present- Double Vision and Headache. Neck Not Present- Neck Mass and Neck Pain. Respiratory Not Present- Bloody sputum, Chronic Cough and Decreased Exercise Tolerance. Cardiovascular Not Present- Chest Pain and Difficulty Breathing Lying Down. Gastrointestinal Present- Bloody Stool and Change in Bowel Habits. Not Present- Abdominal Pain, Bloating, Chronic diarrhea, Constipation, Difficulty Swallowing, Excessive gas, Gets full quickly at meals, Hemorrhoids, Indigestion, Nausea, Rectal Pain and Vomiting. Male Genitourinary Not Present- Blood in Urine and Change in Urinary Stream. Musculoskeletal Not Present- Back Pain and Backache. Neurological Not Present- Decreased Memory and Difficulty Speaking. Psychiatric Not Present- Anxiety and Depression. Endocrine Not Present- Appetite Changes and New Diabetes. Hematology Not Present- Easy Bleeding and Easy Bruising.  Vitals Weight: 181.5 lb Height: 69in Body Surface Area: 1.98 m Body Mass Index: 26.8 kg/m  Temp.: 98.67F  Pulse: 88 (Regular)  BP: 118/82 (Sitting, Left Arm, Standard)   Physical Exam Constitutional: No acute distress; conversant; no deformities Eyes: Moist conjunctiva; no lid lag; anicteric sclerae; pupils equal round and reactive to light Neck: Trachea midline; no palpable thyromegaly Lungs: Normal respiratory effort; no tactile fremitus CV: Regular rate and rhythm; no palpable thrill; no pitting edema GI: Abdomen soft, nontender, nondistended; no palpable hepatosplenomegaly Anorectal: Somewhat bulky palpable mass ~4cm from anal verge. Left anterior, mobile, hemicircumferential. Able to pass with lubricated finger. MSK: Normal gait; no clubbing/cyanosis Psychiatric: Appropriate affect; alert  and oriented 3 Lymphatic: No palpable cervical or axillary lymphadenopathy  Assessment & Plan  Impression: 5M with low rectal cancer now s/p neoadjuvant chemoXRT with Xeloda - completed 11/23/17  -The diagnosis of rectal cancer was discussed at length  with him in the office and today. The physiology of the GI tract was described in detail using pictures and diagrams. Given the preoperative lymph node involvement and persistent mass he is certainly a candidate for surgery. He is now 10wks out from chemoXRT.  -We discussed the preoperative process, operative techniques including minimally invasive and open approaches to surgery for rectal cancer, and postoperative goals and expectations of recovery.  -We discussed laparoscopic versus open low anterior resection with double stapled colorectal anastomosis and diverting loop ileostomy and additionally flexible sigmoidoscopy. We will also plan prophylactic cysto/ureteral stents by urology given prior pelvic XRT.  -The planned procedure, material risks (including, but not limited to, pain, bleeding, infection, scarring, impaired fecal continence/function postoperatively, need for blood transfusion, damage to surrounding structures- blood vessels/nerves/viscus/organs, erectile dysfunction, retrograde ejaculation/infertility, damage to ureter, urine leak, leak from anastomosis, need for additional procedures, need for stoma which may be permanent, hernia, recurrence of cancer, pneumonia, heart attack, stroke, death) benefits and alternatives to surgery were discussed at length. The patient's questions were answered his satisfaction and he elected to proceed with surgery.  Sharon Mt. Dema Severin, M.D. General and Colorectal Surgery [facility]

## 2018-02-01 NOTE — Consult Note (Signed)
Reason for Consult: Advanced Rectal Cancer, Request for Perioperative Ureteral Stenting  Referring Physician: Annye English MD, General Surgery  Craig [name] is an 57 y.o. male.   HPI:   1 - Advanced Rectal Cancer - s/p neoadjuvant chemo-XRT priot to surgical resection of rectal cancer. Given area of primary tumor, general surgery team reqeusts peri-operative ureteral stenting. He does have h/o hematuria eval as child but no pror surgeries or recent GU problems. Most recent pelvic imaging (MRI) w/o gross invastion into GU organs.   Today "Craig [name]" is seen in consultation for peri-operative ureteral stenting.   Past Medical History:  Diagnosis Date  . Anxiety    Truck driver  . Dysrhythmia    palpitations  . Elevated serum creatinine    1.39 in 06/2012  . Family history of cancer   . History of kidney stones   . Palpitations    + chest pressure; PVCs  . Rectal bleeding 01/07/2017  . Rectal cancer ([place]) 09/30/2017    Past Surgical History:  Procedure Laterality Date  . COLONOSCOPY N/A 09/08/2017   Procedure: COLONOSCOPY;  Surgeon: Rogene Houston, MD;  Location: [facility];  Service: Endoscopy;  Laterality: N/A;    Family History  Problem Relation Age of Onset  . Cancer Mother 62       Lung cancer  . Leukemia Sister 3  . Cancer Maternal Aunt     Social History:  reports that he has quit smoking. he has never used smokeless tobacco. He reports that he drinks alcohol. He reports that he does not use drugs.  Allergies: No Known Allergies  Medications: I have reviewed the patient's current medications.  Results for orders placed or performed during the hospital encounter of 01/30/18 (from the past 48 hour(s))  Type and screen All Cardiac and thoracic surgeries, spinal fusions, myomectomies, craniotomies, colon & liver resections, total joint revisions, same day c-section with placenta previa or accreta.     Status: None   Collection Time: 01/30/18  3:35 PM  Result Value  Ref Range   ABO/RH(D) O POS    Antibody Screen NEG    Sample Expiration 02/04/2018    Extend sample reason      NO TRANSFUSIONS OR PREGNANCY IN THE PAST 3 MONTHS Performed at [facility], [place] [address], [place], [place] [address]   ABO/Rh     Status: None   Collection Time: 01/30/18  3:35 PM  Result Value Ref Range   ABO/RH(D)      O POS Performed at [facility], [place] [address], [place], [place] [address]   CBC WITH DIFFERENTIAL     Status: Abnormal   Collection Time: 01/30/18  3:36 PM  Result Value Ref Range   WBC 2.7 (L) 4.0 - 10.5 K/uL   RBC 4.75 4.22 - 5.81 MIL/uL   Hemoglobin 15.2 13.0 - 17.0 g/dL   HCT 43.6 39.0 - 52.0 %   MCV 91.8 78.0 - 100.0 fL   MCH 32.0 26.0 - 34.0 pg   MCHC 34.9 30.0 - 36.0 g/dL   RDW 13.9 11.5 - 15.5 %   Platelets 218 150 - 400 K/uL   Neutrophils Relative % 68 %   Neutro Abs 1.8 1.7 - 7.7 K/uL   Lymphocytes Relative 18 %   Lymphs Abs 0.5 (L) 0.7 - 4.0 K/uL   Monocytes Relative 11 %   Monocytes Absolute 0.3 0.1 - 1.0 K/uL   Eosinophils Relative 3 %   Eosinophils  Absolute 0.1 0.0 - 0.7 K/uL   Basophils Relative 0 %   Basophils Absolute 0.0 0.0 - 0.1 K/uL    Comment: Performed at [facility], [place] [address], [place], [place] [address]  Comprehensive metabolic panel     Status: Abnormal   Collection Time: 01/30/18  3:36 PM  Result Value Ref Range   Sodium 139 135 - 145 mmol/L   Potassium 4.3 3.5 - 5.1 mmol/L   Chloride 108 101 - 111 mmol/L   CO2 22 22 - 32 mmol/L   Glucose, Bld 96 65 - 99 mg/dL   BUN 12 6 - 20 mg/dL   Creatinine, Ser 1.27 (H) 0.61 - 1.24 mg/dL   Calcium 9.1 8.9 - 10.3 mg/dL   Total Protein 7.5 6.5 - 8.1 g/dL   Albumin 3.8 3.5 - 5.0 g/dL   AST 28 15 - 41 U/L   ALT 24 17 - 63 U/L   Alkaline Phosphatase 126 38 - 126 U/L   Total Bilirubin 0.6 0.3 - 1.2 mg/dL   GFR calc non Af Amer >60 >60 mL/min   GFR calc Af Amer >60 >60 mL/min    Comment: (NOTE) The eGFR has  been calculated using the CKD EPI equation. This calculation has not been validated in all clinical situations. eGFR's persistently <60 mL/min signify possible Chronic Kidney Disease.    Anion gap 9 5 - 15    Comment: Performed at [facility], [place] [address], [place], [place] [address]  Hemoglobin A1c     Status: None   Collection Time: 01/30/18  3:36 PM  Result Value Ref Range   Hgb A1c MFr Bld 5.4 4.8 - 5.6 %    Comment: (NOTE) Pre diabetes:          5.7%-6.4% Diabetes:              >6.4% Glycemic control for   <7.0% adults with diabetes    Mean Plasma Glucose 108.28 mg/dL    Comment: Performed at [place] [address], [place], [place] [address]    No results found.  Review of Systems  Constitutional: Negative.  Negative for chills.  HENT: Negative.   Eyes: Negative.   Respiratory: Negative.   Cardiovascular: Negative.   Gastrointestinal: Negative.   Genitourinary: Negative.  Negative for flank pain.  Musculoskeletal: Negative.   Skin: Negative.   Neurological: Negative.   Endo/Heme/Allergies: Negative.   Psychiatric/Behavioral: Negative.    Blood pressure (!) 152/92, pulse 92, temperature 98.4 F (36.9 C), temperature source Oral, resp. rate 18, height _0  (1.753 m), weight 83 kg (183 lb), SpO2 100 %. Physical Exam  Constitutional: He appears well-developed.  In pre-op area with family at bedside.   HENT:  Head: Normocephalic.  Eyes: Pupils are equal, round, and reactive to light.  Neck: Normal range of motion.  Cardiovascular: Normal rate.  Respiratory: Effort normal.  GI: Soft.  Genitourinary: Penis normal.  Genitourinary Comments: No CVAT.   Musculoskeletal: Normal range of motion.  Neurological: He is alert.  Skin: Skin is warm.  Psychiatric: He has a normal mood and affect.    Assessment/Plan:    1 - Advanced Rectal Cancer - agree with perioperative stenting to help in ureteral identification. Risks, benefits,  alternatives, expected peri-operative course discussed in detail.   Craig [name] 02/01/2018, 11:07 AM

## 2018-02-01 NOTE — Brief Op Note (Addendum)
02/01/2018  8:00 PM  PATIENT:  Craig [name]  57 y.o. male  PRE-OPERATIVE DIAGNOSIS:  RECTAL CANCER  POST-OPERATIVE DIAGNOSIS:  LOW RECTAL CANCER  PROCEDURE:  Procedure(s): 1. Laparoscopic ultra-low anterior resection 2. Coloanal anastomosis 3. Takedown of splenic flexure 4. Diverting loop ileostomy 5. Flexible sigmoidoscopy  -Cystoscopy/stents (urology)  SURGEON:  Surgeon(s) and Role: Panel 1:    * Ellysa Parrack, Sharon Mt, MD - Primary    Michael Boston, MD - Assisting Panel 2:    Alexis Frock, MD - Primary  PHYSICIAN ASSISTANT: Eulogio Ditch PA-C  ANESTHESIA:   general  EBL:  100 mL   BLOOD ADMINISTERED:none  DRAINS: 19Fr blake drain, draining pelvis   LOCAL MEDICATIONS USED:  0.5% marcaine with epi and exparel and saline  SPECIMEN:  1. Rectum, sigmoid colon (1 unit) 2. Distal donut - final distal margin 3. Proximal donut  DISPOSITION OF SPECIMEN:  Pathology  COUNTS:  YES  TOURNIQUET:  * No tourniquets in log *  DICTATION: .Dragon Dictation - pending  PLAN OF CARE: Admit to inpatient   PATIENT DISPOSITION:  PACU - hemodynamically stable.   Delay start of Pharmacological VTE agent (>24hrs) due to surgical blood loss or risk of bleeding: no

## 2018-02-01 NOTE — Op Note (Signed)
02/01/2018  9:52 PM  PATIENT:  Craig [name]  57 y.o. male  Patient Care Team: Patient, No Pcp Per as PCP - General (General Practice) Craig [name], Craig Estimable, MD (Cardiology)  PRE-OPERATIVE DIAGNOSIS:  RECTAL CANCER  POST-OPERATIVE DIAGNOSIS:  LOW RECTAL CANCER  PROCEDURE:   1. Laparoscopic ultra-low anterior resection 2. Coloanal anastomosis 3. Takedown of splenic flexure 4. Diverting loop ileostomy 5. Flexible sigmoidoscopy  -Cystoscopy/stents (urology - Dr. Tresa Moore)  SURGEON:  Craig Mt. Dema Severin, MD  ASSISTANT: Craig Boston, MD - An MD assistant was necessary for complex and difficult exposure, retraction and to aid in protection of critical vascular and urogenital structures  ANESTHESIA:   general  COUNTS:  Sponge, needle and instrument counts were reported correct x2 at the conclusion of the operation.  EBL: 100cc  DRAINS: 19 Fr blake drain - draining pelvis  SPECIMEN:  1. Rectum + sigmoid colon - open end proximal 2. Distal donut - final distal margin 3. Proximal donut - blue suture marks proximal donut - also sent on anvil  FINDINGS: Narrow pelvis, large prostate. Rectal cancer 4 cm from anal verge; ultra-low anterior resection - rectum divided 2.5cm proximal to dentate line. Double stapled tension free 37m EEA performed. Flexible sigmoidoscopy demonstrated a hemostatic, pink, patent and air tight anastomosis located 2cm from anal verge. Drain placed draining anastomosis. Diverting loop ileostomy fashioned.  DISPOSITION: PACU in satisfactory condition  INDICATION:  [name] a very pleasant 89yoMreferred to me by Dr. RLaural Goldenfor evaluation of rectal cancer and we initially met him 09/2017. He underwent his first ever colonoscopy 09/08/17. Findings were remarkable for a partially obstructing tumor in the rectum approximate 5 cm needle verge by endoscopic evaluation. This is biopsied and returned invasive adenocarcinoma. The rest of his endoscopic exam was  unremarkable.  He subsequently underwent staging CT chest/abdomen/pelvis which was notable for a bulky rectal mass with perirectal nodal metastases, nonspecific right upper lobe nodule but no other evidence of distant metastatic disease.  MRI P 10/07/17 - rectal mass with shortest distance from tumor to anal sphincter being 5 cm; tumor extending on the left and extending superiorly along the course of the IMV. One enlarged pathologic appearing 862mperirectal LN. On imaging this was read as a T3N1 lesion.  He subsequently underwent neoadjuvant chemoradiation with Xeloda under the care of Dr. ShBenay Spice which he completed 11/23/17. He tolerated this therapy well and noted substantial improvement in his symptoms throughout his treatment course. He no longer has partial obstructive symptoms, is tolerating a diet without nausea or vomiting and has noted substantially improved amounts of bleeding with bowel movements. The pelvic pain he experienced has also resolved.  The diagnosis of rectal cancer was discussed at length with him in the office and today. The physiology of the GI tract was described in detail using pictures and diagrams. Given the preoperative lymph node involvement and persistent mass he is certainly a candidate for surgery. He is now 10wks out from chemoXRT.  We discussed the preoperative process, operative techniques including minimally invasive and open approaches to surgery for rectal cancer, and postoperative goals and expectations of recovery.  We discussed laparoscopic versus open low anterior resection with double stapled colorectal anastomosis and diverting loop ileostomy and additionally flexible sigmoidoscopy. We will also plan prophylactic cysto/ureteral stents by urology given prior pelvic XRT.  The planned procedure, material risks (including, but not limited to, pain, bleeding, infection, scarring, impaired fecal continence/function postoperatively, need for blood  transfusion, damage to surrounding  structures- blood vessels/nerves/viscus/organs, erectile dysfunction, retrograde ejaculation/infertility, damage to ureter, urine leak, leak from anastomosis, need for additional procedures, need for stoma which may be permanent, hernia, recurrence of cancer, pneumonia, heart attack, stroke, death) benefits and alternatives to surgery were discussed at length. The patient's questions were answered his satisfaction and he elected to proceed with surgery.   DESCRIPTION: The patient was identified in preop holding and taken to the OR where he was placed on the operating room table and SCDs were placed. General endotracheal anesthesia was induced without difficulty. The patient was then positioned in lithotomy and all pressure points padded. The hair at all planned surgical sites was clipped as necessary. Dr. Tresa Moore then scrubbed and performed cystoscopy and placement of bilateral ureteral stents which went without issue - see his note for details regarding this portion of the procedure.. The patient was then prepped and draped in the standard sterile fashion. A surgical timeout was performed indicating the correct patient, procedure, positioning and need for preoperative antibiotics.   A small infraumbilical incision was made. The subcutaneous tissue was dissected with electrocautery and the umbilical stalk was identified and grasped with a Kocher and retracted outwardly. The infraumbilical fascia was identified and incised sharply. The peritoneal cavity was then entered bluntly. An 0 vicryl figure-of-eight stay suture was placed.  A Hassan cannula was then inserted into the perineal cavity.  Pneumoperitoneum was established to 15 mmHg with CO2.  The laparoscope was inserted into the peritoneal cavity and inspection revealed no evidence of injury from trocar placement.  The peritoneal linning, overlying omentum and visible small bowel was then surveyed as well as the liver and no  abnormal findings were identified. Bilateral TAP blocks were then performed using a mixture of Marcaine plus Exparel plus saline. 4 additional 5 mm working ports were placed-2 in the right abdomen and 2 in the left abdomen.  The patient was then positioned in steep Trendelenburg with some right side down.  The small bowel was swept out of the pelvis. The sigmoid colon and intraperitoneal rectum were visualized.  The lateral peritoneal attachments of the sigmoid colon to the inner sigmoid fossa were then taken down in the left ureter was readily identified in the intersigmoid fossa.  A medial to lateral approach was then used to mobilize the sigmoid and descending colon.  The proximal rectum and sigmoid colon are grasped and elevated anteriorly.  The peritoneum overlying the sacral promontory was then incised and the avascular plane between the proximal mesorectum and presacral fascia was identified.  This plane was developed laterally identifying both the left ureter containing a stent as well as the gonadal vessels. The ureter was observed and noted to appropriately vermiculate in the retroperitoneum.  The avascular plane between the presacral fascia and fascia propria of the rectum was identified and entered.  The peritoneum was opened cephalad and caudad.  The IMA pedicle was readily identified. This was circumferentially dissected within a centimeter of its takeoff from the aorta. The left ureter was re-inspected and confirmed to be down. The IMA was then ligated using an EnSeal. The small remaining stump was inspected and noted to be hemostatic.  Retracting the rectosigmoid colon cephalad, the pelvic dissection commenced sharply.  Both the left and right ureter were identified and protected during the dissection.  The dissection proceeded posteriorly first.  The hypogastric nerve and his left and right branches were identified and protected free of injury.  Working in the posterior plane deep within the  pelvis,  the rectrosacral (Waldeyer's fascia) was identified and opened as the dissection continued posteriorly. Following near completion of the posterior dissection, dissection commenced laterally followed by anteriorly.  During the lateral dissection, care was taken to stay out of both pelvic sidewalls and working medial to both ureters.  During the anterior dissection, retraction and care was taken to elevate the hypertrophic prostate and seminal vesicles  off the anterior wall of the rectum again staying in the avascular plane.  The bulky portion of the rectum containing the tumor was identified deep in the pelvis. The dissection continued down to the level of the levator fascia the posterior and laterally.  The dissection was met anteriorly at the same level. Digital examination from below was used to confirm the tumor location and the level of dissection.  The dissection terminated at the level of the levators circumferentially. The sphincter muscles were preserved throughout this. The pelvis was far to narrow to accommodate a laparoscopic stapler.  A Pfannenstiel incision was then made 2 fingerbreadths above the pubic bone. The rectus fascia was identified and incised transversely. The rectus muscle was taken down from the overlying fascia. The midline was identified and the peritoneal cavity entered well above the bladder. The rectus muscle was quite bulky and would not allow significant intraperitoneal exposure and therefore was partially divided using an EnSeal. An Alexis wound protector was placed. A [place] retractor was used to assist in exposure of the deep pelvis given the large overlying prostate. A contoured green load stapler was the passed into the pelvis, seated on the rectum and location confirmed >1cm below the tumor and 2.5cm proximal to the dentate line., lying well above the sphincter complex. The stapler was closed and reinspection confirmed no involvement of the urogenital structures  with the staple line. The stapler was then fired. The rectum was then removed out of the pelvis. The location of proximal division on the colon was identified - at the descending/sigmoid junction. The mesentery at this level was divided and a purse-string clamp applied. A 2-0 prolene on a Lanny Hurst need passed. The colon transected and passed off as specimen; open end is proximal. The marginal artery at this level had a palpable pulse in the associated mesentery and the colon edge had bright red bleeding after transection.  EEA sizers were then used and a 70m EEA selected. The purse string was reinforced with 3-0 silk suture. The 374manvil was placed and then purse-string tied. The pursestring inspected and no significant amount of fat or mesentery was pulled into the planned anastomosis. The Alexis cap was replaced and the abdomen reinsufflated. The descending colon was then mobilized along the Dayja Loveridge line of [place] extending past the splenic flexure onto the distal transverse colon. The IMV was ligated just beyond the inferior aspect of the pancreas. The anvil sat freely at the base of the pelvis without any tension. I went below to pass the stapler and my assistant assisted in mating the anvil laparoscopically. The [id number] stapler was passed and the spike deployed. The components mated and the stapler closed under direct visualization with additional retraction to ensure urogenital structures were free of the staple line. The orientation of the colon was reconfirmed and no twisting noted. The stapler was held and fired. The stapler was removed, donuts passed off as specimen and attention turned to the leak test.  A flexible sigmoidoscopy was performed with the pelvis filled with water and the colon proximal to the anastomosis occluded. No air leak  occurred. The anastomosis was inspected and noted to be air tight, pink, patent and hemostatic. The anastomosis was 2cm from the anal verge. The scope was removed.  A  19Fr blake drain was passed into the pelvis. Attention turned to bringing up the ileostomy. The cecum and terminal ileum identified. The ileum 30cm proximal to the ICV was identified. The portion proximal to the clamp on the bowel marked to ensure proper orientation. The skin at the ileostomy site excised in a disc and the underlying subcutaneous tissue. The anterior rectus fascia was incised and the underlying rectus spread with a clamp. Under laparoscopic visualization, the posterior sheath was incised. The ileum passed into the stoma site. The proximal portion was identified and confirmed as such laparoscopically. The mesentery was inspected and no twisting occurred. The mesenteric border incised and a 16Fr red rubber catheter passed.  The trocars were then removed under direct visualization as well as the [place] wound protector.  Gowns and gloves of all participating members of the operating team were then changed and the abdomen was redraped with sterile drapes and a separate closing set was obtained.  Attention was directed at closing the Pfannenstiel incision.  The peritoneum was closed with a running 2-0 Vicryl suture.  The fascia was then approximated using running #1 PDS sutures.  The skin of all incision sites was then closed with 4-0 Monocryl subcuticular sutures.  The incisions were covered with Dermabond.  Blue towels were used to isolate the ileostomy from all other incisions.  The loop of ileum was then opened and the proximal end Brooked with 3-0 Vicryl suture.  The distal limb was secured to the skin with interrupted 3-0 Vicryl sutures.  An ostomy appliance was then cut to size and placed over the stoma.  The patient was then taken out of the lithotomy position, ureteral stents removed, extubated, and transferred to a stretcher for transport to PACU in satisfactory condition.   Note: This dictation was prepared with Dragon/digital dictation along with [employer]. Any  transcriptional errors that result from this process are unintentional.

## 2018-02-01 NOTE — Anesthesia Preprocedure Evaluation (Addendum)
Anesthesia Evaluation  Patient identified by MRN, date of birth, ID band Patient awake    Reviewed: Allergy & Precautions, NPO status , Patient's Chart, lab work & pertinent test results  Airway Mallampati: II  TM Distance: >3 FB Neck ROM: Full    Dental  (+) Dental Advisory Given   Pulmonary former smoker,    breath sounds clear to auscultation       Cardiovascular negative cardio ROS   Rhythm:Regular Rate:Normal     Neuro/Psych Anxiety negative neurological ROS     GI/Hepatic negative GI ROS, Neg liver ROS,   Endo/Other  negative endocrine ROS  Renal/GU Renal InsufficiencyRenal disease     Musculoskeletal   Abdominal   Peds  Hematology negative hematology ROS (+)   Anesthesia Other Findings   Reproductive/Obstetrics                            Lab Results  Component Value Date   WBC 2.7 (L) 01/30/2018   HGB 15.2 01/30/2018   HCT 43.6 01/30/2018   MCV 91.8 01/30/2018   PLT 218 01/30/2018   Lab Results  Component Value Date   CREATININE 1.27 (H) 01/30/2018   BUN 12 01/30/2018   NA 139 01/30/2018   K 4.3 01/30/2018   CL 108 01/30/2018   CO2 22 01/30/2018    Anesthesia Physical Anesthesia Plan  ASA: II  Anesthesia Plan: General   Post-op Pain Management:    Induction: Intravenous  PONV Risk Score and Plan: 4 or greater and Midazolam, Dexamethasone, Ondansetron, Treatment may vary due to age or medical condition and Scopolamine patch - Pre-op  Airway Management Planned: Oral ETT  Additional Equipment:   Intra-op Plan:   Post-operative Plan: Extubation in OR  Informed Consent: I have reviewed the patients History and Physical, chart, labs and discussed the procedure including the risks, benefits and alternatives for the proposed anesthesia with the patient or authorized representative who has indicated his/her understanding and acceptance.   Dental advisory  given  Plan Discussed with: CRNA  Anesthesia Plan Comments:         Anesthesia Quick Evaluation

## 2018-02-02 ENCOUNTER — Encounter (HOSPITAL_COMMUNITY): Payer: Self-pay | Admitting: Surgery

## 2018-02-02 LAB — BASIC METABOLIC PANEL
ANION GAP: 10 (ref 5–15)
BUN: 12 mg/dL (ref 6–20)
CALCIUM: 8.8 mg/dL — AB (ref 8.9–10.3)
CO2: 25 mmol/L (ref 22–32)
Chloride: 104 mmol/L (ref 101–111)
Creatinine, Ser: 1.34 mg/dL — ABNORMAL HIGH (ref 0.61–1.24)
GFR, EST NON AFRICAN AMERICAN: 58 mL/min — AB (ref >60)
GLUCOSE: 133 mg/dL — AB (ref 65–99)
POTASSIUM: 4.2 mmol/L (ref 3.5–5.1)
Sodium: 139 mmol/L (ref 135–145)

## 2018-02-02 LAB — CBC
HEMATOCRIT: 40.4 % (ref 39.0–52.0)
Hemoglobin: 13.4 g/dL (ref 13.0–17.0)
MCH: 30.4 pg (ref 26.0–34.0)
MCHC: 33.2 g/dL (ref 30.0–36.0)
MCV: 91.6 fL (ref 78.0–100.0)
PLATELETS: 216 10*3/uL (ref 150–400)
RBC: 4.41 MIL/uL (ref 4.22–5.81)
RDW: 14 % (ref 11.5–15.5)
WBC: 10.2 10*3/uL (ref 4.0–10.5)

## 2018-02-02 LAB — PHOSPHORUS: Phosphorus: 3.4 mg/dL (ref 2.5–4.6)

## 2018-02-02 LAB — MAGNESIUM: Magnesium: 1.7 mg/dL (ref 1.7–2.4)

## 2018-02-02 NOTE — Consult Note (Signed)
[facility] Nurse ostomy consult note Stoma type/location: RUQ ileostomy (loop) with red rubber ostomy bridge sutured onto itself in place. Stomal assessment/size: 1 and 3/8 inches.  Os at center. Red, moist and budded. Peristomal assessment: intact Treatment options for stomal/peristomal skin: skin barrier ring Output: dark brown liquid effluent Ostomy pouching: 1pc.convex with skin barrier ring Education provided: Extended session with patient, sister, brother and mother in room.  Friend present for part of session, but left for pouch change. GI A&P taught, also stoma and pouch characteristics, including stomal edema and expected path of resolution resulting in need to periodically reassess stoma size using template. Demonstration of skin barrier preparation, removal of old system, discussion of red rubber catheter rod, application of new pouching system including skin barrier ring and pouch closure.  Patient able to give return demo of pouch closure x2.  Demonstration of use of toilet paper wicks for pouch emptying and tail closure cleansing, he will make toilet paper wicks to day and begin emptying with staff assistance with goal of independence being tomorrow with this skill. Educational booklet left in room with two 1-page teaching sheets (pouch application and dietary guidelines).  Discussion pertaining to food blockage and need to thoroughly chew food and drink fluids is met with interest and enthusiasm. Plan to continue teaching tomorrow.  Patient will benefit from [facility] for adjustment in ostomy size, monitoring for intake/output balance, sick day rules for hydration. If you agree, please order/arrange.  CCS, WOC will be happy to remove rod prior to discharge if needed or you may remove in your office. Please order. Enrolled patient in [place] program: No   WOC nursing team will follow, and will remain available to this patient, the nursing, surgical and medical teams.   Thanks, Maudie Flakes, MSN, RN, [place], Arther Abbott  Pager# [phone]

## 2018-02-02 NOTE — Op Note (Signed)
NAMEANH, Craig [name] NO.:  [id number]  MEDICAL RECORD NO.:  [id number]  LOCATION:                                 FACILITY:  PHYSICIAN:  Alexis Frock, MD          DATE OF BIRTH:  DATE OF PROCEDURE: 02/01/2018                               OPERATIVE REPORT   PREOPERATIVE DIAGNOSIS:  Locally advanced rectal cancer undergoing resection today.  Need for perioperative ureteral stenting.  POSTOPERATIVE DIAGNOSIS:  Locally advanced rectal cancer undergoing resection today.  Need for perioperative ureteral stenting.  ESTIMATED BLOOD LOSS:  Nil.  COMPLICATION:  None.  SPECIMEN:  None.  FINDINGS: 1. Unremarkable bladder. 2. Bilateral unremarkable retrograde pyelogram. 3. Successful placement of bilateral ureteral catheters, proximal end     upper ureter, distal end externalized and connected to [facility]     adapter.  DRAINS:  Foley catheter and bilateral ureteral catheters to [facility] adapter.  INDICATION:  [name] is an 57 year old gentleman history of locally advanced rectal cancer.  He is status post neoadjuvant chemotherapy and radiation.  He is undergoing local resection today by the General Surgery team.  Given the patient's location of tumor and locally advanced status required radiation and they have requested preoperative ureteral stenting.  His imaging was reviewed.  He was found to be a suitable candidate.  Informed consent was obtained and placed in the medical record.  PROCEDURE IN DETAIL:  The patient being Craig [name] was verified.  The procedure being cystoscopy, bilateral retrogrades, and bilateral ureteral stent placement was confirmed.  Procedure was carried out. Time-out was performed.  Intravenous antibiotics were administered. General endotracheal anesthesia introduced.  The patient placed into a medium lithotomy position.  Sterile field was created by prepping and draping the penis, perineum, proximal thighs using  iodine. Cystourethroscopy was performed using a 22-French rigid cystoscope with offset lens.  Inspection of the anterior and posterior urethra unremarkable.  Inspection of bladder revealed some mild trabeculation. No papular lesions noted.  The left ureteral orifice was cannulated with a 6-French end-hole catheter and left retrograde pyelogram was obtained.  Left retrograde pyelogram demonstrated a single left ureter with single- system left kidney.  No filling defects or narrowing noted.  The open- ended catheter was advanced to the level of the ureteropelvic junction and set aside.  Using a separate open-ended catheter, right retrograde pyelogram was obtained.  Right retrograde pyelogram demonstrated a single right ureter with single-system right kidney.  No filling defects or narrowing noted. Similarly, the right open-ended catheter was advanced to the level of proximal ureter using fluoroscopic guidance.  This was set aside.  A 16- French Foley catheter was placed per urethra to straight drain, 10 mL of sterile water in the balloon.  The open-ended catheters were fashioned to the Foley using a single silk tie.  Ureteral catheters and Foley catheter were connected through a [employer].  The final spot fluoroscopic images revealed excellent continued placement of bilateral stents to the proximal ureter, and this portion of the procedure was terminated.  The patient tolerated the procedure well.  There were no immediate periprocedural complications.  The patient remained in  the operative suite for his general surgery resection today.          ______________________________ Alexis Frock, MD     TM/MEDQ  D:  02/01/2018  T:  02/02/2018  Job:  [id number]

## 2018-02-02 NOTE — Evaluation (Signed)
Physical Therapy Evaluation Patient Details Name: Craig [name] MRN: [id number] DOB: [birth year] Today's Date: 02/02/2018   History of Present Illness  57 yo male s/p LAR, diverting loop ileostomy 02/01/18.   Clinical Impression  On eval, pt was Min guard assist for mobility. He walked ~250 feet around unit with support of IV pole. Pain rated 5/10. Discussed possible need for RW for ambulation and HHPT, depending on progress. Will continue to follow. Encouraged pt to walk at least 3x/day.     Follow Up Recommendations Home health PT    Equipment Recommendations  Rolling walker with 5" wheels    Recommendations for Other Services       Precautions / Restrictions Precautions Precaution Comments: L side drain; abdominal surgery Restrictions Weight Bearing Restrictions: No      Mobility  Bed Mobility Overal bed mobility: Needs Assistance Bed Mobility: Supine to Sit;Sit to Supine     Supine to sit: Supervision;HOB elevated Sit to supine: Supervision;HOB elevated   General bed mobility comments: for safety, lines. increased time.   Transfers Overall transfer level: Needs assistance   Transfers: Sit to/from Stand Sit to Stand: Min guard;From elevated surface         General transfer comment: close guard for safety. increased time. cues for hand placement  Ambulation/Gait Ambulation/Gait assistance: Min guard Ambulation Distance (Feet): 250 Feet Assistive device: (IV pole) Gait Pattern/deviations: Step-through pattern;Decreased stride length     General Gait Details: slow gait speed. close guard for safety. Pt relied on pole. Discussed RW use for home, depending on progress.  Stairs            Wheelchair Mobility    Modified Rankin (Stroke Patients Only)       Balance Overall balance assessment: Needs assistance           Standing balance-Leahy Scale: Fair                               Pertinent Vitals/Pain Pain Assessment:  0-10 Pain Score: 5  Pain Location: abdomen Pain Descriptors / Indicators: Sore;Operative site guarding    Home Living Family/patient expects to be discharged to:: Private residence Living Arrangements: Alone   Type of Home: Apartment Home Access: Stairs to enter Entrance Stairs-Rails: Right Entrance Stairs-Number of Steps: 1 flight Home Layout: One level Home Equipment: None      Prior Function Level of Independence: Independent               Hand Dominance        Extremity/Trunk Assessment   Upper Extremity Assessment Upper Extremity Assessment: Overall WFL for tasks assessed    Lower Extremity Assessment Lower Extremity Assessment: Overall WFL for tasks assessed    Cervical / Trunk Assessment Cervical / Trunk Assessment: Normal  Communication   Communication: No difficulties  Cognition Arousal/Alertness: Awake/alert Behavior During Therapy: WFL for tasks assessed/performed Overall Cognitive Status: Within Functional Limits for tasks assessed                                        General Comments      Exercises     Assessment/Plan    PT Assessment Patient needs continued PT services  PT Problem List Decreased mobility;Decreased activity tolerance;Decreased balance;Pain;Decreased knowledge of use of DME       PT Treatment Interventions Gait training;Therapeutic activities;Therapeutic  exercise;Patient/family education;DME instruction;Functional mobility training;Stair training    PT Goals (Current goals can be found in the Care Plan section)  Acute Rehab PT Goals Patient Stated Goal: less pain. regain plof. PT Goal Formulation: With patient Time For Goal Achievement: 02/16/18 Potential to Achieve Goals: Good    Frequency Min 3X/week   Barriers to discharge        Co-evaluation               AM-PAC PT "6 Clicks" Daily Activity  Outcome Measure Difficulty turning over in bed (including adjusting bedclothes, sheets  and blankets)?: A Lot Difficulty moving from lying on back to sitting on the side of the bed? : A Lot Difficulty sitting down on and standing up from a chair with arms (e.g., wheelchair, bedside commode, etc,.)?: A Lot Help needed moving to and from a bed to chair (including a wheelchair)?: A Little Help needed walking in hospital room?: A Little Help needed climbing 3-5 steps with a railing? : A Little 6 Click Score: 15    End of Session   Activity Tolerance: Patient tolerated treatment well Patient left: in bed;with call bell/phone within reach;with family/visitor present   PT Visit Diagnosis: Pain;Difficulty in walking, not elsewhere classified (R26.2) Pain - part of body: (abdomen)    Time: 1125-1140 PT Time Calculation (min) (ACUTE ONLY): 15 min   Charges:   PT Evaluation $PT Eval Moderate Complexity: 1 Mod     PT G Codes:         Weston Anna, MPT Pager: [phone]

## 2018-02-02 NOTE — Progress Notes (Signed)
Subjective No acute events. Feeling well. Denies n/v. Tolerating clear liquids without issues. Has been up and walking this morning. Pain well controlled.  Objective: Vital signs in last 24 hours: Temp:  [97.7 F (36.5 C)-99.2 F (37.3 C)] 99 F (37.2 C) (03/21 0513) Pulse Rate:  [69-92] 71 (03/21 0513) Resp:  [14-18] 16 (03/21 0513) BP: (109-152)/(69-92) 109/87 (03/21 0513) SpO2:  [98 %-100 %] 98 % (03/21 0513) Weight:  [83 kg (183 lb)-87.4 kg (192 lb 10.9 oz)] 87.4 kg (192 lb 10.9 oz) (03/21 0300)    Intake/Output from previous day: 03/20 0701 - 03/21 0700 In: 4171.7 [P.O.:180; I.V.:3491.7; IV Piggyback:500] Out: 2370 [Urine:1950; Drains:220; Stool:100; Blood:100] Intake/Output this shift: No intake/output data recorded.  Gen: NAD, comfortable CV: RRR Pulm: Normal work of breathing Abd: Soft, NT/ND; JP serosang; incisions c/d/i; ileostomy pink with some green effluent in bag Ext: SCDs in place  Lab Results: CBC  Recent Labs    02/01/18 2234 02/02/18 0509  WBC 11.3* 10.2  HGB 14.0 13.4  HCT 41.6 40.4  PLT 202 216   BMET Recent Labs    01/30/18 1536 02/01/18 2234 02/02/18 0509  NA 139  --  139  K 4.3  --  4.2  CL 108  --  104  CO2 22  --  25  GLUCOSE 96  --  133*  BUN 12  --  12  CREATININE 1.27* 1.38* 1.34*  CALCIUM 9.1  --  8.8*    Assessment/Plan: Patient Active Problem List   Diagnosis Date Noted  . Family history of cancer   . Rectal cancer ([place]) 09/30/2017  . Rectal bleeding 01/07/2017  . Rectal mass 01/07/2017  . History of diagnostic tests 08/25/2012  . Palpitations   . Anxiety    s/p Procedure(s): LAPAROSCOPIC  LOW ANTERIOR RESECTION, COLOANAL ANASTOMOSIS;  DIVERTING LOOP ILESTOMY; FLEXIBLE SIGMOIDOSCOPY CYSTOSCOPY WITH RETROGRADE PYELOGRAM, URETEROSCOPY AND STENT PLACEMENT BILATERAL 02/01/2018  POD#1 -Advance to full liquids -PT/OT -WOCN/enterostomal RN to be by for teaching -Anticipate will need HH for assistance early on -Ambulate  5x/day -Continue MIVF until reliably tolerating diet -PPx: SQH, SCDs   LOS: 1 day   Sharon Mt. Dema Severin, M.D. General and Colorectal Surgery [facility]

## 2018-02-02 NOTE — Evaluation (Signed)
Occupational Therapy Evaluation Patient Details Name: Craig [name] MRN: [id number] DOB: 1961/07/06 Today's Date: 02/02/2018    History of Present Illness 57 yo male s/p LAR, diverting loop ileostomy 02/01/18.    Clinical Impression   Pt admitted for abdominal surgery. Pt currently with functional limitations due to the deficits listed below (see OT Problem List).  Pt will benefit from skilled OT to increase their safety and independence with ADL and functional mobility for ADL to facilitate discharge to venue listed below.      Follow Up Recommendations  No OT follow up    Equipment Recommendations  3 in 1 bedside commode    Recommendations for Other Services       Precautions / Restrictions Precautions Precaution Comments: L side drain; abdominal surgery Restrictions Weight Bearing Restrictions: No      Mobility Bed Mobility Overal bed mobility: Needs Assistance Bed Mobility: Supine to Sit;Sit to Supine     Supine to sit: Supervision;HOB elevated Sit to supine: Min assist      Transfers Overall transfer level: Needs assistance Equipment used: Rolling walker (2 wheeled) Transfers: Sit to/from Stand Sit to Stand: Min assist         General transfer comment: close guard for safety. increased time. cues for hand placement    Balance Overall balance assessment: Needs assistance           Standing balance-Leahy Scale: Fair                             ADL either performed or assessed with clinical judgement   ADL Overall ADL's : Needs assistance/impaired Eating/Feeding: Set up;Sitting   Grooming: Set up;Sitting   Upper Body Bathing: Minimal assistance;Sitting   Lower Body Bathing: Maximal assistance;Sit to/from stand;Cueing for safety   Upper Body Dressing : Minimal assistance;Sitting   Lower Body Dressing: Sit to/from stand;Maximal assistance       Toileting- Clothing Manipulation and Hygiene: Sit to/from stand;Maximal assistance          General ADL Comments: pt will benefit from AE for LB dressing     Vision Patient Visual Report: No change from baseline       Perception     Praxis      Pertinent Vitals/Pain Pain Score: 4  Pain Location: abdomen Pain Descriptors / Indicators: Sore;Operative site guarding Pain Intervention(s): Limited activity within patient's tolerance;Repositioned        Extremity/Trunk Assessment         Cervical / Trunk Assessment Cervical / Trunk Assessment: Normal   Communication Communication Communication: No difficulties   Cognition Arousal/Alertness: Awake/alert Behavior During Therapy: WFL for tasks assessed/performed Overall Cognitive Status: Within Functional Limits for tasks assessed                                     General Comments   pt will benefit from AE.  Will educate next OT session            [place] expects to be discharged to:: Private residence Living Arrangements: Alone   Type of Home: Apartment Home Access: Stairs to enter Entrance Stairs-Number of Steps: 1 flight Entrance Stairs-Rails: Right Home Layout: One level               Home Equipment: None          Prior Functioning/Environment Level of  Independence: Independent                 OT Problem List: Decreased strength;Decreased activity tolerance;Decreased knowledge of use of DME or AE;Pain      OT Treatment/Interventions: Self-care/ADL training;Patient/family education;DME and/or AE instruction    OT Goals(Current goals can be found in the care plan section) Acute Rehab OT Goals Patient Stated Goal: less pain. regain plof. OT Goal Formulation: With patient Time For Goal Achievement: 02/09/18 Potential to Achieve Goals: Good ADL Goals Pt Will Perform Lower Body Dressing: with supervision;with adaptive equipment;sit to/from stand Pt Will Transfer to Toilet: with supervision;bedside commode;ambulating Pt Will Perform  Toileting - Clothing Manipulation and hygiene: with supervision;sit to/from stand Pt Will Perform Tub/Shower Transfer: Tub transfer;with supervision  OT Frequency: Min 2X/week   Barriers to D/C:            Co-evaluation              AM-PAC PT "6 Clicks" Daily Activity     Outcome Measure Help from another person eating meals?: None Help from another person taking care of personal grooming?: A Little Help from another person toileting, which includes using toliet, bedpan, or urinal?: A Lot Help from another person bathing (including washing, rinsing, drying)?: A Little Help from another person to put on and taking off regular upper body clothing?: A Little Help from another person to put on and taking off regular lower body clothing?: A Lot 6 Click Score: 17   End of Session Equipment Utilized During Treatment: Rolling walker Nurse Communication: Mobility status  Activity Tolerance: Patient tolerated treatment well Patient left: in bed;with call bell/phone within reach;with family/visitor present  OT Visit Diagnosis: Unsteadiness on feet (R26.81);Muscle weakness (generalized) (M62.81);Pain                Time: 5997-7414 OT Time Calculation (min): 13 min Charges:  OT General Charges $OT Visit: 1 Visit OT Evaluation $OT Eval Moderate Complexity: 1 Mod G-Codes:       Bernadine Melecio, Thereasa Parkin 02/02/2018, 7:04 PM

## 2018-02-03 LAB — CBC
HCT: 40.9 % (ref 39.0–52.0)
HEMOGLOBIN: 13.6 g/dL (ref 13.0–17.0)
MCH: 30.7 pg (ref 26.0–34.0)
MCHC: 33.3 g/dL (ref 30.0–36.0)
MCV: 92.3 fL (ref 78.0–100.0)
PLATELETS: 210 10*3/uL (ref 150–400)
RBC: 4.43 MIL/uL (ref 4.22–5.81)
RDW: 14.1 % (ref 11.5–15.5)
WBC: 6.3 10*3/uL (ref 4.0–10.5)

## 2018-02-03 LAB — BASIC METABOLIC PANEL
Anion gap: 8 (ref 5–15)
BUN: 11 mg/dL (ref 6–20)
CHLORIDE: 108 mmol/L (ref 101–111)
CO2: 23 mmol/L (ref 22–32)
Calcium: 8.7 mg/dL — ABNORMAL LOW (ref 8.9–10.3)
Creatinine, Ser: 1.14 mg/dL (ref 0.61–1.24)
Glucose, Bld: 103 mg/dL — ABNORMAL HIGH (ref 65–99)
Potassium: 4.1 mmol/L (ref 3.5–5.1)
SODIUM: 139 mmol/L (ref 135–145)

## 2018-02-03 LAB — MAGNESIUM: MAGNESIUM: 1.8 mg/dL (ref 1.7–2.4)

## 2018-02-03 LAB — PHOSPHORUS: PHOSPHORUS: 1.9 mg/dL — AB (ref 2.5–4.6)

## 2018-02-03 MED ORDER — IBUPROFEN 200 MG PO TABS
600.0000 mg | ORAL_TABLET | Freq: Four times a day (QID) | ORAL | Status: DC
  Administered 2018-02-03 – 2018-02-04 (×2): 600 mg via ORAL
  Filled 2018-02-03 (×2): qty 3

## 2018-02-03 NOTE — Anesthesia Postprocedure Evaluation (Signed)
Anesthesia Post Note  Patient: CHRISTIANJAMES SOULE  Procedure(s) Performed: LAPAROSCOPIC  LOW ANTERIOR RECTOSIGMOID RESECTION, COLOANAL ANASTOMOSIS;  DIVERTING LOOP ILESTOMY; FLEXIBLE SIGMOIDOSCOPY (N/A Abdomen) CYSTOSCOPY WITH RETROGRADE PYELOGRAM, URETEROSCOPY AND URETERAL CATHETERS BILATERAL (Bilateral Urethra)     Patient location during evaluation: PACU Anesthesia Type: General Level of consciousness: awake and alert Pain management: pain level controlled Vital Signs Assessment: post-procedure vital signs reviewed and stable Respiratory status: spontaneous breathing, nonlabored ventilation, respiratory function stable and patient connected to nasal cannula oxygen Cardiovascular status: blood pressure returned to baseline and stable Postop Assessment: no apparent nausea or vomiting Anesthetic complications: no    Last Vitals:  Vitals:   02/03/18 0410 02/03/18 1424  BP: 109/85 123/74  Pulse: 69 70  Resp: 18 17  Temp: 37.1 C 37.3 C  SpO2: 98% 99%    Last Pain:  Vitals:   02/03/18 1424  TempSrc: Oral  PainSc:                  Tiajuana Amass

## 2018-02-03 NOTE — Progress Notes (Addendum)
Subjective No acute events. Feeling well. Denies n/v. Tolerating full liquids without issues. Has been up and walking. Pain well controlled.  Objective: Vital signs in last 24 hours: Temp:  [98.2 F (36.8 C)-98.8 F (37.1 C)] 98.8 F (37.1 C) (03/22 0410) Pulse Rate:  [61-78] 69 (03/22 0410) Resp:  [16-18] 18 (03/22 0410) BP: (107-129)/(63-85) 109/85 (03/22 0410) SpO2:  [97 %-99 %] 98 % (03/22 0410) Weight:  [84.4 kg (186 lb 1.1 oz)] 84.4 kg (186 lb 1.1 oz) (03/22 0500) Last BM Date: 02/02/18  Intake/Output from previous day: 03/21 0701 - 03/22 0700 In: 2500 [P.O.:600; I.V.:1900] Out: 8588 [Urine:7603; Drains:235; Stool:750] Intake/Output this shift: No intake/output data recorded.  Gen: NAD, comfortable CV: RRR Pulm: Normal work of breathing Abd: Soft, NT/ND; JP serosang; incisions c/d/i; ileostomy pink with some green effluent in bag Ext: SCDs in place  Lab Results: CBC  Recent Labs    02/02/18 0509 02/03/18 0416  WBC 10.2 6.3  HGB 13.4 13.6  HCT 40.4 40.9  PLT 216 210   BMET Recent Labs    02/02/18 0509 02/03/18 0416  NA 139 139  K 4.2 4.1  CL 104 108  CO2 25 23  GLUCOSE 133* 103*  BUN 12 11  CREATININE 1.34* 1.14  CALCIUM 8.8* 8.7*    Assessment/Plan: Patient Active Problem List   Diagnosis Date Noted  . Family history of cancer   . Rectal cancer ([facility]) 09/30/2017  . Rectal bleeding 01/07/2017  . Rectal mass 01/07/2017  . History of diagnostic tests 08/25/2012  . Palpitations   . Anxiety    s/p Procedure(s): LAPAROSCOPIC  LOW ANTERIOR RESECTION, COLOANAL ANASTOMOSIS;  DIVERTING LOOP ILESTOMY; FLEXIBLE SIGMOIDOSCOPY CYSTOSCOPY WITH RETROGRADE PYELOGRAM, URETEROSCOPY AND STENT PLACEMENT BILATERAL 02/01/2018  POD#2 -Advance to soft diet - monitor stoma output for 48hrs on soft diet to ensure output well controlled. If issues, will plan to start with metamucil and imodium 2mg TID; goal is <1.2L/24hrs -D/C foley -Stoma rod to remain in place until  day of discharge - red rubber -Cont JP drain - 235cc/24hrs ss -PT/OT -WOCN/enterostomal RN for teaching and training on emptying and recording output -Will need HH for assistance early on  - care management consult placed as well as face2face -Ambulate 5x/day -PPx: SQH, SCDs -Dispo: Anticipate possible discharge Sunday or Monday if all of the above goals are met and he continues to do well   LOS: 2 days   Christopher M. White, M.D. General and Colorectal Surgery [facility] 

## 2018-02-03 NOTE — Progress Notes (Signed)
Received a referral for [facility] for ileostomy care, contacted 5 agencies and received denials. Contacted [facility] for referral and they were able to accept the patient with start of care 3/26. Made nurse and patient aware. [phone]

## 2018-02-03 NOTE — Progress Notes (Signed)
Physical Therapy Treatment Patient Details Name: Craig [name] MRN: [id number] DOB: November 01, 1961 Today's Date: 02/03/2018    History of Present Illness 57 yo male s/p LAR, diverting loop ileostomy 02/01/18.     PT Comments    Progressing with mobility.    Follow Up Recommendations  Home health PT     Equipment Recommendations  Rolling walker with 5" wheels    Recommendations for Other Services       Precautions / Restrictions Precautions Precaution Comments: L side drain; abdominal surgery Restrictions Weight Bearing Restrictions: No    Mobility  Bed Mobility Overal bed mobility: Needs Assistance Bed Mobility: Supine to Sit;Sit to Supine     Supine to sit: HOB elevated;Supervision Sit to supine: HOB elevated;Min guard   General bed mobility comments: for safety, lines. increased time.   Transfers Overall transfer level: Needs assistance   Transfers: Sit to/from Stand Sit to Stand: Min guard;From elevated surface         General transfer comment: close guard for safety. increased time. cues for hand placement. Pt prefers to pull up on IV pole.   Ambulation/Gait Ambulation/Gait assistance: Min guard Ambulation Distance (Feet): 250 Feet Assistive device: (IV pole) Gait Pattern/deviations: Step-through pattern;Decreased stride length     General Gait Details: slow gait speed. close guard for safety. Pt relied on pole. Discussed RW use for home, depending on progress.   Stairs            Wheelchair Mobility    Modified Rankin (Stroke Patients Only)       Balance                                            Cognition Arousal/Alertness: Awake/alert Behavior During Therapy: WFL for tasks assessed/performed Overall Cognitive Status: Within Functional Limits for tasks assessed                                        Exercises      General Comments        Pertinent Vitals/Pain Pain Assessment: 0-10 Pain  Score: 6  Pain Location: abdomen Pain Descriptors / Indicators: Sore;Operative site guarding Pain Intervention(s): Monitored during session;Repositioned;Ice applied    Home Living                      Prior Function            PT Goals (current goals can now be found in the care plan section) Progress towards PT goals: Progressing toward goals    Frequency    Min 3X/week      PT Plan Current plan remains appropriate    Co-evaluation              AM-PAC PT "6 Clicks" Daily Activity  Outcome Measure  Difficulty turning over in bed (including adjusting bedclothes, sheets and blankets)?: A Lot Difficulty moving from lying on back to sitting on the side of the bed? : A Lot Difficulty sitting down on and standing up from a chair with arms (e.g., wheelchair, bedside commode, etc,.)?: A Lot Help needed moving to and from a bed to chair (including a wheelchair)?: A Little Help needed walking in hospital room?: A Little Help needed climbing 3-5 steps with a railing? : A Little  6 Click Score: 15    End of Session   Activity Tolerance: Patient tolerated treatment well Patient left: in bed;with call bell/phone within reach   PT Visit Diagnosis: Pain;Difficulty in walking, not elsewhere classified (R26.2) Pain - part of body: (abd)     Time: 0955-1010 PT Time Calculation (min) (ACUTE ONLY): 15 min  Charges:  $Gait Training: 8-22 mins                    G Codes:          Weston Anna, MPT Pager: [phone]

## 2018-02-03 NOTE — Progress Notes (Signed)
Occupational Therapy Treatment Patient Details Name: CHRISTAPHER GILLIAN MRN: [id number] DOB: 11-29-1960 Today's Date: 02/03/2018    History of present illness 57 yo male s/p LAR, diverting loop ileostomy 02/01/18.    OT comments  Educated on AE and issued kit to him today  Follow Up Recommendations  No OT follow up    Equipment Recommendations  (pt with ileostomy; likely won't need toilet DME)    Recommendations for Other Services      Precautions / Restrictions Precautions Precaution Comments: L side drain; abdominal surgery Restrictions Weight Bearing Restrictions: No       Mobility Bed Mobility Overal bed mobility: Needs Assistance Bed Mobility: Supine to Sit;Sit to Supine Rolling: Mod assist Sidelying to sit: Mod assist   Sit to supine: HOB elevated;Min guard   General bed mobility comments: got up via sidelying and back to bed with HOB raised.  Transfers Overall transfer level: Needs assistance   Transfers: Sit to/from Stand Sit to Stand: Min guard;From elevated surface         General transfer comment: close guard for safety. increased time. cues for hand placement. Pt prefers to pull up on IV pole.     Balance                                           ADL either performed or assessed with clinical judgement   ADL               Lower Body Bathing: Minimal assistance;Sit to/from stand;With adaptive equipment       Lower Body Dressing: Minimal assistance;Sit to/from stand;With adaptive equipment                 General ADL Comments: educated on AE; practiced with reacher and sock aide for socks and simulated pants.  Issued these to him including wide sock aide     Vision       Perception     Praxis      Cognition Arousal/Alertness: Awake/alert Behavior During Therapy: WFL for tasks assessed/performed Overall Cognitive Status: Within Functional Limits for tasks assessed                                          Exercises     Shoulder Instructions       General Comments      Pertinent Vitals/ Pain       Pain Assessment: 0-10 Pain Score: 6  Pain Location: abdomen Pain Descriptors / Indicators: Sore;Operative site guarding Pain Intervention(s): Monitored during session;Repositioned;Ice applied  Home Living                                          Prior Functioning/Environment              Frequency  Min 2X/week        Progress Toward Goals  OT Goals(current goals can now be found in the care plan section)  Progress towards OT goals: Progressing toward goals  Acute Rehab OT Goals Patient Stated Goal: less pain. regain plof.  Plan      Co-evaluation  AM-PAC PT "6 Clicks" Daily Activity     Outcome Measure   Help from another person eating meals?: None Help from another person taking care of personal grooming?: A Little Help from another person toileting, which includes using toliet, bedpan, or urinal?: A Lot Help from another person bathing (including washing, rinsing, drying)?: A Little Help from another person to put on and taking off regular upper body clothing?: A Little Help from another person to put on and taking off regular lower body clothing?: A Little 6 Click Score: 18    End of Session    OT Visit Diagnosis: Unsteadiness on feet (R26.81);Muscle weakness (generalized) (M62.81);Pain   Activity Tolerance Patient tolerated treatment well   Patient Left in bed;with call bell/phone within reach;with family/visitor present   Nurse Communication          Time: 1150-1210 OT Time Calculation (min): 20 min  Charges: OT General Charges $OT Visit: 1 Visit OT Treatments $Self Care/Home Management : 8-22 mins  Lesle Chris, OTR/L [phone] 02/03/2018   Garnet Overfield 02/03/2018, 12:43 PM

## 2018-02-03 NOTE — Progress Notes (Signed)
Pharmacy Brief Note - Alvimopan (Entereg)  The standing order set for alvimopan (Entereg) now includes an automatic order to discontinue the drug after the patient has had a bowel movement.  The change was approved by the [facility] and the Medical Executive Committee.    This patient has had 750 mL ileostomy output.  Orders clarified with Dr. Dema Severin - OK to d/c alvimopan.  If there are questions, please contact the pharmacy at [phone].  Thank you  Gretta Arab PharmD, BCPS Pager [phone] 02/03/2018 8:58 AM

## 2018-02-03 NOTE — Consult Note (Signed)
[facility] Nurse ostomy follow up Stoma type/location:  RUQ ileostomy Stomal assessment/size: 1 and 3/8 inches. Red rubber catheter "rod" in place. Pouch applied yesterday is intact.  Peristomal assessment: Note seen today Treatment options for stomal/peristomal skin: skin barrier ring Output: green effluent Ostomy pouching: 1pc convex with skin barrier ring.  Education provided: Patient is emptying ostomy pouch with assistance and cleaning bottom 2-inches of tail closure independently with toilet paper "wicks".  Supplies provided for discharge  Are at bedside (Two 2-inch convex and five 1 and 1/2 inch convex as well as 7 skin barrier rings).   Enrolled patient in [place] Start Discharge program: Yes, today  [facility] nursing team will follow, and will remain available to this patient, the nursing and medical teams.  If patient to discharge on Sunday, I will be happy to come to hospital and remove bridge prior to his departure for home. ([facility] services are typically not available on weekends.) Thanks, Maudie Flakes, MSN, RN, Chancy Milroy, Arther Abbott  Pager# [phone]

## 2018-02-04 LAB — BASIC METABOLIC PANEL
Anion gap: 8 (ref 5–15)
BUN: 11 mg/dL (ref 6–20)
CALCIUM: 8.9 mg/dL (ref 8.9–10.3)
CO2: 24 mmol/L (ref 22–32)
CREATININE: 1.27 mg/dL — AB (ref 0.61–1.24)
Chloride: 107 mmol/L (ref 101–111)
Glucose, Bld: 108 mg/dL — ABNORMAL HIGH (ref 65–99)
Potassium: 3.9 mmol/L (ref 3.5–5.1)
SODIUM: 139 mmol/L (ref 135–145)

## 2018-02-04 LAB — CBC
HEMATOCRIT: 40.2 % (ref 39.0–52.0)
Hemoglobin: 13.1 g/dL (ref 13.0–17.0)
MCH: 30.4 pg (ref 26.0–34.0)
MCHC: 32.6 g/dL (ref 30.0–36.0)
MCV: 93.3 fL (ref 78.0–100.0)
Platelets: 220 10*3/uL (ref 150–400)
RBC: 4.31 MIL/uL (ref 4.22–5.81)
RDW: 14 % (ref 11.5–15.5)
WBC: 6.2 10*3/uL (ref 4.0–10.5)

## 2018-02-04 LAB — MAGNESIUM: MAGNESIUM: 2 mg/dL (ref 1.7–2.4)

## 2018-02-04 LAB — PHOSPHORUS: PHOSPHORUS: 3.2 mg/dL (ref 2.5–4.6)

## 2018-02-04 MED ORDER — IBUPROFEN 200 MG PO TABS
600.0000 mg | ORAL_TABLET | Freq: Four times a day (QID) | ORAL | Status: DC | PRN
  Administered 2018-02-04 – 2018-02-06 (×2): 600 mg via ORAL
  Filled 2018-02-04 (×3): qty 3

## 2018-02-04 NOTE — Progress Notes (Signed)
Patient ID: Craig [name], male   DOB: 04/20/1961, 57 y.o.   MRN: [id number] 3 Days Post-Op   Subjective: No complaints today.  Tolerating regular diet.  Occasional mild nausea.  Ostomy output appropriate.  Denies pain.  Objective: Vital signs in last 24 hours: Temp:  [98.4 F (36.9 C)-99.1 F (37.3 C)] 98.4 F (36.9 C) (03/23 0546) Pulse Rate:  [67-70] 67 (03/23 0546) Resp:  [17] 17 (03/23 0546) BP: (117-132)/(74-83) 132/83 (03/23 0546) SpO2:  [97 %-99 %] 97 % (03/23 0546) Weight:  [85.3 kg (188 lb 0.8 oz)] 85.3 kg (188 lb 0.8 oz) (03/23 0546) Last BM Date: 02/03/18  Intake/Output from previous day: 03/22 0701 - 03/23 0700 In: 2292.5 [P.O.:480; I.V.:1812.5] Out: 2535 [Urine:1750; Drains:160; Stool:625] Intake/Output this shift: No intake/output data recorded.  General appearance: alert, cooperative and no distress GI: normal findings: soft, non-tender and Nondistended Incision/Wound: No erythema or drainage.  Stoma healthy with semisolid output  Lab Results:  Recent Labs    02/03/18 0416 02/04/18 0443  WBC 6.3 6.2  HGB 13.6 13.1  HCT 40.9 40.2  PLT 210 220   BMET Recent Labs    02/03/18 0416 02/04/18 0443  NA 139 139  K 4.1 3.9  CL 108 107  CO2 23 24  GLUCOSE 103* 108*  BUN 11 11  CREATININE 1.14 1.27*  CALCIUM 8.7* 8.9     Studies/Results: No results found.  Anti-infectives: Anti-infectives (From admission, onward)   Start     Dose/Rate Route Frequency Ordered Stop   02/01/18 1005  cefoTEtan in Dextrose 5% (CEFOTAN) IVPB 2 g     2 g Intravenous On call to O.R. 02/01/18 1005 02/01/18 1345      Assessment/Plan: s/p Procedure(s): LAPAROSCOPIC  LOW ANTERIOR RECTOSIGMOID RESECTION, COLOANAL ANASTOMOSIS;  DIVERTING LOOP ILESTOMY; FLEXIBLE SIGMOIDOSCOPY CYSTOSCOPY WITH RETROGRADE PYELOGRAM, URETEROSCOPY AND URETERAL CATHETERS BILATERAL Doing very well postoperatively without apparent complication.  Anticipate discharge tomorrow.   LOS: 3 days     Edward Jolly 02/04/2018

## 2018-02-04 NOTE — Progress Notes (Signed)
Occupational Therapy Treatment Patient Details Name: Craig [name] MRN: [id number] DOB: 14-May-1961 Today's Date: 02/04/2018    History of present illness 57 yo male s/p LAR, diverting loop ileostomy 02/01/18.    OT comments  Pt may d/c home tomorrow.  Reinforced tub transfer technique, bed mobility via sidelying, and AE. Pt states that either girlfriend or someone will be home with him.  He has a high bed:  Educated that he can back onto a single sturdy step stool to situate himself further back onto bed.  Follow Up Recommendations  Supervision - Intermittent;No OT follow up    Equipment Recommendations  None recommended by OT    Recommendations for Other Services      Precautions / Restrictions Precautions Precaution Comments: L side drain; abdominal surgery Restrictions Weight Bearing Restrictions: No       Mobility Bed Mobility     Rolling: Min guard Sidelying to sit: Min assist       General bed mobility comments: reinforced bed mobility in preparation for home  Transfers   Equipment used: None   Sit to Stand: Min guard         General transfer comment: for safety    Balance                                           ADL either performed or assessed with clinical judgement   ADL                       Lower Body Dressing: Minimal assistance;Sit to/from stand;With adaptive equipment                 General ADL Comments: used wide sock aide and this was better. Walked around bed without RW--didn't want to use it and sat in chair, waiting for lunch.       Vision       Perception     Praxis      Cognition Arousal/Alertness: Awake/alert Behavior During Therapy: WFL for tasks assessed/performed Overall Cognitive Status: Within Functional Limits for tasks assessed                                          Exercises     Shoulder Instructions       General Comments      Pertinent Vitals/  Pain       Pain Score: 4  Pain Location: abdomen Pain Descriptors / Indicators: Sore Pain Intervention(s): Limited activity within patient's tolerance;Monitored during session;Premedicated before session;Repositioned  Home Living                                          Prior Functioning/Environment              Frequency           Progress Toward Goals  OT Goals(current goals can now be found in the care plan section)  Progress towards OT goals: Progressing toward goals(likely home tomorrow)     Plan      Co-evaluation                 AM-PAC PT "6 Clicks"  Daily Activity     Outcome Measure   Help from another person eating meals?: None Help from another person taking care of personal grooming?: A Little Help from another person toileting, which includes using toliet, bedpan, or urinal?: A Lot Help from another person bathing (including washing, rinsing, drying)?: A Little Help from another person to put on and taking off regular upper body clothing?: A Little Help from another person to put on and taking off regular lower body clothing?: A Little 6 Click Score: 18    End of Session    OT Visit Diagnosis: Unsteadiness on feet (R26.81);Muscle weakness (generalized) (M62.81);Pain   Activity Tolerance Patient tolerated treatment well   Patient Left in chair;with call bell/phone within reach;with family/visitor present   Nurse Communication          Time: 6222-9798 OT Time Calculation (min): 19 min  Charges: OT General Charges $OT Visit: 1 Visit OT Treatments $Self Care/Home Management : 8-22 mins  Lesle Chris, OTR/L [phone] 02/04/2018   Cathyann Kilfoyle 02/04/2018, 2:40 PM

## 2018-02-05 LAB — CBC
HCT: 38.9 % — ABNORMAL LOW (ref 39.0–52.0)
Hemoglobin: 13.1 g/dL (ref 13.0–17.0)
MCH: 30.7 pg (ref 26.0–34.0)
MCHC: 33.7 g/dL (ref 30.0–36.0)
MCV: 91.1 fL (ref 78.0–100.0)
PLATELETS: 243 10*3/uL (ref 150–400)
RBC: 4.27 MIL/uL (ref 4.22–5.81)
RDW: 13.9 % (ref 11.5–15.5)
WBC: 10 10*3/uL (ref 4.0–10.5)

## 2018-02-05 LAB — BASIC METABOLIC PANEL
ANION GAP: 10 (ref 5–15)
BUN: 13 mg/dL (ref 6–20)
CALCIUM: 8.9 mg/dL (ref 8.9–10.3)
CO2: 24 mmol/L (ref 22–32)
CREATININE: 1.15 mg/dL (ref 0.61–1.24)
Chloride: 104 mmol/L (ref 101–111)
Glucose, Bld: 134 mg/dL — ABNORMAL HIGH (ref 65–99)
Potassium: 3.7 mmol/L (ref 3.5–5.1)
SODIUM: 138 mmol/L (ref 135–145)

## 2018-02-05 LAB — MAGNESIUM: Magnesium: 1.9 mg/dL (ref 1.7–2.4)

## 2018-02-05 LAB — PHOSPHORUS: PHOSPHORUS: 3.7 mg/dL (ref 2.5–4.6)

## 2018-02-05 NOTE — Progress Notes (Signed)
Patient ID: Craig [name], male   DOB: 1961-09-13, 57 y.o.   MRN: [id number] 4 Days Post-Op   Subjective: Having an occasional "flareup" of mid abdominal pain.  Denies any currently.  Nausea.  Ileostomy output appropriate.  Objective: Vital signs in last 24 hours: Temp:  [98.7 F (37.1 C)-100.8 F (38.2 C)] 99.6 F (37.6 C) (03/24 0409) Pulse Rate:  [72-79] 77 (03/24 0409) Resp:  [16-18] 16 (03/24 0409) BP: (122-127)/(67-84) 127/75 (03/24 0409) SpO2:  [96 %] 96 % (03/24 0409) Weight:  [86.6 kg (190 lb 14.7 oz)] 86.6 kg (190 lb 14.7 oz) (03/24 0500) Last BM Date: 02/03/18  Intake/Output from previous day: 03/23 0701 - 03/24 0700 In: 1368.3 [P.O.:60; I.V.:1308.3] Out: 2025 [Urine:1525; Drains:100; Stool:400] Intake/Output this shift: No intake/output data recorded.  General appearance: alert, cooperative and no distress GI: Mild to moderate left lower quadrant tenderness.  Remainder of abdomen soft and nontender.  Stoma appears healthy with normal output Incision/Wound: No erythema or drainage  Lab Results:  Recent Labs    02/04/18 0443 02/05/18 0403  WBC 6.2 10.0  HGB 13.1 13.1  HCT 40.2 38.9*  PLT 220 243   BMET Recent Labs    02/04/18 0443 02/05/18 0403  NA 139 138  K 3.9 3.7  CL 107 104  CO2 24 24  GLUCOSE 108* 134*  BUN 11 13  CREATININE 1.27* 1.15  CALCIUM 8.9 8.9     Studies/Results: No results found.  Anti-infectives: Anti-infectives (From admission, onward)   Start     Dose/Rate Route Frequency Ordered Stop   02/01/18 1005  cefoTEtan in Dextrose 5% (CEFOTAN) IVPB 2 g     2 g Intravenous On call to O.R. 02/01/18 1005 02/01/18 1345      Assessment/Plan: s/p Procedure(s): LAPAROSCOPIC  LOW ANTERIOR RECTOSIGMOID RESECTION, COLOANAL ANASTOMOSIS;  DIVERTING LOOP ILESTOMY; FLEXIBLE SIGMOIDOSCOPY CYSTOSCOPY WITH RETROGRADE PYELOGRAM, URETEROSCOPY AND URETERAL CATHETERS BILATERAL Stable postoperatively.  Does have some slight increase in pain and  tenderness today and had low-grade fever overnight.  Does not appear ill.  WBC normal.  Home health not coming in until day after tomorrow.  Will observe today in the hospital and check CBC in a.m.   LOS: 4 days    Edward Jolly 02/05/2018

## 2018-02-05 NOTE — Progress Notes (Signed)
Pt has independently emptied, cleaned, and closed ileostomy pouch today. Also, emptied & recharged JP bulb as well.

## 2018-02-05 NOTE — Progress Notes (Signed)
Patient reports that he has started to leak stool from his rectum. JP drainage has currently changed to the same stool-like output that is in the ileostomy compared to previously in shift (sanguinous). The patient denies pain, and VSS. Notified Dr Excell Seltzer, he states there is nothing acutely to do at this time and that we continue to monitor the patient.

## 2018-02-06 ENCOUNTER — Inpatient Hospital Stay (HOSPITAL_COMMUNITY): Payer: Medicaid Other

## 2018-02-06 LAB — CBC
HEMATOCRIT: 42.7 % (ref 39.0–52.0)
HEMOGLOBIN: 14.4 g/dL (ref 13.0–17.0)
MCH: 31.1 pg (ref 26.0–34.0)
MCHC: 33.7 g/dL (ref 30.0–36.0)
MCV: 92.2 fL (ref 78.0–100.0)
Platelets: 260 10*3/uL (ref 150–400)
RBC: 4.63 MIL/uL (ref 4.22–5.81)
RDW: 13.9 % (ref 11.5–15.5)
WBC: 12.2 10*3/uL — ABNORMAL HIGH (ref 4.0–10.5)

## 2018-02-06 LAB — BASIC METABOLIC PANEL
ANION GAP: 13 (ref 5–15)
BUN: 14 mg/dL (ref 6–20)
CALCIUM: 9.1 mg/dL (ref 8.9–10.3)
CHLORIDE: 102 mmol/L (ref 101–111)
CO2: 23 mmol/L (ref 22–32)
Creatinine, Ser: 1.08 mg/dL (ref 0.61–1.24)
GFR calc non Af Amer: >60 mL/min (ref >60)
GLUCOSE: 122 mg/dL — AB (ref 65–99)
POTASSIUM: 3.4 mmol/L — AB (ref 3.5–5.1)
Sodium: 138 mmol/L (ref 135–145)

## 2018-02-06 LAB — PHOSPHORUS: Phosphorus: 2.9 mg/dL (ref 2.5–4.6)

## 2018-02-06 LAB — MAGNESIUM: Magnesium: 1.8 mg/dL (ref 1.7–2.4)

## 2018-02-06 MED ORDER — IOPAMIDOL (ISOVUE-300) INJECTION 61%
100.0000 mL | Freq: Once | INTRAVENOUS | Status: AC | PRN
  Administered 2018-02-06: 100 mL via INTRAVENOUS

## 2018-02-06 MED ORDER — IOPAMIDOL (ISOVUE-300) INJECTION 61%
INTRAVENOUS | Status: AC
  Filled 2018-02-06: qty 30

## 2018-02-06 MED ORDER — IOPAMIDOL (ISOVUE-300) INJECTION 61%
30.0000 mL | Freq: Once | INTRAVENOUS | Status: AC | PRN
  Administered 2018-02-06: 30 mL via ORAL

## 2018-02-06 MED ORDER — POTASSIUM CHLORIDE CRYS ER 20 MEQ PO TBCR
40.0000 meq | EXTENDED_RELEASE_TABLET | Freq: Once | ORAL | Status: AC
Start: 2018-02-06 — End: 2018-02-06
  Administered 2018-02-06: 40 meq via ORAL
  Filled 2018-02-06: qty 2

## 2018-02-06 MED ORDER — PSYLLIUM 95 % PO PACK
1.0000 | PACK | Freq: Two times a day (BID) | ORAL | Status: DC
  Administered 2018-02-06 – 2018-02-14 (×16): 1 via ORAL
  Filled 2018-02-06 (×16): qty 1

## 2018-02-06 MED ORDER — LOPERAMIDE HCL 2 MG PO CAPS
2.0000 mg | ORAL_CAPSULE | Freq: Three times a day (TID) | ORAL | Status: DC
  Administered 2018-02-06 – 2018-02-07 (×4): 2 mg via ORAL
  Filled 2018-02-06 (×4): qty 1

## 2018-02-06 MED ORDER — IOPAMIDOL (ISOVUE-300) INJECTION 61%
INTRAVENOUS | Status: AC
  Filled 2018-02-06: qty 100

## 2018-02-06 MED ORDER — MAGNESIUM SULFATE 2 GM/50ML IV SOLN
2.0000 g | Freq: Once | INTRAVENOUS | Status: AC
  Administered 2018-02-06: 2 g via INTRAVENOUS
  Filled 2018-02-06: qty 50

## 2018-02-06 MED ORDER — PIPERACILLIN-TAZOBACTAM 3.375 G IVPB
3.3750 g | Freq: Three times a day (TID) | INTRAVENOUS | Status: DC
  Administered 2018-02-06 – 2018-02-13 (×21): 3.375 g via INTRAVENOUS
  Filled 2018-02-06 (×20): qty 50

## 2018-02-06 NOTE — Progress Notes (Signed)
PT Cancellation Note  Patient Details Name: Craig [name] MRN: [id number] DOB: [birth year]   Cancelled Treatment:    Reason Eval/Treat Not Completed: Patient at procedure or test/unavailable(pt @ CT. Will follow. )   Philomena Doheny 02/06/2018, 10:06 AM [phone]

## 2018-02-06 NOTE — Consult Note (Addendum)
[facility] Nurse ostomy follow up Stoma type/location:  RUQ ileostomy Stomal assessment/size: 1 and 3/8 inches Red rubber catheter "rod" removed per surgeon's orders. Peristomal assessment: Intact skin surrounding.  Treatment options for stomal/peristomal skin: skin barrier ring to assist with maintaining a seal Output: mod amt green liquid stool Ostomy pouching: 1pc convex with skin barrier ring.  Education provided: Patient states he is emptying ostomy pouch with assistance and cleaning bottom 2-inches of tail closure independently   Supplies provided for discharge are at bedside (7 convex pouches and 7 skin barrier rings).   Enrolled patient in [place] Start Discharge program: Yes Assisted patient with pouch change and removed rod. Reviewed pouching routines and ordering supplies.  Pt denies further questions.  Recommend home health assistance after discharge for further assistance. Julien Girt MSN, RN, [place], [place], [place]

## 2018-02-06 NOTE — Progress Notes (Signed)
Patient ID: Craig [name], male   DOB: [birth year], 57 y.o.   MRN: [id number] 5 Days Post-Op   Subjective: Some nausea but toleraing diet without emesis. Ambulating. Ileostomy output increased substantially in last 24hrs - 2.6L. Drain output became darker and slightly more turbid - 225cc/24hrs. Had a small BM per anus yesterday as well  Objective: Vital signs in last 24 hours: Temp:  [98.9 F (37.2 C)-99.6 F (37.6 C)] 99.6 F (37.6 C) (03/25 0611) Pulse Rate:  [71-84] 84 (03/25 0611) Resp:  [17-18] 17 (03/25 0611) BP: (117-132)/(78-86) 122/78 (03/25 0611) SpO2:  [95 %-100 %] 95 % (03/25 0611) Weight:  [83.9 kg (184 lb 15.5 oz)] 83.9 kg (184 lb 15.5 oz) (03/25 0500) Last BM Date: 02/05/18  Intake/Output from previous day: 03/24 0701 - 03/25 0700 In: 2001.7 [P.O.:760; I.V.:1241.7] Out: 3500 [Urine:700; Drains:225; KKXFG:1829] Intake/Output this shift: No intake/output data recorded.  General appearance: alert, cooperative and no distress GI: Mild left lower quadrant tenderness. Nondistended. Remainder of abdomen soft and nontender.  Stoma appears healthy with thin green output Incision/Wound: No erythema or drainage  Lab Results:  Recent Labs    02/05/18 0403 02/06/18 0415  WBC 10.0 12.2*  HGB 13.1 14.4  HCT 38.9* 42.7  PLT 243 260   BMET Recent Labs    02/05/18 0403 02/06/18 0415  NA 138 138  K 3.7 3.4*  CL 104 102  CO2 24 23  GLUCOSE 134* 122*  BUN 13 14  CREATININE 1.15 1.08  CALCIUM 8.9 9.1     Studies/Results: No results found.  Anti-infectives: Anti-infectives (From admission, onward)   Start     Dose/Rate Route Frequency Ordered Stop   02/06/18 0815  piperacillin-tazobactam (ZOSYN) IVPB 3.375 g     3.375 g 100 mL/hr over 30 Minutes Intravenous Every 6 hours 02/06/18 0805     02/01/18 1005  cefoTEtan in Dextrose 5% (CEFOTAN) IVPB 2 g     2 g Intravenous On call to O.R. 02/01/18 1005 02/01/18 1345      Assessment/Plan: s/p  Procedure(s): LAPAROSCOPIC  LOW ANTERIOR RECTOSIGMOID RESECTION, COLOANAL ANASTOMOSIS;  DIVERTING LOOP ILESTOMY; FLEXIBLE SIGMOIDOSCOPY CYSTOSCOPY WITH RETROGRADE PYELOGRAM, URETEROSCOPY AND URETERAL CATHETERS BILATERAL POD#5  -Does have some darker drain output and WBC 12. Afebrile, normal vitals, reassuring abdominal exam. Will obtain CT A/P today to further evaluate; will also start Zosyn -Add metamucil and will also start imodium 2mg  TID today to slow down the ileostomy; increase MIVF to 100cc/hr until output slows -Ambulate 5x/day -PPx: SQH, SCDs -Dispo: Home health to be in place starting tomorrow   LOS: 5 days   Sharon Mt. Dema Severin, M.D. General and Colorectal Surgery [facility]

## 2018-02-07 LAB — BASIC METABOLIC PANEL
Anion gap: 8 (ref 5–15)
BUN: 15 mg/dL (ref 6–20)
CO2: 26 mmol/L (ref 22–32)
Calcium: 8.8 mg/dL — ABNORMAL LOW (ref 8.9–10.3)
Chloride: 103 mmol/L (ref 101–111)
Creatinine, Ser: 1.21 mg/dL (ref 0.61–1.24)
GFR calc Af Amer: >60 mL/min (ref >60)
GLUCOSE: 112 mg/dL — AB (ref 65–99)
POTASSIUM: 4.2 mmol/L (ref 3.5–5.1)
SODIUM: 137 mmol/L (ref 135–145)

## 2018-02-07 LAB — CBC WITH DIFFERENTIAL/PLATELET
BASOS ABS: 0 10*3/uL (ref 0.0–0.1)
Basophils Relative: 0 %
EOS ABS: 0.3 10*3/uL (ref 0.0–0.7)
EOS PCT: 3 %
HCT: 39.2 % (ref 39.0–52.0)
Hemoglobin: 13.3 g/dL (ref 13.0–17.0)
LYMPHS PCT: 3 %
Lymphs Abs: 0.3 10*3/uL — ABNORMAL LOW (ref 0.7–4.0)
MCH: 30.6 pg (ref 26.0–34.0)
MCHC: 33.9 g/dL (ref 30.0–36.0)
MCV: 90.1 fL (ref 78.0–100.0)
MONO ABS: 1.1 10*3/uL — AB (ref 0.1–1.0)
Monocytes Relative: 10 %
Neutro Abs: 9.8 10*3/uL — ABNORMAL HIGH (ref 1.7–7.7)
Neutrophils Relative %: 84 %
PLATELETS: 250 10*3/uL (ref 150–400)
RBC: 4.35 MIL/uL (ref 4.22–5.81)
RDW: 13.5 % (ref 11.5–15.5)
WBC: 11.6 10*3/uL — AB (ref 4.0–10.5)

## 2018-02-07 LAB — PHOSPHORUS: Phosphorus: 3.4 mg/dL (ref 2.5–4.6)

## 2018-02-07 LAB — MAGNESIUM: MAGNESIUM: 2.4 mg/dL (ref 1.7–2.4)

## 2018-02-07 MED ORDER — LACTATED RINGERS IV BOLUS
500.0000 mL | Freq: Once | INTRAVENOUS | Status: AC
  Administered 2018-02-07: 500 mL via INTRAVENOUS

## 2018-02-07 MED ORDER — LOPERAMIDE HCL 2 MG PO CAPS
2.0000 mg | ORAL_CAPSULE | Freq: Three times a day (TID) | ORAL | Status: DC
  Administered 2018-02-07 (×3): 2 mg via ORAL
  Filled 2018-02-07 (×3): qty 1

## 2018-02-07 NOTE — Progress Notes (Signed)
Physical Therapy Treatment/Discharge Note Patient Details Name: Craig [name] MRN: [id number] DOB: 1961-06-03 Today's Date: 02/07/2018    History of Present Illness 57 yo male s/p LAR, diverting loop ileostomy 02/01/18.     PT Comments    Pt reports he has been mobilizing mod Ind for last few days. Had pt walk without IV pole support to assess stability-no LOB or difficulty noted. No further acute PT needs at this time. Will sign off and d/c pt from PT caseload. Please reorder if mobility needs change.     Follow Up Recommendations  No PT follow up     Equipment Recommendations  None recommended by PT    Recommendations for Other Services       Precautions / Restrictions Precautions Precaution Comments: L side drain; abdominal surgery Restrictions Weight Bearing Restrictions: No    Mobility  Bed Mobility               General bed mobility comments: oob  Transfers                 General transfer comment: oob  Ambulation/Gait   Ambulation Distance (Feet): 400 Feet(250 with IV pole, 150 without device) Assistive device: None(IV pole) Gait Pattern/deviations: Step-through pattern     General Gait Details: slow but steady gait. Observed pt walking mod ind with use of iv pole. Also had him walk without external support-no LOB or physical assist required.    Stairs            Wheelchair Mobility    Modified Rankin (Stroke Patients Only)       Balance Overall balance assessment: No apparent balance deficits (not formally assessed)             Standing balance comment: observed pt negotiate narrow space between bed and nightstand, bend over and plug IV pole back into wall.                             Cognition Arousal/Alertness: Awake/alert Behavior During Therapy: WFL for tasks assessed/performed Overall Cognitive Status: Within Functional Limits for tasks assessed                                         Exercises      General Comments        Pertinent Vitals/Pain Pain Assessment: 0-10 Pain Score: 4  Pain Location: abdomen Pain Descriptors / Indicators: Discomfort;Sore Pain Intervention(s): Monitored during session    Home Living                      Prior Function            PT Goals (current goals can now be found in the care plan section) Progress towards PT goals: Goals met/education completed, patient discharged from PT    Frequency           PT Plan Discharge plan needs to be updated    Co-evaluation              AM-PAC PT "6 Clicks" Daily Activity  Outcome Measure  Difficulty turning over in bed (including adjusting bedclothes, sheets and blankets)?: A Little Difficulty moving from lying on back to sitting on the side of the bed? : A Little Difficulty sitting down on and standing up from a chair with arms (  e.g., wheelchair, bedside commode, etc,.)?: A Little Help needed moving to and from a bed to chair (including a wheelchair)?: A Little Help needed walking in hospital room?: A Little Help needed climbing 3-5 steps with a railing? : A Little 6 Click Score: 18    End of Session   Activity Tolerance: Patient tolerated treatment well Patient left: (in room about to get into bed)         Time: 3668-1594 PT Time Calculation (min) (ACUTE ONLY): 8 min  Charges:  $Gait Training: 8-22 mins                    G Codes:          Weston Anna, MPT Pager: [phone]

## 2018-02-07 NOTE — Progress Notes (Signed)
Patient ID: Craig [name], male   DOB: [birth year], 57 y.o.   MRN: [id number] 6 Days Post-Op   Subjective: Pain much improved. Nausea much improved. Ambulating. Drain output low (now ~30cc from 225cc) but remains somewhat dark in color.  Objective: Vital signs in last 24 hours: Temp:  [98.2 F (36.8 C)-101.3 F (38.5 C)] 98.2 F (36.8 C) (03/26 0602) Pulse Rate:  [67-90] 67 (03/26 0602) Resp:  [18] 18 (03/26 0602) BP: (101-126)/(72-80) 120/80 (03/26 0602) SpO2:  [97 %-100 %] 100 % (03/26 0602) Weight:  [82 kg (180 lb 12.4 oz)] 82 kg (180 lb 12.4 oz) (03/26 0602) Last BM Date: 02/05/18  Intake/Output from previous day: 03/25 0701 - 03/26 0700 In: 2318.8 [P.O.:600; I.V.:1618.8; IV Piggyback:100] Out: 7062 [Urine:1750; Drains:15; BJSEG:3151] Intake/Output this shift: No intake/output data recorded.  General appearance: alert, cooperative and no distress GI: Abdomen is soft, nontender, nondistended. Stoma appears healthy with thin green output Incision/Wound: No erythema or drainage  Lab Results:  Recent Labs    02/06/18 0415 02/07/18 0531  WBC 12.2* 11.6*  HGB 14.4 13.3  HCT 42.7 39.2  PLT 260 250   BMET Recent Labs    02/06/18 0415 02/07/18 0531  NA 138 137  K 3.4* 4.2  CL 102 103  CO2 23 26  GLUCOSE 122* 112*  BUN 14 15  CREATININE 1.08 1.21  CALCIUM 9.1 8.8*     Studies/Results: Ct Abdomen Pelvis W Contrast  Result Date: 02/06/2018 CLINICAL DATA:  Mid abdominal pain and low-grade fever. Status post low anterior rectal sigmoid resection. Evaluate for abscess. EXAM: CT ABDOMEN AND PELVIS WITH CONTRAST TECHNIQUE: Multidetector CT imaging of the abdomen and pelvis was performed using the standard protocol following bolus administration of intravenous contrast. CONTRAST:  138mL ISOVUE-300 IOPAMIDOL (ISOVUE-300) INJECTION 61% COMPARISON:  09/15/2017 FINDINGS: Lower chest: Subsegmental atelectasis is identified within both lung bases. Hepatobiliary: No focal liver  abnormality. The gallbladder appears normal. No biliary dilatation. Pancreas: Unremarkable. No pancreatic ductal dilatation or surrounding inflammatory changes. Spleen: Normal in size without focal abnormality. Adrenals/Urinary Tract: The adrenal glands appear normal. Stone within the inferior pole of the right kidney measures 3 mm, image 38/2. Several left renal calculi are identified within the inferior pole of the left kidney which measure up to 3 mm. Right pelvocaliectasis and mild hydroureter identified. No left-sided hydronephrosis or hydroureter. Urinary bladder appears normal. Stomach/Bowel: Stomach is normal. The mid and distal small bowel loops appeared increased in caliber measuring up to 3.2 cm, image 55/2. There is abnormal wall thickening involving the distal small bowel loops within the right lower quadrant of the abdomen. Single wall thickness measures up to 7 mm. No pneumatosis. There are inflammatory changes and small interloop fluid collections identified in the right lower quadrant of the abdomen and pelvis.No evidence for small bowel obstruction, enteric contrast material is identified up to the level of the distal small bowel and entering the right lower quadrant ileostomy. No pathologic dilatation of the colon. Colo-anal anastomosis is identified with suture line, image 86/2. Vascular/Lymphatic: Aortic atherosclerosis. No aneurysm. No upper abdominal adenopathy. No pelvic or inguinal adenopathy. Reproductive: Prostate is unremarkable. Other: Pneumoperitoneum is identified compatible with recent surgery. There is gas identified within the lower ventral abdominal and pelvic wall. Low attenuation edema within the lower portion of the rectus musculature is identified. Right lower quadrant fluid collection is identified measuring 1.8 by 3.1 by 3.2 cm, image 64/2. Within the right side of pelvis there is a second fluid collection measuring  2.1 by 1.8 by 2.4 cm, image 70/2. There is a moderate amount  of partially loculated fluid identified within the presacral soft tissue space measuring 5.5 by 2.2 by 8.5 cm, image 79/2 and 64/6. A surgical drain which enters the abdomen from the left terminates within the presacral soft tissue fluid. Musculoskeletal: No acute or significant osseous findings. IMPRESSION: 1. Small loculated fluid collections are identified within the right lower quadrant of the abdomen and right side of pelvis. Although non-specific, abscesses cannot be excluded. 2. A moderate amount of fluid within the presacral soft tissue space is noted which may be partially loculated. Left-sided surgical drainage catheter terminates within this fluid. 3. Right lower quadrant inflammatory changes are identified with secondary wall thickening and increase caliber of the small bowel loops compatible with enteritis. No evidence for small bowel obstruction. 4. Nonobstructing bilateral renal calculi. Electronically Signed   By: Kerby Moors M.D.   On: 02/06/2018 11:22    Anti-infectives: Anti-infectives (From admission, onward)   Start     Dose/Rate Route Frequency Ordered Stop   02/06/18 0900  piperacillin-tazobactam (ZOSYN) IVPB 3.375 g     3.375 g 12.5 mL/hr over 240 Minutes Intravenous Every 8 hours 02/06/18 0805     02/01/18 1005  cefoTEtan in Dextrose 5% (CEFOTAN) IVPB 2 g     2 g Intravenous On call to O.R. 02/01/18 1005 02/01/18 1345      Assessment/Plan: s/p Procedure(s): LAPAROSCOPIC  LOW ANTERIOR RECTOSIGMOID RESECTION, COLOANAL ANASTOMOSIS;  DIVERTING LOOP ILESTOMY; FLEXIBLE SIGMOIDOSCOPY CYSTOSCOPY WITH RETROGRADE PYELOGRAM, URETEROSCOPY AND URETERAL CATHETERS BILATERAL POD#6  -CT showed small loculated collections in RLQ and moderate amount of fluid in presacral space, partially loculated but drain tip appears to terminate right within the collection -WBC down-trending today; tmax yesterday of 38.5. Continue IV Zosyn -High ileostomy output - likely in part due to fluid  collections seen in RLQ with some associated enteritis, so this should improve with time. Continue metamucil, increase imodium to 2mg  TID with meals + at bedtime. Will bolus 500cc this morning; MIVF at 125cc/hr. Monitor -Ambulate 5x/day -PPx: SQH, SCDs -Dispo: Once above resolves, will plan home with home health   LOS: 6 days   Sharon Mt. Dema Severin, M.D. General and Colorectal Surgery [facility]

## 2018-02-08 LAB — BASIC METABOLIC PANEL
Anion gap: 10 (ref 5–15)
BUN: 11 mg/dL (ref 6–20)
CALCIUM: 8.4 mg/dL — AB (ref 8.9–10.3)
CO2: 21 mmol/L — ABNORMAL LOW (ref 22–32)
Chloride: 104 mmol/L (ref 101–111)
Creatinine, Ser: 1.12 mg/dL (ref 0.61–1.24)
GFR calc Af Amer: >60 mL/min (ref >60)
GLUCOSE: 117 mg/dL — AB (ref 65–99)
POTASSIUM: 4 mmol/L (ref 3.5–5.1)
SODIUM: 135 mmol/L (ref 135–145)

## 2018-02-08 LAB — CBC WITH DIFFERENTIAL/PLATELET
BASOS PCT: 0 %
Basophils Absolute: 0 10*3/uL (ref 0.0–0.1)
EOS ABS: 0.1 10*3/uL (ref 0.0–0.7)
EOS PCT: 1 %
HCT: 35.3 % — ABNORMAL LOW (ref 39.0–52.0)
Hemoglobin: 11.6 g/dL — ABNORMAL LOW (ref 13.0–17.0)
LYMPHS ABS: 0.7 10*3/uL (ref 0.7–4.0)
Lymphocytes Relative: 6 %
MCH: 30.1 pg (ref 26.0–34.0)
MCHC: 32.9 g/dL (ref 30.0–36.0)
MCV: 91.5 fL (ref 78.0–100.0)
MONO ABS: 0.8 10*3/uL (ref 0.1–1.0)
Monocytes Relative: 7 %
NEUTROS PCT: 86 %
Neutro Abs: 9.7 10*3/uL — ABNORMAL HIGH (ref 1.7–7.7)
PLATELETS: 291 10*3/uL (ref 150–400)
RBC: 3.86 MIL/uL — ABNORMAL LOW (ref 4.22–5.81)
RDW: 13.8 % (ref 11.5–15.5)
WBC: 11.3 10*3/uL — ABNORMAL HIGH (ref 4.0–10.5)

## 2018-02-08 LAB — MAGNESIUM: MAGNESIUM: 2.1 mg/dL (ref 1.7–2.4)

## 2018-02-08 LAB — PHOSPHORUS: Phosphorus: 2.6 mg/dL (ref 2.5–4.6)

## 2018-02-08 MED ORDER — LOPERAMIDE HCL 2 MG PO CAPS
4.0000 mg | ORAL_CAPSULE | Freq: Three times a day (TID) | ORAL | Status: DC
  Administered 2018-02-08 – 2018-02-14 (×18): 4 mg via ORAL
  Filled 2018-02-08 (×18): qty 2

## 2018-02-08 MED ORDER — LOPERAMIDE HCL 2 MG PO CAPS: 4.0000 mg | ORAL_CAPSULE | Freq: Three times a day (TID) | ORAL | Status: DC

## 2018-02-08 NOTE — Progress Notes (Signed)
Patient ID: Craig [name], male   DOB: 1961/07/18, 57 y.o.   MRN: [id number] 7 Days Post-Op   Subjective: Denies abdominal pain or nausea. Ambulating. Drain output low, dark in color.  Objective: Vital signs in last 24 hours: Temp:  [98.2 F (36.8 C)-101.2 F (38.4 C)] 99.4 F (37.4 C) (03/27 0525) Pulse Rate:  [80-86] 80 (03/27 0525) Resp:  [18-20] 20 (03/27 0525) BP: (113-128)/(67-74) 113/74 (03/27 0525) SpO2:  [98 %-100 %] 98 % (03/27 0525) Weight:  [80.9 kg (178 lb 5.6 oz)] 80.9 kg (178 lb 5.6 oz) (03/27 0525) Last BM Date: 02/07/18(ostomy)  Intake/Output from previous day: 03/26 0701 - 03/27 0700 In: 4780 [P.O.:1130; I.V.:3500; IV Piggyback:150] Out: 2595 [Urine:2550; Drains:20; GLOVF:6433] Intake/Output this shift: No intake/output data recorded.  General appearance: alert, cooperative and no distress GI: Abdomen is soft, nontender, nondistended. Stoma pink with thin green output Incision/Wound: No erythema or drainage  Lab Results:  Recent Labs    02/07/18 0531 02/08/18 0442  WBC 11.6* 11.3*  HGB 13.3 11.6*  HCT 39.2 35.3*  PLT 250 291   BMET Recent Labs    02/07/18 0531 02/08/18 0442  NA 137 135  K 4.2 4.0  CL 103 104  CO2 26 21*  GLUCOSE 112* 117*  BUN 15 11  CREATININE 1.21 1.12  CALCIUM 8.8* 8.4*     Studies/Results: Ct Abdomen Pelvis W Contrast  Result Date: 02/06/2018 CLINICAL DATA:  Mid abdominal pain and low-grade fever. Status post low anterior rectal sigmoid resection. Evaluate for abscess. EXAM: CT ABDOMEN AND PELVIS WITH CONTRAST TECHNIQUE: Multidetector CT imaging of the abdomen and pelvis was performed using the standard protocol following bolus administration of intravenous contrast. CONTRAST:  167mL ISOVUE-300 IOPAMIDOL (ISOVUE-300) INJECTION 61% COMPARISON:  09/15/2017 FINDINGS: Lower chest: Subsegmental atelectasis is identified within both lung bases. Hepatobiliary: No focal liver abnormality. The gallbladder appears normal. No biliary  dilatation. Pancreas: Unremarkable. No pancreatic ductal dilatation or surrounding inflammatory changes. Spleen: Normal in size without focal abnormality. Adrenals/Urinary Tract: The adrenal glands appear normal. Stone within the inferior pole of the right kidney measures 3 mm, image 38/2. Several left renal calculi are identified within the inferior pole of the left kidney which measure up to 3 mm. Right pelvocaliectasis and mild hydroureter identified. No left-sided hydronephrosis or hydroureter. Urinary bladder appears normal. Stomach/Bowel: Stomach is normal. The mid and distal small bowel loops appeared increased in caliber measuring up to 3.2 cm, image 55/2. There is abnormal wall thickening involving the distal small bowel loops within the right lower quadrant of the abdomen. Single wall thickness measures up to 7 mm. No pneumatosis. There are inflammatory changes and small interloop fluid collections identified in the right lower quadrant of the abdomen and pelvis.No evidence for small bowel obstruction, enteric contrast material is identified up to the level of the distal small bowel and entering the right lower quadrant ileostomy. No pathologic dilatation of the colon. Colo-anal anastomosis is identified with suture line, image 86/2. Vascular/Lymphatic: Aortic atherosclerosis. No aneurysm. No upper abdominal adenopathy. No pelvic or inguinal adenopathy. Reproductive: Prostate is unremarkable. Other: Pneumoperitoneum is identified compatible with recent surgery. There is gas identified within the lower ventral abdominal and pelvic wall. Low attenuation edema within the lower portion of the rectus musculature is identified. Right lower quadrant fluid collection is identified measuring 1.8 by 3.1 by 3.2 cm, image 64/2. Within the right side of pelvis there is a second fluid collection measuring 2.1 by 1.8 by 2.4 cm, image 70/2. There  is a moderate amount of partially loculated fluid identified within the  presacral soft tissue space measuring 5.5 by 2.2 by 8.5 cm, image 79/2 and 64/6. A surgical drain which enters the abdomen from the left terminates within the presacral soft tissue fluid. Musculoskeletal: No acute or significant osseous findings. IMPRESSION: 1. Small loculated fluid collections are identified within the right lower quadrant of the abdomen and right side of pelvis. Although non-specific, abscesses cannot be excluded. 2. A moderate amount of fluid within the presacral soft tissue space is noted which may be partially loculated. Left-sided surgical drainage catheter terminates within this fluid. 3. Right lower quadrant inflammatory changes are identified with secondary wall thickening and increase caliber of the small bowel loops compatible with enteritis. No evidence for small bowel obstruction. 4. Nonobstructing bilateral renal calculi. Electronically Signed   By: Kerby Moors M.D.   On: 02/06/2018 11:22    Anti-infectives: Anti-infectives (From admission, onward)   Start     Dose/Rate Route Frequency Ordered Stop   02/06/18 0900  piperacillin-tazobactam (ZOSYN) IVPB 3.375 g     3.375 g 12.5 mL/hr over 240 Minutes Intravenous Every 8 hours 02/06/18 0805     02/01/18 1005  cefoTEtan in Dextrose 5% (CEFOTAN) IVPB 2 g     2 g Intravenous On call to O.R. 02/01/18 1005 02/01/18 1345      Assessment/Plan: s/p Procedure(s): LAPAROSCOPIC  LOW ANTERIOR RECTOSIGMOID RESECTION, COLOANAL ANASTOMOSIS;  DIVERTING LOOP ILESTOMY; FLEXIBLE SIGMOIDOSCOPY CYSTOSCOPY WITH RETROGRADE PYELOGRAM, URETEROSCOPY AND URETERAL CATHETERS BILATERAL POD#7  -CT showed small loculated collections in RLQ and moderate amount of fluid in presacral space, partially loculated but drain tip appears to terminate right within the collection -WBC down-trending today; tmax yesterday of 38.5. Continue IV Zosyn -High ileostomy output - likely in part due to fluid collections seen in RLQ with some associated enteritis, so  this should improve with time. Continue metamucil, increase imodium to 4mg  TID; MIVF at 125cc/hr. -Ambulate 5x/day -PPx: SQH, SCDs -Dispo: Once above resolves, will plan home with home health   LOS: 7 days   Sharon Mt. Dema Severin, M.D. General and Colorectal Surgery [facility]

## 2018-02-09 ENCOUNTER — Inpatient Hospital Stay (HOSPITAL_COMMUNITY): Payer: Medicaid Other

## 2018-02-09 LAB — BASIC METABOLIC PANEL
Anion gap: 9 (ref 5–15)
BUN: 10 mg/dL (ref 6–20)
CALCIUM: 8.6 mg/dL — AB (ref 8.9–10.3)
CO2: 22 mmol/L (ref 22–32)
CREATININE: 1 mg/dL (ref 0.61–1.24)
Chloride: 106 mmol/L (ref 101–111)
GFR calc non Af Amer: >60 mL/min (ref >60)
Glucose, Bld: 116 mg/dL — ABNORMAL HIGH (ref 65–99)
Potassium: 4 mmol/L (ref 3.5–5.1)
Sodium: 137 mmol/L (ref 135–145)

## 2018-02-09 LAB — CBC WITH DIFFERENTIAL/PLATELET
Basophils Absolute: 0 10*3/uL (ref 0.0–0.1)
Basophils Relative: 0 %
Eosinophils Absolute: 0.2 10*3/uL (ref 0.0–0.7)
Eosinophils Relative: 2 %
HCT: 33.4 % — ABNORMAL LOW (ref 39.0–52.0)
Hemoglobin: 11.1 g/dL — ABNORMAL LOW (ref 13.0–17.0)
Lymphocytes Relative: 7 %
Lymphs Abs: 0.8 10*3/uL (ref 0.7–4.0)
MCH: 30.4 pg (ref 26.0–34.0)
MCHC: 33.2 g/dL (ref 30.0–36.0)
MCV: 91.5 fL (ref 78.0–100.0)
Monocytes Absolute: 0.7 10*3/uL (ref 0.1–1.0)
Monocytes Relative: 6 %
Neutro Abs: 10.1 10*3/uL — ABNORMAL HIGH (ref 1.7–7.7)
Neutrophils Relative %: 85 %
Platelets: 313 10*3/uL (ref 150–400)
RBC: 3.65 MIL/uL — ABNORMAL LOW (ref 4.22–5.81)
RDW: 13.7 % (ref 11.5–15.5)
WBC: 11.8 10*3/uL — ABNORMAL HIGH (ref 4.0–10.5)

## 2018-02-09 LAB — PHOSPHORUS: Phosphorus: 2.9 mg/dL (ref 2.5–4.6)

## 2018-02-09 LAB — MAGNESIUM: Magnesium: 2.1 mg/dL (ref 1.7–2.4)

## 2018-02-09 MED ORDER — IOPAMIDOL (ISOVUE-300) INJECTION 61%
100.0000 mL | Freq: Once | INTRAVENOUS | Status: AC | PRN
  Administered 2018-02-09: 100 mL via INTRAVENOUS

## 2018-02-09 MED ORDER — IOPAMIDOL (ISOVUE-300) INJECTION 61%
INTRAVENOUS | Status: AC
  Filled 2018-02-09: qty 100

## 2018-02-09 NOTE — Consult Note (Addendum)
[place] Nurse ostomy follow up Stoma type/location:RUQ ileostomy Stomal assessment/size:1 and 3/8 inches Peristomal assessment:Intact skin surrounding.  Treatment options for stomal/peristomal skin:skin barrier ring to assist with maintaining a seal Output: mod amt brown liquid stool Ostomy pouching: 1pcconvex with skin barrier ring.  Education provided:Patient assisted with pouch application and states he is emptying ostomy pouch and cleaning bottom 2-inches of tail closure independently. Supplies provided for discharge are at bedside (7 convex pouches and 7 skin barrier rings).  Enrolled patient in [place] Start Discharge program: Yes Reviewed pouching routines and ordering supplies.  Pt denies further questions.  Recommend home health assistance after discharge for further assistance. Julien Girt MSN, RN, [place], [place], [place]

## 2018-02-09 NOTE — Progress Notes (Signed)
Patient ID: Craig [name], male   DOB: 07/11/1961, 57 y.o.   MRN: [id number] 8 Days Post-Op   Subjective: Denies abdominal pain or nausea. Ambulating. Drain output low, dark and turbid  in color. Tolerating diet.  Objective: Vital signs in last 24 hours: Temp:  [98.7 F (37.1 C)-99.8 F (37.7 C)] 98.7 F (37.1 C) (03/28 0547) Pulse Rate:  [75-85] 85 (03/28 0547) Resp:  [18-20] 18 (03/28 0547) BP: (107-135)/(58-75) 107/58 (03/28 0547) SpO2:  [96 %-99 %] 96 % (03/28 0547) Weight:  [81.4 kg (179 lb 8 oz)] 81.4 kg (179 lb 8 oz) (03/28 0547) Last BM Date: 02/08/18  Intake/Output from previous day: 03/27 0701 - 03/28 0700 In: 4170 [P.O.:1020; I.V.:3000; IV Piggyback:150] Out: 3880 [Urine:3550; Drains:30; JJHER:740] Intake/Output this shift: Total I/O In: 1978.3 [P.O.:420; I.V.:1508.3; IV Piggyback:50] Out: 2080 [Urine:1750; Drains:30; Stool:300]  General appearance: alert, cooperative and no distress GI: Abdomen is soft, nontender, nondistended. Stoma pink with thickening green output Incision/Wound: No erythema or drainage  Lab Results:  Recent Labs    02/08/18 0442 02/09/18 0428  WBC 11.3* 11.8*  HGB 11.6* 11.1*  HCT 35.3* 33.4*  PLT 291 313   BMET Recent Labs    02/08/18 0442 02/09/18 0428  NA 135 137  K 4.0 4.0  CL 104 106  CO2 21* 22  GLUCOSE 117* 116*  BUN 11 10  CREATININE 1.12 1.00  CALCIUM 8.4* 8.6*     Studies/Results: No results found.  Anti-infectives: Anti-infectives (From admission, onward)   Start     Dose/Rate Route Frequency Ordered Stop   02/06/18 0900  piperacillin-tazobactam (ZOSYN) IVPB 3.375 g     3.375 g 12.5 mL/hr over 240 Minutes Intravenous Every 8 hours 02/06/18 0805     02/01/18 1005  cefoTEtan in Dextrose 5% (CEFOTAN) IVPB 2 g     2 g Intravenous On call to O.R. 02/01/18 1005 02/01/18 1345      Assessment/Plan: s/p Procedure(s): LAPAROSCOPIC  LOW ANTERIOR RECTOSIGMOID RESECTION, COLOANAL ANASTOMOSIS;  DIVERTING LOOP  ILESTOMY; FLEXIBLE SIGMOIDOSCOPY CYSTOSCOPY WITH RETROGRADE PYELOGRAM, URETEROSCOPY AND URETERAL CATHETERS BILATERAL POD#8  -Repeat CT today to assess collections; WBC slightly elevated; IV abx now for 4d; afebrile last 24hrs. Will assess for potential drain placement -Continue IV Zosyn -High ileostomy output - Improved with metamucil and imodium to 4mg  TID; decrease MIVF to 50cc/hr -Ambulate 5x/day -PPx: SQH, SCDs -Dispo: Once above resolves, will plan home with home health   LOS: 8 days   Sharon Mt. Dema Severin, M.D. General and Colorectal Surgery [facility]

## 2018-02-09 NOTE — Consult Note (Signed)
Chief Complaint: Patient was seen in consultation today for CT guided drainage of presacral fluid collection  Referring Physician(s): White,C  Supervising Physician: Marybelle Killings  Patient Status: [facility] - In-pt  History of Present Illness: Craig [name] is a 57 y.o. male with history of rectal cancer, status post ultra low anterior resection, coloanal anastomosis, takedown of splenic flexure, diverting loop ileostomy and flex sigmoidoscopy on 02/01/18.  He has a surgically placed left abdominal drain and now presents with weakness, fever, mild leukocytosis and imaging findings of rim-enhancing fluid collections in the right lower quadrant as well as loculated but not encapsulated presacral and incisional fluid with mild presacral fluid increase.  Request now received from surgery for drainage of the presacral fluid collection.  Past Medical History:  Diagnosis Date  . Anxiety    Truck driver  . Dysrhythmia    palpitations  . Elevated serum creatinine    1.39 in 06/2012  . Family history of cancer   . History of kidney stones   . Palpitations    + chest pressure; PVCs  . Rectal bleeding 01/07/2017  . Rectal cancer ([place]) 09/30/2017    Past Surgical History:  Procedure Laterality Date  . COLONOSCOPY N/A 09/08/2017   Procedure: COLONOSCOPY;  Surgeon: Rogene Houston, MD;  Location: [facility];  Service: Endoscopy;  Laterality: N/A;  . CYSTOSCOPY WITH RETROGRADE PYELOGRAM, URETEROSCOPY AND STENT PLACEMENT Bilateral 02/01/2018   Procedure: CYSTOSCOPY WITH RETROGRADE PYELOGRAM, URETEROSCOPY AND URETERAL CATHETERS BILATERAL;  Surgeon: Alexis Frock, MD;  Location: [facility];  Service: Urology;  Laterality: Bilateral;  . LAPAROSCOPIC LOW ANTERIOR RESECTION N/A 02/01/2018   Procedure: LAPAROSCOPIC  LOW ANTERIOR RECTOSIGMOID RESECTION, COLOANAL ANASTOMOSIS;  DIVERTING LOOP ILESTOMY; FLEXIBLE SIGMOIDOSCOPY;  Surgeon: Ileana Roup, MD;  Location: [facility];  Service: General;   Laterality: N/A;    Allergies: Patient has no known allergies.  Medications: Prior to Admission medications   Medication Sig Start Date End Date Taking? Authorizing Provider  Acetaminophen 500 MG coapsule Take 1,000 mg by mouth every 6 (six) hours as needed for moderate pain or headache.    Yes [provider]  diphenhydrAMINE (SOMINEX) 25 MG tablet Take 75 mg by mouth at bedtime as needed for sleep.   Yes [provider]  docusate sodium (COLACE) 100 MG capsule Take 2 capsules (200 mg total) by mouth daily. 11/02/17  Yes Ladell Pier, MD  naphazoline-glycerin (CLEAR EYES REDNESS) 0.012-0.2 % SOLN 1-2 drops 4 (four) times daily as needed for eye irritation.   Yes [provider]  oxymetazoline (AFRIN) 0.05 % nasal spray Place 1 spray into both nostrils daily as needed for congestion.   Yes [provider]  oxyCODONE (OXY IR/ROXICODONE) 5 MG immediate release tablet Take 1 tablet (5 mg total) by mouth every 4 (four) hours as needed for severe pain (may take 1-2 tablets every 4 hours prn pain). Patient not taking: Reported on 01/30/2018 11/23/17   Gery Pray, MD  polyethylene glycol [facility] / Floria Raveling) packet Take 17 g by mouth daily. Patient not taking: Reported on 01/04/2018 11/02/17   Ladell Pier, MD  prochlorperazine (COMPAZINE) 10 MG tablet Take 1 tablet (10 mg total) by mouth every 6 (six) hours as needed for nausea or vomiting. Patient not taking: Reported on 01/04/2018 10/21/17   Kyung Rudd, MD     Family History  Problem Relation Age of Onset  . Cancer Mother 52       Lung cancer  . Leukemia Sister  83  . Cancer Maternal Aunt     Social History   Socioeconomic History  . Marital status: Divorced    Spouse name: Not on file  . Number of children: Not on file  . Years of education: Not on file  . Highest education level: Not on file  Occupational History  . Occupation: Truck [employer] operational officer  . Financial resource strain: Not  on file  . Food insecurity:    Worry: Sometimes true    Inability: Sometimes true  . Transportation needs:    Medical: No    Non-medical: No  Tobacco Use  . Smoking status: Former Research scientist (life sciences)  . Smokeless tobacco: Never Used  . Tobacco comment: quit at age 63  Substance and Sexual Activity  . Alcohol use: Yes    Comment: occ- none since 08/2017  . Drug use: No  . Sexual activity: Not on file  Lifestyle  . Physical activity:    Days per week: Not on file    Minutes per session: Not on file  . Stress: Not on file  Relationships  . Social connections:    Talks on phone: Not on file    Gets together: Not on file    Attends religious service: Not on file    Active member of club or organization: Not on file    Attends meetings of clubs or organizations: Not on file    Relationship status: Not on file  Other Topics Concern  . Not on file  Social History Narrative  . Not on file      Review of Systems see above: Currently without fever, headache, chest pain, dyspnea, cough, back pain, nausea, vomiting or bleeding.  He does have some intermittent pelvic discomfort.  Vital Signs: BP 113/68 (BP Location: Right Arm)   Pulse 90   Temp 98.9 F (37.2 C) (Oral)   Resp 18   Ht 5\' 9"  (1.753 m)   Wt 179 lb 8 oz (81.4 kg)   SpO2 98%   BMI 26.51 kg/m   Physical Exam, alert.  Chest clear to auscultation bilaterally.  Heart with regular rate and rhythm.  Abdomen soft, right lower quadrant ostomy and left surgical drain in place.  Few bowel sounds.  Mild pelvic tenderness to palpation; no lower extremity edema  Imaging: Ct Abdomen Pelvis W Contrast  Result Date: 02/09/2018 CLINICAL DATA:  Fever and weakness. Evaluate for abscess. History of rectal cancer with colectomy 1 week ago. EXAM: CT ABDOMEN AND PELVIS WITH CONTRAST TECHNIQUE: Multidetector CT imaging of the abdomen and pelvis was performed using the standard protocol following bolus administration of intravenous contrast. CONTRAST:   159mL ISOVUE-300 IOPAMIDOL (ISOVUE-300) INJECTION 61% COMPARISON:  02/06/2018 FINDINGS: Lower chest:  Lower lobe atelectasis. Hepatobiliary: No focal liver abnormality.No evidence of biliary obstruction or stone. Pancreas: Unremarkable. Spleen: Unremarkable. Adrenals/Urinary Tract: Negative adrenals. Two left and 1 right renal calculi. Right-sided stone is the largest at 5 mm. No hydronephrosis or ureteral calculus. Unremarkable bladder. Stomach/Bowel: Low anterior resection with diverting ileostomy. A surgical drain traverses presacral fluid that is loculated but without enhancing rim. This fluid is mildly increased, on axial slices 5.6 x 3 cm as compared to 5.5 x 2.2 cm previously. Similar loculated but seemingly non encapsulated fluid within the upper abdominal wall incision, 2.6 cm. Unchanged rim enhancing collection in the superficial interloop space of the right lower quadrant measuring 2.8 cm. This may be contiguous with a more posterior collection measuring 2.2 cm. Discrete collection in the  upper right hemipelvis that is mildly larger at 2.5 cm AP (previously 2.1 cm). Contiguous right lower quadrant small bowel loops remain thickened from submucosal edema. Fluid is seen within the colon despite diversion. Surgical drain traverses the presacral collection. Vascular/Lymphatic: No acute vascular abnormality. Reproductive:Negative. Other: Collections as above. Small volume pneumoperitoneum, expected. Musculoskeletal: No acute abnormalities. IMPRESSION: 1. Three discrete rim enhancing fluid collections in the right lower quadrant, maximal up to 2.8 cm. Only 1 has enlarged by few mm since study 3 days prior. 2. Persistent right lower quadrant enteritis adjacent to the collections, possible sign of underlying infection. 3. Loculated but seemingly non encapsulated presacral and incisional fluid with mild presacral fluid increase. 4. Stable fluid throughout the proximal colon in this patient with diverting  ileostomy. No small bowel ileus or obstruction. 5. Lower lobe atelectasis. Electronically Signed   By: Monte Fantasia M.D.   On: 02/09/2018 11:19   Ct Abdomen Pelvis W Contrast  Result Date: 02/06/2018 CLINICAL DATA:  Mid abdominal pain and low-grade fever. Status post low anterior rectal sigmoid resection. Evaluate for abscess. EXAM: CT ABDOMEN AND PELVIS WITH CONTRAST TECHNIQUE: Multidetector CT imaging of the abdomen and pelvis was performed using the standard protocol following bolus administration of intravenous contrast. CONTRAST:  155mL ISOVUE-300 IOPAMIDOL (ISOVUE-300) INJECTION 61% COMPARISON:  09/15/2017 FINDINGS: Lower chest: Subsegmental atelectasis is identified within both lung bases. Hepatobiliary: No focal liver abnormality. The gallbladder appears normal. No biliary dilatation. Pancreas: Unremarkable. No pancreatic ductal dilatation or surrounding inflammatory changes. Spleen: Normal in size without focal abnormality. Adrenals/Urinary Tract: The adrenal glands appear normal. Stone within the inferior pole of the right kidney measures 3 mm, image 38/2. Several left renal calculi are identified within the inferior pole of the left kidney which measure up to 3 mm. Right pelvocaliectasis and mild hydroureter identified. No left-sided hydronephrosis or hydroureter. Urinary bladder appears normal. Stomach/Bowel: Stomach is normal. The mid and distal small bowel loops appeared increased in caliber measuring up to 3.2 cm, image 55/2. There is abnormal wall thickening involving the distal small bowel loops within the right lower quadrant of the abdomen. Single wall thickness measures up to 7 mm. No pneumatosis. There are inflammatory changes and small interloop fluid collections identified in the right lower quadrant of the abdomen and pelvis.No evidence for small bowel obstruction, enteric contrast material is identified up to the level of the distal small bowel and entering the right lower quadrant  ileostomy. No pathologic dilatation of the colon. Colo-anal anastomosis is identified with suture line, image 86/2. Vascular/Lymphatic: Aortic atherosclerosis. No aneurysm. No upper abdominal adenopathy. No pelvic or inguinal adenopathy. Reproductive: Prostate is unremarkable. Other: Pneumoperitoneum is identified compatible with recent surgery. There is gas identified within the lower ventral abdominal and pelvic wall. Low attenuation edema within the lower portion of the rectus musculature is identified. Right lower quadrant fluid collection is identified measuring 1.8 by 3.1 by 3.2 cm, image 64/2. Within the right side of pelvis there is a second fluid collection measuring 2.1 by 1.8 by 2.4 cm, image 70/2. There is a moderate amount of partially loculated fluid identified within the presacral soft tissue space measuring 5.5 by 2.2 by 8.5 cm, image 79/2 and 64/6. A surgical drain which enters the abdomen from the left terminates within the presacral soft tissue fluid. Musculoskeletal: No acute or significant osseous findings. IMPRESSION: 1. Small loculated fluid collections are identified within the right lower quadrant of the abdomen and right side of pelvis. Although non-specific, abscesses cannot be excluded.  2. A moderate amount of fluid within the presacral soft tissue space is noted which may be partially loculated. Left-sided surgical drainage catheter terminates within this fluid. 3. Right lower quadrant inflammatory changes are identified with secondary wall thickening and increase caliber of the small bowel loops compatible with enteritis. No evidence for small bowel obstruction. 4. Nonobstructing bilateral renal calculi. Electronically Signed   By: Kerby Moors M.D.   On: 02/06/2018 11:22   Dg C-arm 1-60 Min-no Report  Result Date: 02/01/2018 Fluoroscopy was utilized by the requesting physician.  No radiographic interpretation.    Labs:  CBC: Recent Labs    02/06/18 0415 02/07/18 0531  02/08/18 0442 02/09/18 0428  WBC 12.2* 11.6* 11.3* 11.8*  HGB 14.4 13.3 11.6* 11.1*  HCT 42.7 39.2 35.3* 33.4*  PLT 260 250 291 313    COAGS: No results for input(s): INR, APTT in the last 8760 hours.  BMP: Recent Labs    02/06/18 0415 02/07/18 0531 02/08/18 0442 02/09/18 0428  NA 138 137 135 137  K 3.4* 4.2 4.0 4.0  CL 102 103 104 106  CO2 23 26 21* 22  GLUCOSE 122* 112* 117* 116*  BUN 14 15 11 10   CALCIUM 9.1 8.8* 8.4* 8.6*  CREATININE 1.08 1.21 1.12 1.00  GFRNONAA >60 >60 >60 >60  GFRAA >60 >60 >60 >60    LIVER FUNCTION TESTS: Recent Labs    07/04/17 2154 09/08/17 1812 10/20/17 1000 01/30/18 1536  BILITOT 0.6 0.6 0.41 0.6  AST 19 28 26 28   ALT 27 40 49 24  ALKPHOS 112 105 116 126  PROT 7.2 7.4 7.6 7.5  ALBUMIN 3.6 3.6 3.6 3.8    TUMOR MARKERS: Recent Labs    09/07/17 1141  CEA 58.0*    Assessment and Plan: 56 y.o. male with history of rectal cancer, status post ultra low anterior resection, coloanal anastomosis, takedown of splenic flexure, diverting loop ileostomy and flex sigmoidoscopy on 02/01/18.  He has a surgically placed left abdominal drain and now presents with weakness, fever, mild leukocytosis and imaging findings of rim-enhancing fluid collections in the right lower quadrant as well as loculated but not encapsulated presacral and incisional fluid with mild presacral fluid increase.  Request now received from surgery for drainage of the presacral fluid collection.  Imaging studies have been reviewed by Dr. Kathlene Cote and presacral fluid collection appears amenable to drainage via transgluteal approach.Risks and benefits discussed with the patient including bleeding, infection, damage to adjacent structures, bowel perforation/fistula connection, and sepsis.  All of the patient's questions were answered, patient is agreeable to proceed. Consent signed and in chart.  Procedure tent scheduled for 3/29.  Heparin injections will need to be held until  after procedure.   Thank you for this interesting consult.  I greatly enjoyed meeting Craig [name] and look forward to participating in their care.  A copy of this report was sent to the requesting provider on this date.  Electronically Signed: D. Rowe Robert, PA-C 02/09/2018, 4:30 PM   I spent a total of 30 minutes  in face to face in clinical consultation, greater than 50% of which was counseling/coordinating care for CT-guided drainage of presacral fluid collection

## 2018-02-10 ENCOUNTER — Inpatient Hospital Stay (HOSPITAL_COMMUNITY): Payer: Medicaid Other

## 2018-02-10 DIAGNOSIS — E440 Moderate protein-calorie malnutrition: Secondary | ICD-10-CM

## 2018-02-10 LAB — BASIC METABOLIC PANEL
Anion gap: 9 (ref 5–15)
BUN: 9 mg/dL (ref 6–20)
CALCIUM: 8.8 mg/dL — AB (ref 8.9–10.3)
CO2: 26 mmol/L (ref 22–32)
CREATININE: 1.1 mg/dL (ref 0.61–1.24)
Chloride: 105 mmol/L (ref 101–111)
GFR calc non Af Amer: >60 mL/min (ref >60)
Glucose, Bld: 111 mg/dL — ABNORMAL HIGH (ref 65–99)
Potassium: 4.2 mmol/L (ref 3.5–5.1)
SODIUM: 140 mmol/L (ref 135–145)

## 2018-02-10 LAB — CBC WITH DIFFERENTIAL/PLATELET
BASOS ABS: 0 10*3/uL (ref 0.0–0.1)
Basophils Relative: 0 %
EOS ABS: 0.2 10*3/uL (ref 0.0–0.7)
Eosinophils Relative: 2 %
HCT: 35.3 % — ABNORMAL LOW (ref 39.0–52.0)
Hemoglobin: 12 g/dL — ABNORMAL LOW (ref 13.0–17.0)
LYMPHS ABS: 1.2 10*3/uL (ref 0.7–4.0)
Lymphocytes Relative: 10 %
MCH: 30.8 pg (ref 26.0–34.0)
MCHC: 34 g/dL (ref 30.0–36.0)
MCV: 90.7 fL (ref 78.0–100.0)
MONO ABS: 0.5 10*3/uL (ref 0.1–1.0)
Monocytes Relative: 4 %
NEUTROS ABS: 10.1 10*3/uL — AB (ref 1.7–7.7)
Neutrophils Relative %: 84 %
Platelets: 397 10*3/uL (ref 150–400)
RBC: 3.89 MIL/uL — ABNORMAL LOW (ref 4.22–5.81)
RDW: 13.8 % (ref 11.5–15.5)
WBC: 12 10*3/uL — ABNORMAL HIGH (ref 4.0–10.5)

## 2018-02-10 LAB — PHOSPHORUS: PHOSPHORUS: 3.2 mg/dL (ref 2.5–4.6)

## 2018-02-10 LAB — PROTIME-INR
INR: 1.05
Prothrombin Time: 13.6 seconds (ref 11.4–15.2)

## 2018-02-10 LAB — MAGNESIUM: MAGNESIUM: 2.1 mg/dL (ref 1.7–2.4)

## 2018-02-10 MED ORDER — MIDAZOLAM HCL 2 MG/2ML IJ SOLN
INTRAMUSCULAR | Status: AC | PRN
  Administered 2018-02-10 (×2): 1 mg via INTRAVENOUS

## 2018-02-10 MED ORDER — FENTANYL CITRATE (PF) 100 MCG/2ML IJ SOLN
INTRAMUSCULAR | Status: AC
  Filled 2018-02-10: qty 2

## 2018-02-10 MED ORDER — LIDOCAINE HCL (PF) 1 % IJ SOLN
INTRAMUSCULAR | Status: AC | PRN
  Administered 2018-02-10: 30 mL

## 2018-02-10 MED ORDER — SODIUM CHLORIDE 0.9% FLUSH
5.0000 mL | Freq: Three times a day (TID) | INTRAVENOUS | Status: DC
  Administered 2018-02-10 – 2018-02-14 (×7): 5 mL

## 2018-02-10 MED ORDER — FENTANYL CITRATE (PF) 100 MCG/2ML IJ SOLN
INTRAMUSCULAR | Status: AC | PRN
  Administered 2018-02-10 (×2): 50 ug via INTRAVENOUS

## 2018-02-10 MED ORDER — MIDAZOLAM HCL 2 MG/2ML IJ SOLN
INTRAMUSCULAR | Status: AC
  Filled 2018-02-10: qty 4

## 2018-02-10 NOTE — Procedures (Signed)
L transgluteal presacral abscess drain Dark cloudy fluid EBL 0 Comp 0

## 2018-02-10 NOTE — Progress Notes (Signed)
Patient ID: ADRIEL KESSEN, male   DOB: 11/19/1960, 57 y.o.   MRN: [id number] 9 Days Post-Op   Subjective: Denies abdominal pain or nausea. Ambulating. Drain output low, dark and turbid  in color. Tolerating diet. NPO since MN  Objective: Vital signs in last 24 hours: Temp:  [98.8 F (37.1 C)-100 F (37.8 C)] 100 F (37.8 C) (03/29 0522) Pulse Rate:  [75-90] 76 (03/29 0522) Resp:  [18] 18 (03/29 0522) BP: (113-118)/(68-70) 118/70 (03/29 0522) SpO2:  [97 %-100 %] 97 % (03/29 0522) Weight:  [82 kg (180 lb 12.4 oz)] 82 kg (180 lb 12.4 oz) (03/29 0522) Last BM Date: 02/09/18  Intake/Output from previous day: 03/28 0701 - 03/29 0700 In: 3563.8 [P.O.:1020; I.V.:2393.8; IV Piggyback:150] Out: 4705 [Urine:3750; Drains:55; Stool:900] Intake/Output this shift: Total I/O In: 1973.8 [P.O.:480; I.V.:1393.8; IV Piggyback:100] Out: 2450 [Urine:2200; Stool:250]  General appearance: alert, cooperative and no distress GI: Abdomen is soft, nontender, nondistended. Stoma pink with thick green output Incision/Wound: No erythema or drainage  Lab Results:  Recent Labs    02/09/18 0428 02/10/18 0416  WBC 11.8* 12.0*  HGB 11.1* 12.0*  HCT 33.4* 35.3*  PLT 313 397   BMET Recent Labs    02/09/18 0428 02/10/18 0416  NA 137 140  K 4.0 4.2  CL 106 105  CO2 22 26  GLUCOSE 116* 111*  BUN 10 9  CREATININE 1.00 1.10  CALCIUM 8.6* 8.8*     Studies/Results: Ct Abdomen Pelvis W Contrast  Result Date: 02/09/2018 CLINICAL DATA:  Fever and weakness. Evaluate for abscess. History of rectal cancer with colectomy 1 week ago. EXAM: CT ABDOMEN AND PELVIS WITH CONTRAST TECHNIQUE: Multidetector CT imaging of the abdomen and pelvis was performed using the standard protocol following bolus administration of intravenous contrast. CONTRAST:  149mL ISOVUE-300 IOPAMIDOL (ISOVUE-300) INJECTION 61% COMPARISON:  02/06/2018 FINDINGS: Lower chest:  Lower lobe atelectasis. Hepatobiliary: No focal liver abnormality.No  evidence of biliary obstruction or stone. Pancreas: Unremarkable. Spleen: Unremarkable. Adrenals/Urinary Tract: Negative adrenals. Two left and 1 right renal calculi. Right-sided stone is the largest at 5 mm. No hydronephrosis or ureteral calculus. Unremarkable bladder. Stomach/Bowel: Low anterior resection with diverting ileostomy. A surgical drain traverses presacral fluid that is loculated but without enhancing rim. This fluid is mildly increased, on axial slices 5.6 x 3 cm as compared to 5.5 x 2.2 cm previously. Similar loculated but seemingly non encapsulated fluid within the upper abdominal wall incision, 2.6 cm. Unchanged rim enhancing collection in the superficial interloop space of the right lower quadrant measuring 2.8 cm. This may be contiguous with a more posterior collection measuring 2.2 cm. Discrete collection in the upper right hemipelvis that is mildly larger at 2.5 cm AP (previously 2.1 cm). Contiguous right lower quadrant small bowel loops remain thickened from submucosal edema. Fluid is seen within the colon despite diversion. Surgical drain traverses the presacral collection. Vascular/Lymphatic: No acute vascular abnormality. Reproductive:Negative. Other: Collections as above. Small volume pneumoperitoneum, expected. Musculoskeletal: No acute abnormalities. IMPRESSION: 1. Three discrete rim enhancing fluid collections in the right lower quadrant, maximal up to 2.8 cm. Only 1 has enlarged by few mm since study 3 days prior. 2. Persistent right lower quadrant enteritis adjacent to the collections, possible sign of underlying infection. 3. Loculated but seemingly non encapsulated presacral and incisional fluid with mild presacral fluid increase. 4. Stable fluid throughout the proximal colon in this patient with diverting ileostomy. No small bowel ileus or obstruction. 5. Lower lobe atelectasis. Electronically Signed   By:  Monte Fantasia M.D.   On: 02/09/2018 11:19     Anti-infectives: Anti-infectives (From admission, onward)   Start     Dose/Rate Route Frequency Ordered Stop   02/06/18 0900  piperacillin-tazobactam (ZOSYN) IVPB 3.375 g     3.375 g 12.5 mL/hr over 240 Minutes Intravenous Every 8 hours 02/06/18 0805     02/01/18 1005  cefoTEtan in Dextrose 5% (CEFOTAN) IVPB 2 g     2 g Intravenous On call to O.R. 02/01/18 1005 02/01/18 1345      Assessment/Plan: s/p Procedure(s): LAPAROSCOPIC  LOW ANTERIOR RECTOSIGMOID RESECTION, COLOANAL ANASTOMOSIS;  DIVERTING LOOP ILESTOMY; FLEXIBLE SIGMOIDOSCOPY CYSTOSCOPY WITH RETROGRADE PYELOGRAM, URETEROSCOPY AND URETERAL CATHETERS BILATERAL POD#9  -IR consulted for perc drain placement - planning to drain presacral collection given persistence on imaging and WBC remaining elevated at 12 -Continue IV Zosyn for time being -High ileostomy output - Improved with metamucil and imodium to 4mg  TID; continue MIVF for time being. GI Soft diet once done with IR procedure. -Ambulate 5x/day -PPx: SQH, SCDs -Dispo: Continue to monitor; IV abx until wbc normalizing and source controlled   LOS: 9 days   Sharon Mt. Dema Severin, M.D. General and Colorectal Surgery [facility]

## 2018-02-10 NOTE — Progress Notes (Signed)
Initial Nutrition Assessment  DOCUMENTATION CODES:   Non-severe (moderate) malnutrition in context of acute illness/injury  INTERVENTION:   - Continue Ensure Surgery PO BID, each provides 330 kcal and 18 grams of protein  NUTRITION DIAGNOSIS:   Moderate Malnutrition related to acute illness, post-op healing, cancer and cancer related treatments(rectal cancer s/p diverting loop ileostomy on 02/01/18) as evidenced by percent weight loss, mild fat depletion, mild muscle depletion(3.2% weight loss in 9 days).  GOAL:   Patient will meet greater than or equal to 90% of their needs  MONITOR:   Diet advancement, Supplement acceptance, Weight trends, Skin, I & O's  REASON FOR ASSESSMENT:   LOS    ASSESSMENT:   57 year old male who presented to [facility] for surgery. Pt with rectal cancer found on first ever colonoscopy on 09/08/17 and is s/p neoadjuvant chemo-radiation with Xeloda completed on 11/23/17.  02/01/18 - s/p laparoscopic ultra-low anterior resection, coloanal anastomosis, takedown of splenic flexure, diverting loop ileostomy, and flexible sigmoidoscopy  Pt with high ileostomy output likely due in part to fluid collections seen in RLQ with associated enteritis. Pt scheduled for CT guided drainage of presacral fluid collection today at 1300.  Spoke with pt at bedside prior to scheduled procedure. Pt reports having a "good" appetite during current hospital admission and a "great" appetite PTA. Pt states he usually eats at least 2 meals daily and has been tolerating his diet advancements well since procedure on 02/01/18. Pt denies nausea.  Pt has been tolerating the Ensure Surgery supplements well and would like to continue receiving them.  Pt states his UBW is 183 lbs and that he last weighed this at admission. Pt states he now weighs 175 lbs. Per chart, pt's weighed has fluctuated during current admission and is now 6 lbs lower than weight measured on admission. This is a 3.2% weight loss  in 9 days which is significant for timeframe. It appears that pt's weight has been steadily increasing over the past 3 days.  Spoke with RN who states that per Case Manager, pt will likely discharge on Monday. RN reports no nutrition-related issues.  I/Os: -8.8 L since admission  Medications reviewed and include: 4 mg Imodium TID, 1 packed psyllium BID, IV antibiotics, lactated ringers infusion  Labs reviewed: hemoglobin 12.0, HCT 35.3  NUTRITION - FOCUSED PHYSICAL EXAM:    Most Recent Value  Orbital Region  No depletion  Upper Arm Region  Mild depletion  Thoracic and Lumbar Region  Mild depletion  Buccal Region  No depletion  Temple Region  No depletion  Clavicle Bone Region  No depletion  Clavicle and Acromion Bone Region  No depletion  Scapular Bone Region  No depletion  Dorsal Hand  No depletion  Patellar Region  Mild depletion  Anterior Thigh Region  No depletion  Posterior Calf Region  Mild depletion  Edema (RD Assessment)  None  Hair  Reviewed  Eyes  Reviewed  Mouth  Reviewed  Skin  Reviewed  Nails  Reviewed       Diet Order:  Diet NPO time specified Except for: Sips with Meds  EDUCATION NEEDS:   No education needs have been identified at this time  Skin:  Skin Assessment: Skin Integrity Issues: Skin Integrity Issues:: Incisions Incisions: surgical incision to abdomen  Last BM:  02/09/18  Height:   Ht Readings from Last 1 Encounters:  02/01/18 5\' 9"  (1.753 m)    Weight:   Wt Readings from Last 1 Encounters:  02/10/18 180 lb 12.4 oz (  82 kg)    Ideal Body Weight:  72.7 kg  BMI:  Body mass index is 26.7 kg/m.  Estimated Nutritional Needs:   Kcal:  2400-2600 kcal/day (29-32 kcal/kg)  Protein:  105-120 grams/day (1.3-1.5 g/kg)  Fluid:  2.4-2.6 L/day    Gaynell Face, MS, RD, LDN Pager: [phone] Weekend/After Hours: [phone]

## 2018-02-11 LAB — CBC WITH DIFFERENTIAL/PLATELET
BASOS PCT: 0 %
Basophils Absolute: 0 10*3/uL (ref 0.0–0.1)
EOS PCT: 2 %
Eosinophils Absolute: 0.2 10*3/uL (ref 0.0–0.7)
HEMATOCRIT: 35.5 % — AB (ref 39.0–52.0)
Hemoglobin: 12 g/dL — ABNORMAL LOW (ref 13.0–17.0)
LYMPHS ABS: 1.2 10*3/uL (ref 0.7–4.0)
Lymphocytes Relative: 11 %
MCH: 30.8 pg (ref 26.0–34.0)
MCHC: 33.8 g/dL (ref 30.0–36.0)
MCV: 91.3 fL (ref 78.0–100.0)
MONOS PCT: 3 %
Monocytes Absolute: 0.3 10*3/uL (ref 0.1–1.0)
NEUTROS ABS: 8.8 10*3/uL — AB (ref 1.7–7.7)
Neutrophils Relative %: 84 %
Platelets: 447 10*3/uL — ABNORMAL HIGH (ref 150–400)
RBC: 3.89 MIL/uL — ABNORMAL LOW (ref 4.22–5.81)
RDW: 13.7 % (ref 11.5–15.5)
WBC: 10.5 10*3/uL (ref 4.0–10.5)

## 2018-02-11 LAB — BASIC METABOLIC PANEL
Anion gap: 10 (ref 5–15)
BUN: 12 mg/dL (ref 6–20)
CALCIUM: 8.8 mg/dL — AB (ref 8.9–10.3)
CHLORIDE: 103 mmol/L (ref 101–111)
CO2: 25 mmol/L (ref 22–32)
CREATININE: 1.14 mg/dL (ref 0.61–1.24)
GFR calc Af Amer: >60 mL/min (ref >60)
GFR calc non Af Amer: >60 mL/min (ref >60)
Glucose, Bld: 120 mg/dL — ABNORMAL HIGH (ref 65–99)
Potassium: 4.2 mmol/L (ref 3.5–5.1)
Sodium: 138 mmol/L (ref 135–145)

## 2018-02-11 LAB — MAGNESIUM: Magnesium: 2.3 mg/dL (ref 1.7–2.4)

## 2018-02-11 LAB — PHOSPHORUS: PHOSPHORUS: 3.8 mg/dL (ref 2.5–4.6)

## 2018-02-11 NOTE — Progress Notes (Signed)
Patient spent great deal of time visiting with visitors at bedside. No acute distress noted. Assessment completed later, no c/o voiced. Drains intact and functional. Pt previously ambulated in hall way without assistance from staff. Tolerated well. Checked frequently, resting quietly at long intervals with eyes closed, resp even and unlabored. Uneventful night.

## 2018-02-11 NOTE — Progress Notes (Addendum)
Patient ID: Craig [name], male   DOB: [birth year], 57 y.o.   MRN: [id number] 10 Days Post-Op   Subjective: Pain well controlled. No nausea, tolerating diet  Objective: Vital signs in last 24 hours: Temp:  [98.7 F (37.1 C)-100.8 F (38.2 C)] 98.7 F (37.1 C) (03/30 0500) Pulse Rate:  [71-85] 71 (03/30 0500) Resp:  [15-19] 18 (03/30 0500) BP: (109-129)/(66-81) 109/71 (03/30 0500) SpO2:  [97 %-100 %] 98 % (03/30 0500) Weight:  [83.6 kg (184 lb 4.9 oz)] 83.6 kg (184 lb 4.9 oz) (03/30 0500) Last BM Date: 02/11/18  Intake/Output from previous day: 03/29 0701 - 03/30 0700 In: 468.3 [P.O.:240; I.V.:173.3; IV Piggyback:50] Out: 2699 [Urine:1800; Drains:149; Stool:750] Intake/Output this shift: Total I/O In: 240 [P.O.:240] Out: 825 [Urine:625; Stool:200]  General appearance: alert, cooperative and no distress GI: Abdomen is soft, nontender, nondistended. Stoma pink with thick green output Incision/Wound: No erythema or drainage Drains with purulent/ brown turbid output  Lab Results:  Recent Labs    02/10/18 0416 02/11/18 0438  WBC 12.0* 10.5  HGB 12.0* 12.0*  HCT 35.3* 35.5*  PLT 397 447*   BMET Recent Labs    02/10/18 0416 02/11/18 0438  NA 140 138  K 4.2 4.2  CL 105 103  CO2 26 25  GLUCOSE 111* 120*  BUN 9 12  CREATININE 1.10 1.14  CALCIUM 8.8* 8.8*     Studies/Results: Ct Abdomen Pelvis W Contrast  Result Date: 02/09/2018 CLINICAL DATA:  Fever and weakness. Evaluate for abscess. History of rectal cancer with colectomy 1 week ago. EXAM: CT ABDOMEN AND PELVIS WITH CONTRAST TECHNIQUE: Multidetector CT imaging of the abdomen and pelvis was performed using the standard protocol following bolus administration of intravenous contrast. CONTRAST:  169mL ISOVUE-300 IOPAMIDOL (ISOVUE-300) INJECTION 61% COMPARISON:  02/06/2018 FINDINGS: Lower chest:  Lower lobe atelectasis. Hepatobiliary: No focal liver abnormality.No evidence of biliary obstruction or stone. Pancreas:  Unremarkable. Spleen: Unremarkable. Adrenals/Urinary Tract: Negative adrenals. Two left and 1 right renal calculi. Right-sided stone is the largest at 5 mm. No hydronephrosis or ureteral calculus. Unremarkable bladder. Stomach/Bowel: Low anterior resection with diverting ileostomy. A surgical drain traverses presacral fluid that is loculated but without enhancing rim. This fluid is mildly increased, on axial slices 5.6 x 3 cm as compared to 5.5 x 2.2 cm previously. Similar loculated but seemingly non encapsulated fluid within the upper abdominal wall incision, 2.6 cm. Unchanged rim enhancing collection in the superficial interloop space of the right lower quadrant measuring 2.8 cm. This may be contiguous with a more posterior collection measuring 2.2 cm. Discrete collection in the upper right hemipelvis that is mildly larger at 2.5 cm AP (previously 2.1 cm). Contiguous right lower quadrant small bowel loops remain thickened from submucosal edema. Fluid is seen within the colon despite diversion. Surgical drain traverses the presacral collection. Vascular/Lymphatic: No acute vascular abnormality. Reproductive:Negative. Other: Collections as above. Small volume pneumoperitoneum, expected. Musculoskeletal: No acute abnormalities. IMPRESSION: 1. Three discrete rim enhancing fluid collections in the right lower quadrant, maximal up to 2.8 cm. Only 1 has enlarged by few mm since study 3 days prior. 2. Persistent right lower quadrant enteritis adjacent to the collections, possible sign of underlying infection. 3. Loculated but seemingly non encapsulated presacral and incisional fluid with mild presacral fluid increase. 4. Stable fluid throughout the proximal colon in this patient with diverting ileostomy. No small bowel ileus or obstruction. 5. Lower lobe atelectasis. Electronically Signed   By: Monte Fantasia M.D.   On: 02/09/2018 11:19  Ct Image Guided Drainage By Percutaneous Catheter  Result Date:  02/10/2018 INDICATION: Presacral abscess EXAM: CT GUIDED DRAINAGE OF A PRESACRAL ABSCESS MEDICATIONS: The patient is currently admitted to the hospital and receiving intravenous antibiotics. The antibiotics were administered within an appropriate time frame prior to the initiation of the procedure. ANESTHESIA/SEDATION: Two mg IV Versed 100 mcg IV Fentanyl Moderate Sedation Time:  17 minutes The patient was continuously monitored during the procedure by the interventional radiology nurse under my direct supervision. COMPLICATIONS: None immediate. TECHNIQUE: Informed written consent was obtained from the patient after a thorough discussion of the procedural risks, benefits and alternatives. All questions were addressed. Maximal Sterile Barrier Technique was utilized including caps, mask, sterile gowns, sterile gloves, sterile drape, hand hygiene and skin antiseptic. A timeout was performed prior to the initiation of the procedure. PROCEDURE: The left gluteal region in the prone position was prepped with ChloraPrep in a sterile fashion, and a sterile drape was applied covering the operative field. A sterile gown and sterile gloves were used for the procedure. Local anesthesia was provided with 1% Lidocaine. Under CT guidance, an 18 gauge needle was inserted into the presacral abscess. It was removed over an Amplatz wire. Ten [place] dilator followed by a 10 [place] drain were inserted. It was looped and string fixed in the fluid collection. It was then sewn to the skin. Dark pus was aspirated. FINDINGS: Images demonstrate placement of a 10 French left trans gluteal pelvic abscess drain. IMPRESSION: Successful left trans gluteal 10 French pelvic abscess drain. Electronically Signed   By: Marybelle Killings M.D.   On: 02/10/2018 15:05    Anti-infectives: Anti-infectives (From admission, onward)   Start     Dose/Rate Route Frequency Ordered Stop   02/06/18 0900  piperacillin-tazobactam (ZOSYN) IVPB 3.375 g     3.375  g 12.5 mL/hr over 240 Minutes Intravenous Every 8 hours 02/06/18 0805     02/01/18 1005  cefoTEtan in Dextrose 5% (CEFOTAN) IVPB 2 g     2 g Intravenous On call to O.R. 02/01/18 1005 02/01/18 1345      Assessment/Plan: s/p Procedure(s): LAPAROSCOPIC  LOW ANTERIOR RECTOSIGMOID RESECTION, COLOANAL ANASTOMOSIS;  DIVERTING LOOP ILESTOMY; FLEXIBLE SIGMOIDOSCOPY CYSTOSCOPY WITH RETROGRADE PYELOGRAM, URETEROSCOPY AND URETERAL CATHETERS BILATERAL POD#9  -s/p IR drain x 2 -Continue IV Zosyn for time being -High ileostomy output - Improved with metamucil and imodium to 4mg  TID. GI Soft diet  -Ambulate 5x/day -PPx: SQH, SCDs -Dispo: Continue to monitor; IV abx until wbc normalizing and source controlled   LOS: 10 days   [facility]

## 2018-02-12 LAB — CBC WITH DIFFERENTIAL/PLATELET
BASOS PCT: 0 %
Basophils Absolute: 0 10*3/uL (ref 0.0–0.1)
EOS PCT: 2 %
Eosinophils Absolute: 0.2 10*3/uL (ref 0.0–0.7)
HEMATOCRIT: 38.3 % — AB (ref 39.0–52.0)
Hemoglobin: 13 g/dL (ref 13.0–17.0)
LYMPHS ABS: 0.9 10*3/uL (ref 0.7–4.0)
Lymphocytes Relative: 8 %
MCH: 31 pg (ref 26.0–34.0)
MCHC: 33.9 g/dL (ref 30.0–36.0)
MCV: 91.4 fL (ref 78.0–100.0)
MONOS PCT: 7 %
Monocytes Absolute: 0.8 10*3/uL (ref 0.1–1.0)
NEUTROS ABS: 9.9 10*3/uL — AB (ref 1.7–7.7)
Neutrophils Relative %: 83 %
Platelets: 598 10*3/uL — ABNORMAL HIGH (ref 150–400)
RBC: 4.19 MIL/uL — AB (ref 4.22–5.81)
RDW: 13.6 % (ref 11.5–15.5)
WBC: 11.8 10*3/uL — AB (ref 4.0–10.5)

## 2018-02-12 LAB — MAGNESIUM: Magnesium: 2.3 mg/dL (ref 1.7–2.4)

## 2018-02-12 LAB — PHOSPHORUS: Phosphorus: 3.1 mg/dL (ref 2.5–4.6)

## 2018-02-12 NOTE — Progress Notes (Signed)
Patient ID: Craig [name], male   DOB: 08-19-1961, 57 y.o.   MRN: [id number] 11 Days Post-Op   Subjective: No complaints this morning. Walking, eating, ostomy working.  Objective: Vital signs in last 24 hours: Temp:  [99 F (37.2 C)-99.9 F (37.7 C)] 99.7 F (37.6 C) (03/31 0555) Pulse Rate:  [71-76] 75 (03/31 0555) Resp:  [16-17] 17 (03/31 0555) BP: (114-124)/(64-83) 114/64 (03/31 0555) SpO2:  [90 %-99 %] 90 % (03/30 2048) Weight:  [81.5 kg (179 lb 10.8 oz)] 81.5 kg (179 lb 10.8 oz) (03/31 0555) Last BM Date: 02/11/18  Intake/Output from previous day: 03/30 0701 - 03/31 0700 In: 745 [P.O.:540; I.V.:5; IV Piggyback:200] Out: 3345 [Urine:2525; Drains:20; Stool:800] Intake/Output this shift: Total I/O In: -  Out: 325 [Urine:325]  General appearance: alert, cooperative and no distress GI: Abdomen is soft, nontender, nondistended. Stoma pink with thick green output Incision/Wound: No erythema or drainage Drains with seropurulent (anterior) / brown turbid (posterior) output- low volume  Lab Results:  Recent Labs    02/11/18 0438 02/12/18 0531  WBC 10.5 11.8*  HGB 12.0* 13.0  HCT 35.5* 38.3*  PLT 447* 598*   BMET Recent Labs    02/10/18 0416 02/11/18 0438  NA 140 138  K 4.2 4.2  CL 105 103  CO2 26 25  GLUCOSE 111* 120*  BUN 9 12  CREATININE 1.10 1.14  CALCIUM 8.8* 8.8*     Studies/Results: Ct Image Guided Drainage By Percutaneous Catheter  Result Date: 02/10/2018 INDICATION: Presacral abscess EXAM: CT GUIDED DRAINAGE OF A PRESACRAL ABSCESS MEDICATIONS: The patient is currently admitted to the hospital and receiving intravenous antibiotics. The antibiotics were administered within an appropriate time frame prior to the initiation of the procedure. ANESTHESIA/SEDATION: Two mg IV Versed 100 mcg IV Fentanyl Moderate Sedation Time:  17 minutes The patient was continuously monitored during the procedure by the interventional radiology nurse under my direct supervision.  COMPLICATIONS: None immediate. TECHNIQUE: Informed written consent was obtained from the patient after a thorough discussion of the procedural risks, benefits and alternatives. All questions were addressed. Maximal Sterile Barrier Technique was utilized including caps, mask, sterile gowns, sterile gloves, sterile drape, hand hygiene and skin antiseptic. A timeout was performed prior to the initiation of the procedure. PROCEDURE: The left gluteal region in the prone position was prepped with ChloraPrep in a sterile fashion, and a sterile drape was applied covering the operative field. A sterile gown and sterile gloves were used for the procedure. Local anesthesia was provided with 1% Lidocaine. Under CT guidance, an 18 gauge needle was inserted into the presacral abscess. It was removed over an Amplatz wire. Ten [place] dilator followed by a 10 [place] drain were inserted. It was looped and string fixed in the fluid collection. It was then sewn to the skin. Dark pus was aspirated. FINDINGS: Images demonstrate placement of a 10 French left trans gluteal pelvic abscess drain. IMPRESSION: Successful left trans gluteal 10 French pelvic abscess drain. Electronically Signed   By: Marybelle Killings M.D.   On: 02/10/2018 15:05    Anti-infectives: Anti-infectives (From admission, onward)   Start     Dose/Rate Route Frequency Ordered Stop   02/06/18 0900  piperacillin-tazobactam (ZOSYN) IVPB 3.375 g     3.375 g 12.5 mL/hr over 240 Minutes Intravenous Every 8 hours 02/06/18 0805     02/01/18 1005  cefoTEtan in Dextrose 5% (CEFOTAN) IVPB 2 g     2 g Intravenous On call to O.R. 02/01/18 1005 02/01/18 1345  Assessment/Plan: s/p Procedure(s): LAPAROSCOPIC  LOW ANTERIOR RECTOSIGMOID RESECTION, COLOANAL ANASTOMOSIS;  DIVERTING LOOP ILESTOMY; FLEXIBLE SIGMOIDOSCOPY CYSTOSCOPY WITH RETROGRADE PYELOGRAM, URETEROSCOPY AND URETERAL CATHETERS BILATERAL   -s/p IR drain x 2 -Continue IV Zosyn for time being -High ileostomy  output - Improved with metamucil and imodium to 4mg  TID. GI Soft diet  -Ambulate 5x/day -PPx: SQH, SCDs -Dispo: Continue to monitor; IV abx. WBC crept up slightly but clinically he looks well.    LOS: 11 days   [facility]

## 2018-02-13 LAB — CBC WITH DIFFERENTIAL/PLATELET
BASOS ABS: 0 10*3/uL (ref 0.0–0.1)
BASOS PCT: 0 %
EOS PCT: 2 %
Eosinophils Absolute: 0.2 10*3/uL (ref 0.0–0.7)
HEMATOCRIT: 35.4 % — AB (ref 39.0–52.0)
Hemoglobin: 12.2 g/dL — ABNORMAL LOW (ref 13.0–17.0)
Lymphocytes Relative: 5 %
Lymphs Abs: 0.5 10*3/uL — ABNORMAL LOW (ref 0.7–4.0)
MCH: 31.1 pg (ref 26.0–34.0)
MCHC: 34.5 g/dL (ref 30.0–36.0)
MCV: 90.3 fL (ref 78.0–100.0)
MONO ABS: 0.9 10*3/uL (ref 0.1–1.0)
MONOS PCT: 10 %
Neutro Abs: 7.7 10*3/uL (ref 1.7–7.7)
Neutrophils Relative %: 83 %
Platelets: 614 10*3/uL — ABNORMAL HIGH (ref 150–400)
RBC: 3.92 MIL/uL — ABNORMAL LOW (ref 4.22–5.81)
RDW: 13.6 % (ref 11.5–15.5)
WBC: 9.4 10*3/uL (ref 4.0–10.5)

## 2018-02-13 LAB — PHOSPHORUS: PHOSPHORUS: 3.4 mg/dL (ref 2.5–4.6)

## 2018-02-13 LAB — MAGNESIUM: Magnesium: 2.2 mg/dL (ref 1.7–2.4)

## 2018-02-13 MED ORDER — METRONIDAZOLE 500 MG PO TABS
500.0000 mg | ORAL_TABLET | Freq: Three times a day (TID) | ORAL | Status: DC
  Administered 2018-02-13 – 2018-02-14 (×4): 500 mg via ORAL
  Filled 2018-02-13 (×4): qty 1

## 2018-02-13 MED ORDER — CIPROFLOXACIN HCL 500 MG PO TABS
500.0000 mg | ORAL_TABLET | Freq: Two times a day (BID) | ORAL | Status: DC
  Administered 2018-02-13 – 2018-02-14 (×3): 500 mg via ORAL
  Filled 2018-02-13 (×3): qty 1

## 2018-02-13 MED ORDER — GABAPENTIN 300 MG PO CAPS
300.0000 mg | ORAL_CAPSULE | Freq: Three times a day (TID) | ORAL | Status: DC
  Administered 2018-02-13 – 2018-02-14 (×4): 300 mg via ORAL
  Filled 2018-02-13 (×4): qty 1

## 2018-02-13 NOTE — Progress Notes (Signed)
Referring Physician(s): Baring  Supervising Physician: Corrie Mckusick  Patient Status:  [facility] - In-pt  Chief Complaint:  Pelvic abscess  Subjective: Pt doing ok today; has some soreness at left TG drain site as expected; denies N/V; tol diet ok   Allergies: Patient has no known allergies.  Medications: Prior to Admission medications   Medication Sig Start Date End Date Taking? Authorizing Provider  Acetaminophen 500 MG coapsule Take 1,000 mg by mouth every 6 (six) hours as needed for moderate pain or headache.    Yes [provider]  diphenhydrAMINE (SOMINEX) 25 MG tablet Take 75 mg by mouth at bedtime as needed for sleep.   Yes [provider]  docusate sodium (COLACE) 100 MG capsule Take 2 capsules (200 mg total) by mouth daily. 11/02/17  Yes Ladell Pier, MD  naphazoline-glycerin (CLEAR EYES REDNESS) 0.012-0.2 % SOLN 1-2 drops 4 (four) times daily as needed for eye irritation.   Yes [provider]  oxymetazoline (AFRIN) 0.05 % nasal spray Place 1 spray into both nostrils daily as needed for congestion.   Yes [provider]  oxyCODONE (OXY IR/ROXICODONE) 5 MG immediate release tablet Take 1 tablet (5 mg total) by mouth every 4 (four) hours as needed for severe pain (may take 1-2 tablets every 4 hours prn pain). Patient not taking: Reported on 01/30/2018 11/23/17   Gery Pray, MD  polyethylene glycol [facility] Floria Raveling) packet Take 17 g by mouth daily. Patient not taking: Reported on 01/04/2018 11/02/17   Ladell Pier, MD  prochlorperazine (COMPAZINE) 10 MG tablet Take 1 tablet (10 mg total) by mouth every 6 (six) hours as needed for nausea or vomiting. Patient not taking: Reported on 01/04/2018 10/21/17   Kyung Rudd, MD     Vital Signs: BP 113/68 (BP Location: Right Arm)   Pulse 71   Temp 99.7 F (37.6 C) (Oral)   Resp 18   Ht 5\' 9"  (1.753 m)   Wt 179 lb 10.8 oz (81.5 kg)   SpO2 99%   BMI 26.53 kg/m   Physical Exam  alert; left TG drain intact, insertion site ok, mildly tender, output 35 cc brown?feculent fluid, cx pend    Imaging: Ct Abdomen Pelvis W Contrast  Result Date: 02/09/2018 CLINICAL DATA:  Fever and weakness. Evaluate for abscess. History of rectal cancer with colectomy 1 week ago. EXAM: CT ABDOMEN AND PELVIS WITH CONTRAST TECHNIQUE: Multidetector CT imaging of the abdomen and pelvis was performed using the standard protocol following bolus administration of intravenous contrast. CONTRAST:  147mL ISOVUE-300 IOPAMIDOL (ISOVUE-300) INJECTION 61% COMPARISON:  02/06/2018 FINDINGS: Lower chest:  Lower lobe atelectasis. Hepatobiliary: No focal liver abnormality.No evidence of biliary obstruction or stone. Pancreas: Unremarkable. Spleen: Unremarkable. Adrenals/Urinary Tract: Negative adrenals. Two left and 1 right renal calculi. Right-sided stone is the largest at 5 mm. No hydronephrosis or ureteral calculus. Unremarkable bladder. Stomach/Bowel: Low anterior resection with diverting ileostomy. A surgical drain traverses presacral fluid that is loculated but without enhancing rim. This fluid is mildly increased, on axial slices 5.6 x 3 cm as compared to 5.5 x 2.2 cm previously. Similar loculated but seemingly non encapsulated fluid within the upper abdominal wall incision, 2.6 cm. Unchanged rim enhancing collection in the superficial interloop space of the right lower quadrant measuring 2.8 cm. This may be contiguous with a more posterior collection measuring 2.2 cm. Discrete collection in the upper right hemipelvis that is mildly larger at 2.5 cm AP (previously 2.1 cm). Contiguous right lower quadrant  small bowel loops remain thickened from submucosal edema. Fluid is seen within the colon despite diversion. Surgical drain traverses the presacral collection. Vascular/Lymphatic: No acute vascular abnormality. Reproductive:Negative. Other: Collections as above. Small volume pneumoperitoneum, expected. Musculoskeletal: No  acute abnormalities. IMPRESSION: 1. Three discrete rim enhancing fluid collections in the right lower quadrant, maximal up to 2.8 cm. Only 1 has enlarged by few mm since study 3 days prior. 2. Persistent right lower quadrant enteritis adjacent to the collections, possible sign of underlying infection. 3. Loculated but seemingly non encapsulated presacral and incisional fluid with mild presacral fluid increase. 4. Stable fluid throughout the proximal colon in this patient with diverting ileostomy. No small bowel ileus or obstruction. 5. Lower lobe atelectasis. Electronically Signed   By: Monte Fantasia M.D.   On: 02/09/2018 11:19   Ct Image Guided Drainage By Percutaneous Catheter  Result Date: 02/10/2018 INDICATION: Presacral abscess EXAM: CT GUIDED DRAINAGE OF A PRESACRAL ABSCESS MEDICATIONS: The patient is currently admitted to the hospital and receiving intravenous antibiotics. The antibiotics were administered within an appropriate time frame prior to the initiation of the procedure. ANESTHESIA/SEDATION: Two mg IV Versed 100 mcg IV Fentanyl Moderate Sedation Time:  17 minutes The patient was continuously monitored during the procedure by the interventional radiology nurse under my direct supervision. COMPLICATIONS: None immediate. TECHNIQUE: Informed written consent was obtained from the patient after a thorough discussion of the procedural risks, benefits and alternatives. All questions were addressed. Maximal Sterile Barrier Technique was utilized including caps, mask, sterile gowns, sterile gloves, sterile drape, hand hygiene and skin antiseptic. A timeout was performed prior to the initiation of the procedure. PROCEDURE: The left gluteal region in the prone position was prepped with ChloraPrep in a sterile fashion, and a sterile drape was applied covering the operative field. A sterile gown and sterile gloves were used for the procedure. Local anesthesia was provided with 1% Lidocaine. Under CT  guidance, an 18 gauge needle was inserted into the presacral abscess. It was removed over an Amplatz wire. Ten [place] dilator followed by a 10 [place] drain were inserted. It was looped and string fixed in the fluid collection. It was then sewn to the skin. Dark pus was aspirated. FINDINGS: Images demonstrate placement of a 10 French left trans gluteal pelvic abscess drain. IMPRESSION: Successful left trans gluteal 10 French pelvic abscess drain. Electronically Signed   By: Marybelle Killings M.D.   On: 02/10/2018 15:05    Labs:  CBC: Recent Labs    02/10/18 0416 02/11/18 0438 02/12/18 0531 02/13/18 0452  WBC 12.0* 10.5 11.8* 9.4  HGB 12.0* 12.0* 13.0 12.2*  HCT 35.3* 35.5* 38.3* 35.4*  PLT 397 447* 598* 614*    COAGS: Recent Labs    02/10/18 0416  INR 1.05    BMP: Recent Labs    02/08/18 0442 02/09/18 0428 02/10/18 0416 02/11/18 0438  NA 135 137 140 138  K 4.0 4.0 4.2 4.2  CL 104 106 105 103  CO2 21* 22 26 25   GLUCOSE 117* 116* 111* 120*  BUN 11 10 9 12   CALCIUM 8.4* 8.6* 8.8* 8.8*  CREATININE 1.12 1.00 1.10 1.14  GFRNONAA >60 >60 >60 >60  GFRAA >60 >60 >60 >60    LIVER FUNCTION TESTS: Recent Labs    07/04/17 2154 09/08/17 1812 10/20/17 1000 01/30/18 1536  BILITOT 0.6 0.6 0.41 0.6  AST 19 28 26 28   ALT 27 40 49 24  ALKPHOS 112 105 116 126  PROT 7.2 7.4 7.6 7.5  ALBUMIN 3.6 3.6 3.6 3.8    Assessment and Plan: Hx rectal cancer, s/p LAR/ileostomy 02/01/18 ; s/p drainage of presacral fluid collection/abscess 02/10/18; temp 99.7; WBC nl; hgb stable; drain fluid cx pend; cont drain flushes with NS; if pt d/c'd home with drain would flush once daily with 5-10 cc sterile NS, record output and change dressing every 1-2 days; he can be scheduled for f/u in IR drain clinic [phone]) after discharge   Electronically Signed: D. Rowe Robert, PA-C 02/13/2018, 10:39 AM   I spent a total of 15 minutes at the the patient's bedside AND on the patient's hospital floor or  unit, greater than 50% of which was counseling/coordinating care for presacral abscess drain    Patient ID: Craig [name], male   DOB: 04-11-1961, 57 y.o.   MRN: [id number]

## 2018-02-13 NOTE — Consult Note (Signed)
[place] Nurse ostomy follow up Stoma type/location: RUQ ileostomy Stomal assessment/size: 1 1/4 inches, no securement device in place. Peristomal assessment: Intact skin Treatment options for stomal/peristomal skin: barrier ring used Output:  Thick green/brown pudding consistency Ostomy pouching: 1pc. Convex cut to fit.  Supplies at bedside. Education provided: Patient removed ostomy pouch placed last week.  He independently cut the barrier, applied the barrier ring, and with minimal assistance placed the pouch, and closed it. He removed the tape border backing and secured the pouch by using the warmth of his hand and tracing around the stoma to facilitate obtaining a seal. Enrolled patient in [place] Start Discharge program: Yes All questions were answered to his expressed satisfaction. Val Riles, RN, MSN, CWOCN, CNS-BC, pager [phone]

## 2018-02-13 NOTE — Progress Notes (Signed)
Contacted [facility] with update today and they are going out of business and asked if I could find another agency to serve this patient. Contacted [facility] who has declined previously but now is willing to accept patient. Notified [facility], [facility] will f/u with the patient at d/c tomorrow. [phone]

## 2018-02-13 NOTE — Progress Notes (Signed)
Patient ID: Craig [name], male   DOB: [birth year], 57 y.o.   MRN: [id number] 12 Days Post-Op   Subjective: No complaints this morning. Walking, eating, ostomy working.  Objective: Vital signs in last 24 hours: Temp:  [98.5 F (36.9 C)-99.8 F (37.7 C)] 99.7 F (37.6 C) (04/01 0544) Pulse Rate:  [68-74] 71 (04/01 0544) Resp:  [16-18] 18 (04/01 0544) BP: (113-120)/(68-75) 113/68 (04/01 0544) SpO2:  [92 %-100 %] 99 % (04/01 0544) Last BM Date: 02/11/18  Intake/Output from previous day: 03/31 0701 - 04/01 0700 In: 205 [I.V.:5; IV Piggyback:200] Out: 1885 [Urine:850; Drains:35; Stool:1000] Intake/Output this shift: No intake/output data recorded.  General appearance: alert, cooperative and no distress GI: Abdomen is soft, nontender, nondistended. Stoma pink with thick green output Incision/Wound: No erythema or drainage Drains with brown turbid / brown turbid (posterior) output- low volume  Lab Results:  Recent Labs    02/12/18 0531 02/13/18 0452  WBC 11.8* 9.4  HGB 13.0 12.2*  HCT 38.3* 35.4*  PLT 598* 614*   BMET Recent Labs    02/11/18 0438  NA 138  K 4.2  CL 103  CO2 25  GLUCOSE 120*  BUN 12  CREATININE 1.14  CALCIUM 8.8*     Studies/Results: No results found.  Anti-infectives: Anti-infectives (From admission, onward)   Start     Dose/Rate Route Frequency Ordered Stop   02/13/18 0815  ciprofloxacin (CIPRO) tablet 500 mg     500 mg Oral 2 times daily 02/13/18 3383     02/13/18 0815  metroNIDAZOLE (FLAGYL) tablet 500 mg     500 mg Oral Every 8 hours 02/13/18 0812     02/06/18 0900  piperacillin-tazobactam (ZOSYN) IVPB 3.375 g  Status:  Discontinued     3.375 g 12.5 mL/hr over 240 Minutes Intravenous Every 8 hours 02/06/18 0805 02/13/18 0812   02/01/18 1005  cefoTEtan in Dextrose 5% (CEFOTAN) IVPB 2 g     2 g Intravenous On call to O.R. 02/01/18 1005 02/01/18 1345      Assessment/Plan: s/p Procedure(s): LAPAROSCOPIC  LOW ANTERIOR RECTOSIGMOID  RESECTION, COLOANAL ANASTOMOSIS;  DIVERTING LOOP ILESTOMY; FLEXIBLE SIGMOIDOSCOPY CYSTOSCOPY WITH RETROGRADE PYELOGRAM, URETEROSCOPY AND URETERAL CATHETERS BILATERAL   -s/p IR drain x 2 -Afebrile, WBC normal; will transition to PO cipro/flagyl today -Continue drains -Ileostomy teaching on empty/record output as well as drain care and recording output. -Ambulate 5x/day -PPx: SQH, SCDs -Dispo: Transitioning to PO abx today; teaching stoma and drain care; possible d/c tomorrow to home assuming home health in place   LOS: 12 days   [facility]

## 2018-02-14 ENCOUNTER — Telehealth: Payer: Self-pay | Admitting: Genetic Counselor

## 2018-02-14 MED ORDER — PSYLLIUM 28 % PO PACK: 1.0000 | PACK | Freq: Two times a day (BID) | ORAL | 1 refills | Status: DC

## 2018-02-14 MED ORDER — SODIUM CHLORIDE FLUSH 0.9 % IV SOLN: 10.0000 mL | INTRAVENOUS | 2 refills | Status: DC

## 2018-02-14 MED ORDER — TRAMADOL HCL 50 MG PO TABS: 50.0000 mg | ORAL_TABLET | Freq: Four times a day (QID) | ORAL | 0 refills | Status: AC | PRN

## 2018-02-14 MED ORDER — LOPERAMIDE HCL 2 MG PO TABS: 2.0000 mg | ORAL_TABLET | Freq: Four times a day (QID) | ORAL | 3 refills | Status: DC

## 2018-02-14 MED ORDER — CIPROFLOXACIN HCL 500 MG PO TABS: 500.0000 mg | ORAL_TABLET | Freq: Two times a day (BID) | ORAL | 0 refills | Status: AC

## 2018-02-14 MED ORDER — METRONIDAZOLE 500 MG PO TABS: 500.0000 mg | ORAL_TABLET | Freq: Three times a day (TID) | ORAL | 0 refills | Status: AC

## 2018-02-14 MED FILL — NORMAL SALINE FLUSH SYRINGE: 0.9 | 30 days supply | Qty: 150 | Fill #0

## 2018-02-14 NOTE — Discharge Summary (Addendum)
Patient ID: Craig [name] MRN: [id number] DOB/AGE: [birth year] 57 y.o.  Admit date: 02/01/2018 Discharge date: 02/14/2018  Discharge Diagnoses Patient Active Problem List   Diagnosis Date Noted  . Malnutrition of moderate degree 02/10/2018  . Family history of cancer   . Rectal cancer ([place]) 09/30/2017  . Rectal bleeding 01/07/2017  . Rectal mass 01/07/2017  . History of diagnostic tests 08/25/2012  . Palpitations   . Anxiety     Procedures 02/01/18 1. Laparoscopic ultra-low anterior resection 2. Coloanal anastomosis 3. Takedown of splenic flexure 4. Diverting loop ileostomy 5. Flexible sigmoidoscopy  -Cystoscopy/stents (urology - Dr. Tresa Moore)   02/10/18 - Dr. Barbie Banner - L transgluteal presacral abscess drain  Hospital Course:  Patient was taken to the OR on the above date for surgery for his rectal cancer. He recovered from this but developed a likely small anastomotic leak from his coloanal anastomosis. This was controlled with the surgical drain in place but he continued to have a smoldering leukocytosis and low grade fevers. CT scans were performed which demonstrated persistence of the collection. IR was consulted for an additional drain palcement which was done uneventfully 02/10/18. His ileostomy was working well by [employer] and his diet was advanced uneventfully soon after surgery. He was transitioned to oral antibiotics 02/13/18 which he tolerated. He remained afebrile, pain free and doing well on oral antibiotics. Home health was arranged for assistance with transition to home, managing stoma and assistance in drain care. He was deemed stable for discharge home.   Allergies as of 02/14/2018   No Known Allergies     Medication List    STOP taking these medications   docusate sodium 100 MG capsule Commonly known as:  COLACE   oxyCODONE 5 MG immediate release tablet Commonly known as:  Oxy IR/ROXICODONE   polyethylene glycol packet Commonly known as:  MIRALAX / GLYCOLAX     TAKE  these medications   Acetaminophen 500 MG coapsule Take 1,000 mg by mouth every 6 (six) hours as needed for moderate pain or headache.   ciprofloxacin 500 MG tablet Commonly known as:  CIPRO Take 1 tablet (500 mg total) by mouth 2 (two) times daily for 14 days.   diphenhydrAMINE 25 MG tablet Commonly known as:  SOMINEX Take 75 mg by mouth at bedtime as needed for sleep.   loperamide 2 MG tablet Commonly known as:  LOPERAMIDE A-D Take 1 tablet (2 mg total) by mouth 4 (four) times daily.   metroNIDAZOLE 500 MG tablet Commonly known as:  FLAGYL Take 1 tablet (500 mg total) by mouth 3 (three) times daily for 14 days.   naphazoline-glycerin 0.012-0.2 % Soln Commonly known as:  CLEAR EYES REDNESS 1-2 drops 4 (four) times daily as needed for eye irritation.   oxymetazoline 0.05 % nasal spray Commonly known as:  AFRIN Place 1 spray into both nostrils daily as needed for congestion.   prochlorperazine 10 MG tablet Commonly known as:  COMPAZINE Take 1 tablet (10 mg total) by mouth every 6 (six) hours as needed for nausea or vomiting.   psyllium 28 % packet Commonly known as:  METAMUCIL SMOOTH TEXTURE Take 1 packet by mouth 2 (two) times daily.   sodium chloride flush 0.9 % Soln injection Place 10 mLs into feeding tube every other day.   traMADol 50 MG tablet Commonly known as:  ULTRAM Take 1 tablet (50 mg total) by mouth every 6 (six) hours as needed for up to 7 days (pain not controlled with tylenol and  ibuprofen).        Follow-up Information    [facility] Follow up.   Specialty:  Home Health Services Why:  nurse to asist with ostomy and drain care Contact information: [address] [place] [address] [phone]        Ileana Roup, MD. Schedule an appointment as soon as possible for a visit in 1 week(s).   Specialty:  General Surgery Contact information: [place] [address] [phone]            Miamor Ayler M. Dema Severin, M.D. [facility]

## 2018-02-14 NOTE — Telephone Encounter (Signed)
Called patient to follow up with him on his tumor testing.  Asked that he CB.  Left CB instructions.

## 2018-02-14 NOTE — Discharge Instructions (Addendum)
POST OP INSTRUCTIONS  1. DIET: As tolerated. Follow a light bland diet the first 24 hours after arrival home, such as soup, liquids, crackers, etc.  Be sure to include lots of fluids daily.  Avoid fast food or heavy meals as your are more likely to get nauseated.  Eat a low fat the next few days after surgery.  2. Take your usually prescribed home medications unless otherwise directed.  3. PAIN CONTROL: a. Pain is best controlled by a usual combination of three different methods TOGETHER: i. Ice/Heat ii. Over the counter pain medication iii. Prescription pain medication b. Most patients will experience some swelling and bruising around the surgical site.  Ice packs or heating pads (30-60 minutes up to 6 times a day) will help. Some people prefer to use ice alone, heat alone, alternating between ice & heat.  Experiment to what works for you.  Swelling and bruising can take several weeks to resolve.   c. It is helpful to take an over-the-counter pain medication regularly for the first few weeks: i. Ibuprofen (Motrin/Advil) - 200mg  tabs - take 3 tabs (600mg ) every 6 hours as needed for pain ii. Acetaminophen (Tylenol) - you may take 650mg  every 6 hours as needed. You can take this with motrin as they act differently on the body. If you are taking a narcotic pain medication that has acetaminophen in it, do not take over the counter tylenol at the same time.  Iii. NOTE: You may take both of these medications together - most patients  find it most helpful when alternating between the two (i.e. Ibuprofen at 6am,  tylenol at 9am, ibuprofen at 12pm ...) d. A  prescription for pain medication should be given to you upon discharge.  Take your pain medication as prescribed if your pain is not adequatly controlled with the over-the-counter pain reliefs mentioned above.  4. Avoid getting constipated.  Between the surgery and the pain medications, it is common to experience some constipation.  Increasing fluid  intake and taking a fiber supplement (such as Metamucil, Citrucel, FiberCon, MiraLax, etc) 1-2 times a day regularly will usually help prevent this problem from occurring.  A mild laxative (prune juice, Milk of Magnesia, MiraLax, etc) should be taken according to package directions if there are no bowel movements after 48 hours.    5. Dressing: Your incisions are covered in Dermabond which is like sterile superglue for the skin. This will come off on it's own. It is waterproof and you may bathe normally starting the day after your surgery in a shower. Avoid baths/pools/lakes/oceans until your wounds have fully healed.  6. Drain Care: Empty and record your drain outputs twice daily. Keep a log of this information and please bring with you to your follow-up appointment. Keep your drain sites dry when bathing (cover with Glad bag and secure with tape). The drain entering your buttock should be flushed with 10cc of sterile saline by your home health team each time they visit your house  7. Ileostomy Care: Empty and record your ileostomy output. Keep track of the output and if >1.2L (1,268mL) in 24hrs, please call our clinic for additional recommendations. THIS IS VERY IMPORTANT. Continue taking daily fiber (metamucil - 1-2 tablespoons twice daily) and Imodium (1 tablet with each meal and an additional tablet at bedtime) as you were in the hospital. If your ileostomy output is <250cc/day, you may decrease your Imodium intake by half.  8. ACTIVITIES as tolerated:   a. Avoid heavy lifting (>10lbs  or 1 gallon of milk) for the next 6 weeks. b. You may resume regular (light) daily activities beginning the next day--such as daily self-care, walking, climbing stairs--gradually increasing activities as tolerated.  If you can walk 30 minutes without difficulty, it is safe to try more intense activity such as jogging, treadmill, bicycling, low-impact aerobics.  c. DO NOT PUSH THROUGH PAIN.  Let pain be your guide: If it  hurts to do something, don't do it. d. Dennis Bast may drive when you are no longer taking prescription pain medication, you can comfortably wear a seatbelt, and you can safely maneuver your car and apply brakes. e. Dennis Bast may have sexual intercourse when it is comfortable.   9. FOLLOW UP in our office a. Please call [facility] at [phone] to set up an appointment to see your surgeon in the office for a follow-up appointment approximately 2 weeks after your surgery. b. Make sure that you call for this appointment the day you arrive home to insure a convenient appointment time.  9. If you have disability or family leave forms that need to be completed, you may have them completed by your primary care physician's office; for return to work instructions, please ask our office staff and they will be happy to assist you in obtaining this documentation   When to call us [phone]: 1. Poor pain control 2. Reactions / problems with new medications (rash/itching, etc)  3. Fever over 101.5 F (38.5 C) 4. Inability to urinate 5. Nausea/vomiting 6. Worsening swelling or bruising 7. Continued bleeding from incision. 8. Increased pain, redness, or drainage from the incision  The clinic staff is available to answer your questions during regular business hours (8:30am-5pm).  Please dont hesitate to call and ask to speak to one of our nurses for clinical concerns.   A surgeon from [facility] is always on call at the hospitals   If you have a medical emergency, go to the nearest emergency room or call 911.  [facility], [place] [address] [place], [place], [place]  [address] MAIN: [phone] FAX: [phone] [email or web address]

## 2018-02-15 LAB — AEROBIC/ANAEROBIC CULTURE W GRAM STAIN (SURGICAL/DEEP WOUND)

## 2018-02-15 LAB — AEROBIC/ANAEROBIC CULTURE (SURGICAL/DEEP WOUND)

## 2018-02-16 ENCOUNTER — Ambulatory Visit: Payer: Medicaid Other | Admitting: Oncology

## 2018-02-22 NOTE — Telephone Encounter (Signed)
LM on VM that I was calling to follow up on the tumor testing.  Asked that he please CB.  Left CB instructions.

## 2018-02-23 ENCOUNTER — Other Ambulatory Visit: Payer: Self-pay | Admitting: Surgery

## 2018-02-23 DIAGNOSIS — C20 Malignant neoplasm of rectum: Secondary | ICD-10-CM

## 2018-02-24 ENCOUNTER — Ambulatory Visit
Admission: RE | Admit: 2018-02-24 | Discharge: 2018-02-24 | Disposition: A | Discharge status: Home / Self Care | Payer: Medicaid Other | Source: Ambulatory Visit | Attending: Surgery | Admitting: Surgery

## 2018-02-24 DIAGNOSIS — C20 Malignant neoplasm of rectum: Secondary | ICD-10-CM

## 2018-02-24 MED ORDER — IOPAMIDOL (ISOVUE-300) INJECTION 61%
100.0000 mL | Freq: Once | INTRAVENOUS | Status: AC | PRN
  Administered 2018-02-24: 100 mL via INTRAVENOUS

## 2018-02-27 NOTE — Telephone Encounter (Signed)
Explained that his tumor testing did not show a IHC loss of the MMR genes.  Therefore the chance that his cancer is due to Lynch syndrome is low.  We discussed that based on his family history and his tumor testing we would not recommend further genetic testing.  However, if he learns what his maternal aunt's cancer was, he should call back because that could change what we are saying.  Patient stated that he understood.

## 2018-03-02 ENCOUNTER — Telehealth: Payer: Self-pay | Admitting: Nurse Practitioner

## 2018-03-02 NOTE — Telephone Encounter (Signed)
Left message for patient regarding appointment for post op on 4/22 per [facility]

## 2018-03-03 ENCOUNTER — Other Ambulatory Visit: Payer: Medicaid Other

## 2018-03-03 MED FILL — NORMAL SALINE FLUSH SYRINGE: 0.9 | 30 days supply | Qty: 150 | Fill #1

## 2018-03-05 NOTE — Progress Notes (Addendum)
[facility]  [phone] [phone]  Clinic Follow up Note   Patient Care Team: Patient, No Pcp Per as PCP - General (General Practice) Yehuda Savannah, MD (Cardiology) 03/06/2018  DIAGNOSIS: Rectal cancer    INTERVAL HISTORY: Mr. Lamoureaux returns for follow-up as scheduled.  He underwent lower anterior rectosigmoid resection, coloanal anastomosis with diverting loop ileostomy on 02/01/2018 per Dr. Dema Severin.  On postop day 4 he developed abdominal pain and low-grade fever, CT showed small loculated fluid collections in RLQ and moderate amount of fluid in presacral space with some associated enteritis.  The surgical drain output was dark and turbid.  IR was consulted for an additional transgluteal presacral abscess drain placement which was done on 02/10/2018. Treated with IV Zosyn and ultimately transitioned to p.o. Cipro and Flagyl.  Discharged on 02/14/2018 with home health.  The percutaneous drain remains in place with little to no output currently, small amount of dried drainage on the dressing.  Plans to see Dr. Dema Severin for drain removal in approximately 5 days.  No recent fever or chills.  The drain itself is mildly sore but denies abdominal or other pain.  Takes tramadol which helps but does not completely resolve. appetite is improving, energy level nearly recovered to normal.  The ostomy output is currently liquid but he reports it is usually thick, 1-2 episodes of diarrhea per week controlled with Imodium.  No blood in stool.  He is here to discuss surgical pathology and indications for further treatment.  REVIEW OF SYSTEMS:   Constitutional: Denies fevers, chills (+) abnormal weight loss, on postop soft diet (+) appetite improving Respiratory: Denies dyspnea or wheezes (+) occasional dry cough Cardiovascular: Denies palpitation, chest discomfort or lower extremity swelling Gastrointestinal:  Denies nausea, vomiting, constipation, abdominal pain, blood in  stool, heartburn or change in bowel habits (+) ileostomy, liquid output currently but usually thick (+) transgluteal drain intact, mildly sore Skin: Denies abnormal skin rashes Neurological:Denies numbness, tingling or new weaknesses Behavioral/Psych: Mood is stable, no new changes  All other systems were reviewed with the patient and are negative.  MEDICAL HISTORY:  Past Medical History:  Diagnosis Date  . Anxiety    Truck driver  . Dysrhythmia    palpitations  . Elevated serum creatinine    1.39 in 06/2012  . Family history of cancer   . History of kidney stones   . Palpitations    + chest pressure; PVCs  . Rectal bleeding 01/07/2017  . Rectal cancer ([place]) 09/30/2017    SURGICAL HISTORY: Past Surgical History:  Procedure Laterality Date  . COLONOSCOPY N/A 09/08/2017   Procedure: COLONOSCOPY;  Surgeon: Rogene Houston, MD;  Location: [facility];  Service: Endoscopy;  Laterality: N/A;  . CYSTOSCOPY WITH RETROGRADE PYELOGRAM, URETEROSCOPY AND STENT PLACEMENT Bilateral 02/01/2018   Procedure: CYSTOSCOPY WITH RETROGRADE PYELOGRAM, URETEROSCOPY AND URETERAL CATHETERS BILATERAL;  Surgeon: Alexis Frock, MD;  Location: [facility] ORS;  Service: Urology;  Laterality: Bilateral;  . LAPAROSCOPIC LOW ANTERIOR RESECTION N/A 02/01/2018   Procedure: LAPAROSCOPIC  LOW ANTERIOR RECTOSIGMOID RESECTION, COLOANAL ANASTOMOSIS;  DIVERTING LOOP ILESTOMY; FLEXIBLE SIGMOIDOSCOPY;  Surgeon: Ileana Roup, MD;  Location: [facility] ORS;  Service: General;  Laterality: N/A;    I have reviewed the social history and family history with the patient and they are unchanged from previous note.  ALLERGIES:  has No Known Allergies.  MEDICATIONS:  Current Outpatient Medications  Medication Sig Dispense Refill  . Acetaminophen 500 MG coapsule Take 1,000 mg by mouth  every 6 (six) hours as needed for moderate pain or headache.     . diphenhydrAMINE (SOMINEX) 25 MG tablet Take 75 mg by mouth at bedtime as needed for  sleep.    . naphazoline-glycerin (CLEAR EYES REDNESS) 0.012-0.2 % SOLN 1-2 drops 4 (four) times daily as needed for eye irritation.    . prochlorperazine (COMPAZINE) 10 MG tablet Take 1 tablet (10 mg total) by mouth every 6 (six) hours as needed for nausea or vomiting. 30 tablet 1  . psyllium (METAMUCIL SMOOTH TEXTURE) 28 % packet Take 1 packet by mouth 2 (two) times daily. 180 packet 1  . loperamide (LOPERAMIDE A-D) 2 MG tablet Take 1 tablet (2 mg total) by mouth 4 (four) times daily. (Patient not taking: Reported on 03/06/2018) 120 tablet 3  . oxymetazoline (AFRIN) 0.05 % nasal spray Place 1 spray into both nostrils daily as needed for congestion.    . sodium chloride flush 0.9 % SOLN injection Place 10 mLs into feeding tube every other day. (Patient not taking: Reported on 03/06/2018) 30 Syringe 2  . traMADol (ULTRAM) 50 MG tablet Take 1 tablet by mouth every 6 (six) hours as needed.  0   No current facility-administered medications for this visit.     PHYSICAL EXAMINATION: ECOG PERFORMANCE STATUS: 2 - Symptomatic, <50% confined to bed  Vitals:   03/06/18 0924  BP: 111/70  Pulse: 73  Resp: 18  Temp: 98.4 F (36.9 C)  SpO2: 99%   Filed Weights   03/06/18 0924  Weight: 163 lb 11.2 oz (74.3 kg)    GENERAL:alert, no distress and comfortable SKIN: skin color, texture, turgor are normal, no rashes or significant lesions EYES: normal, Conjunctiva are pink and non-injected, sclera clear OROPHARYNX:no exudate, no erythema and lips, buccal mucosa, and tongue normal  LYMPH:  no palpable cervical or supraclavicular lymphadenopathy LUNGS: clear to auscultation with normal breathing effort HEART: regular rate & rhythm and no murmurs and no lower extremity edema ABDOMEN:abdomen soft, non-tender and normal bowel sounds. (+) Low anterior surgical incision closed and healing well, no erythema or drainage. (+) ileostomy, right abdomen (+) transgluteal percutaneous abscess drain, dressing with  minimal drainage and intact Musculoskeletal:no cyanosis of digits and no clubbing  NEURO: alert & oriented x 3 with fluent speech, no focal motor/sensory deficits  LABORATORY DATA:  I have reviewed the data as listed CBC Latest Ref Rng & Units 03/06/2018 02/13/2018 02/12/2018  WBC 4.0 - 10.3 K/uL 3.2(L) 9.4 11.8(H)  Hemoglobin 13.0 - 17.1 g/dL 13.0 12.2(L) 13.0  Hematocrit 38.4 - 49.9 % 39.5 35.4(L) 38.3(L)  Platelets 140 - 400 K/uL 238 614(H) 598(H)     CMP Latest Ref Rng & Units 03/06/2018 02/11/2018 02/10/2018  Glucose 70 - 140 mg/dL 96 120(H) 111(H)  BUN 7 - 26 mg/dL 11 12 9   Creatinine 0.70 - 1.30 mg/dL 0.99 1.14 1.10  Sodium 136 - 145 mmol/L 140 138 140  Potassium 3.5 - 5.1 mmol/L 4.0 4.2 4.2  Chloride 98 - 109 mmol/L 106 103 105  CO2 22 - 29 mmol/L 25 25 26   Calcium 8.4 - 10.4 mg/dL 9.6 8.8(L) 8.8(L)  Total Protein 6.4 - 8.3 g/dL 7.7 - -  Total Bilirubin 0.2 - 1.2 mg/dL 0.2 - -  Alkaline Phos 40 - 150 U/L 112 - -  AST 5 - 34 U/L 14 - -  ALT 0 - 55 U/L 18 - -    RADIOGRAPHIC STUDIES: I have personally reviewed the radiological images as listed and agreed  with the findings in the report. No results found.   Assessment/Plan: 1. Rectal cancer ? Colonoscopy 09/08/2017, rectal mass at 5 cm from the anal verge, biopsy confirmed invasive adenocarcinoma ? Staging CTs 09/15/2017, rectal mass and perirectal lymphadenopathy, indeterminate right upper lobe nodule ? Elevated CEA ? MRI pelvis 10/07/2017-5.3 cm circumferential lower rectal mass with perirectal extension along the inferior mesenteric vein;T3, N1; distance from tumor to the anal sphincter is 5 cm. ? Initiation of radiation and concurrent Xeloda chemotherapy 10/12/2017; completion of radiation/Xeloda 11/23/2017. ? Surgery 02/01/18 per Dr. Nadeen Landau; laparoscopic lower anterior rectosigmoid resection, coloanal anastomosis, diverting loop ileostomy, flexible sigmoidoscopy; ypT3, pN0 ? Developed low-grade fever and  abdominal pain on POD 4, CT [facility] on 02/06/18 and 02/09/18 shows small loculated fluid collections in RLQ and presacral space, consulted IR for transgluteal presacral abscess drain, treated with IV zosyn then transitioned to po cipro and flagyl  2.Pain and bleeding secondary to #1.Resolved.  3.Skin breakdown at the gluteal fold secondary to radiation.  Resolved.  Disposition:  Mr. Nanna appears stable. He underwent LAR with diverting ileostomy on 02/07/70, complicated by abscess s/p IR percutaneous drain. He plans to f/u with Dr. Dema Severin and have drain removed this week. He has recovered well from surgery. Final surgical path reveals isolated foci of residual adenocarcinoma involving rectosigmoid colon, ypT3, N0.  There was near complete treatment response.  Dr. Benay Spice reviewed the pathology results and recommends adjuvant chemotherapy, he discussed Xeloda alone vs CAPOX vs FOLFOX.  Dr. Benay Spice reviewed clinical evidence of slightly increased survival benefit with oxaliplatin.  Patient prefers Xeloda alone, he understands there may be a slight benefit to receiving oxaliplatin in addition to Xeloda but he declined. We discussed he will receive higher dose than he received with neoadjuvant chemoRT and thus may experience more side effects such as diarrhea and hand-foot syndrome. We reviewed symptom management.  CBC and CMP reviewed, adequate to proceed with adjuvant Xeloda BID x14 days on, 7 days off of 21 day cycle; plan to start in 1 week. Will f/u after he completes 2 weeks of therapy to monitor side effects before he begins cycle 2.   Orders Placed This Encounter  Procedures  . CBC with Differential (Cancer Center Only)    Standing Status:   Future    Number of Occurrences:   1    Standing Expiration Date:   03/07/2019  . CMP ([facility] only)    Standing Status:   Future    Number of Occurrences:   1    Standing Expiration Date:   03/07/2019  . CBC with Differential (Cancer Center Only)     Standing Status:   Future    Standing Expiration Date:   03/07/2019  . CMP ([facility] only)    Standing Status:   Future    Standing Expiration Date:   03/07/2019   All questions were answered. The patient knows to call the clinic with any problems, questions or concerns. No barriers to learning was detected. I spent 20 minutes counseling the patient face to face. The total time spent in the appointment was 25 minutes and more than 50% was on counseling and review of test results     Alla Feeling, NP 03/06/18   This was a shared visit with Cira Rue.  Mr. Pease was interviewed and examined.  He is recovering from the anterior resection/ileostomy.  He had an excellent response to the neoadjuvant chemotherapy/radiation with minimal remaining viable tumor.  I reviewed the  pathology report and discussed adjuvant treatment options with Mr. Livsey.  He had clinical stage III disease on preoperative staging.  I recommend adjuvant 5-fluorouracil and oxaliplatin based therapy.  We reviewed FOLFOX and CAPOX chemotherapy.  We discussed the expected increase in the cure rate with the addition of oxaliplatin to 5-fluorouracil.  Mr. Eltringham declines oxaliplatin.  He agrees to complete a course of adjuvant capecitabine.  We reviewed potential toxicities associated with capecitabine including the chance for diarrhea and hand/foot syndrome.  He will discontinue capecitabine and contact us if has increased loose stool output from the ileostomy.  The plan is to begin adjuvant therapy 03/13/2018.  He will complete 5 cycles of adjuvant capecitabine.  25 minutes were spent with the patient today.  The majority of the time was used for counseling and coordination of care.  Julieanne Manson, MD

## 2018-03-06 ENCOUNTER — Telehealth: Payer: Self-pay | Admitting: Nurse Practitioner

## 2018-03-06 ENCOUNTER — Inpatient Hospital Stay: Payer: Medicaid Other | Attending: Oncology | Admitting: Nurse Practitioner

## 2018-03-06 ENCOUNTER — Inpatient Hospital Stay: Payer: Medicaid Other

## 2018-03-06 ENCOUNTER — Encounter: Payer: Self-pay | Admitting: Nurse Practitioner

## 2018-03-06 VITALS — BP 111/70 | HR 73 | Temp 98.4°F | Resp 18 | Ht 69.0 in | Wt 163.7 lb

## 2018-03-06 DIAGNOSIS — C20 Malignant neoplasm of rectum: Secondary | ICD-10-CM | POA: Diagnosis not present

## 2018-03-06 DIAGNOSIS — Z923 Personal history of irradiation: Secondary | ICD-10-CM | POA: Diagnosis not present

## 2018-03-06 DIAGNOSIS — Z9221 Personal history of antineoplastic chemotherapy: Secondary | ICD-10-CM | POA: Insufficient documentation

## 2018-03-06 DIAGNOSIS — Z932 Ileostomy status: Secondary | ICD-10-CM | POA: Diagnosis not present

## 2018-03-06 LAB — CBC WITH DIFFERENTIAL (CANCER CENTER ONLY)
BASOS PCT: 1 %
Basophils Absolute: 0 10*3/uL (ref 0.0–0.1)
EOS ABS: 0.1 10*3/uL (ref 0.0–0.5)
EOS PCT: 2 %
HCT: 39.5 % (ref 38.4–49.9)
Hemoglobin: 13 g/dL (ref 13.0–17.1)
LYMPHS ABS: 0.3 10*3/uL — AB (ref 0.9–3.3)
Lymphocytes Relative: 10 %
MCH: 29.1 pg (ref 27.2–33.4)
MCHC: 32.8 g/dL (ref 32.0–36.0)
MCV: 88.6 fL (ref 79.3–98.0)
Monocytes Absolute: 0.4 10*3/uL (ref 0.1–0.9)
Monocytes Relative: 12 %
Neutro Abs: 2.4 10*3/uL (ref 1.5–6.5)
Neutrophils Relative %: 75 %
PLATELETS: 238 10*3/uL (ref 140–400)
RBC: 4.46 MIL/uL (ref 4.20–5.82)
RDW: 14.4 % (ref 11.0–14.6)
WBC: 3.2 10*3/uL — AB (ref 4.0–10.3)

## 2018-03-06 LAB — CMP (CANCER CENTER ONLY)
ALK PHOS: 112 U/L (ref 40–150)
ALT: 18 U/L (ref 0–55)
AST: 14 U/L (ref 5–34)
Albumin: 2.8 g/dL — ABNORMAL LOW (ref 3.5–5.0)
Anion gap: 9 (ref 3–11)
BUN: 11 mg/dL (ref 7–26)
CALCIUM: 9.6 mg/dL (ref 8.4–10.4)
CO2: 25 mmol/L (ref 22–29)
CREATININE: 0.99 mg/dL (ref 0.70–1.30)
Chloride: 106 mmol/L (ref 98–109)
GFR, Est AFR Am: >60 mL/min (ref >60)
Glucose, Bld: 96 mg/dL (ref 70–140)
Potassium: 4 mmol/L (ref 3.5–5.1)
Sodium: 140 mmol/L (ref 136–145)
TOTAL PROTEIN: 7.7 g/dL (ref 6.4–8.3)
Total Bilirubin: 0.2 mg/dL (ref 0.2–1.2)

## 2018-03-06 NOTE — Telephone Encounter (Signed)
Scheduled appt per 4/22 los - Gave patient AVS and calender per los.  

## 2018-03-08 ENCOUNTER — Telehealth: Payer: Self-pay | Admitting: Pharmacist

## 2018-03-08 DIAGNOSIS — C20 Malignant neoplasm of rectum: Secondary | ICD-10-CM

## 2018-03-08 MED ORDER — XELODA 500 MG PO TABS: ORAL_TABLET | ORAL | 0 refills | Status: DC

## 2018-03-08 NOTE — Telephone Encounter (Signed)
Oral Oncology Pharmacist Encounter  Received new prescription for Xeloda (capecitabine) for the adjuvant  treatment of rectal cancer, planned duration 5 cycles.  Patient received his Xeloda with radiation therapy for neoadjuvant treatment November 2018 through January 2019. Daily dose of Xeloda is the same as during neoadjuvant treatment, patient will be counseled about change in frequency.  Labs from 03/06/18 assessed, OK for treatment.  Current medication list in Epic reviewed, no DDIs with Xeloda identified.  Prescription has been e-scribed to the [facility] for benefits analysis and approval. Patient now with Medicaid prescription and insurance coverage.  Oral Oncology Clinic will continue to follow for initial counseling and start date.  Johny Drilling, PharmD, BCPS, BCOP 03/08/2018 1:42 PM Oral Oncology Clinic [phone]

## 2018-03-08 NOTE — Telephone Encounter (Signed)
Oral Chemotherapy Pharmacist Encounter   I spoke with patient for overview of: Xeloda (capecitabine) restart. Xeloda will be used as monotherapy for adjuvant treatment of rectal cancer Pt is s/p LAR with diverting ileostomy on 02/01/18 Pt has declined used of oxaliplatin with Xeloda Plan is for 5 cycles    Counseled patient on administration, dosing, side effects, monitoring, drug-food interactions, safe handling, storage, and disposal.  Patient will take Xeloda 500mg  tablets, 4 tablets (2000mg ) by mouth in AM and 3 tabs (1500mg ) by mouth in PM, within 30 minutes of finishing meals, on days 1-14 of each 21 day cycle.   Xeloda start date: 03/13/18  Side effects of Xeloda include but not limited to: fatigue, decreased blood counts, GI upset, diarrhea, and hand-foot syndrome. Patient has loperamide at home and will call the office if diarrhea develops.    Patient did notice darkening of skin on hands with previous Xeloda use. Patient states this went away immediately after discontinuation of the Xeloda. Patient counseled that he may experience skin changes again, and will keep the office posted.  Reviewed with patient importance of keeping a medication schedule and plan for any missed doses.  Mr. Sexson voiced understanding and appreciation.   All questions answered. Medication reconciliation performed and medication/allergy list updated.  Prescription will be made ready at the [facility] long outpatient pharmacy for patient to pick up. Patient states he will pick up his Xeloda by the end of the day on Friday (03/10/2018).  When patient was on Xeloda previously he was uninsured.  Patient now with active Medicaid prescription and insurance coverage. Copayment for each fill (3-week supply) will be $3.  Patient states this is affordable at this time.  Confirm with patient follow-up office visit on 03/29/2018.  Patient knows to call the office with questions or concerns. Oral Oncology Clinic will  continue to follow.  Thank you,  Johny Drilling, PharmD, BCPS, BCOP 03/08/2018   2:10 PM Oral Oncology Clinic [phone]

## 2018-03-16 ENCOUNTER — Other Ambulatory Visit: Payer: Self-pay | Admitting: Surgery

## 2018-03-16 DIAGNOSIS — C20 Malignant neoplasm of rectum: Secondary | ICD-10-CM

## 2018-03-20 ENCOUNTER — Other Ambulatory Visit: Payer: Self-pay | Admitting: Pharmacist

## 2018-03-24 ENCOUNTER — Ambulatory Visit
Admission: RE | Admit: 2018-03-24 | Discharge: 2018-03-24 | Disposition: A | Discharge status: Home / Self Care | Payer: Medicaid Other | Source: Ambulatory Visit | Attending: Surgery | Admitting: Surgery

## 2018-03-24 DIAGNOSIS — C20 Malignant neoplasm of rectum: Secondary | ICD-10-CM

## 2018-03-24 MED ORDER — IOPAMIDOL (ISOVUE-300) INJECTION 61%
100.0000 mL | Freq: Once | INTRAVENOUS | Status: AC | PRN
  Administered 2018-03-24: 100 mL via INTRAVENOUS

## 2018-03-29 ENCOUNTER — Inpatient Hospital Stay: Payer: Medicaid Other | Attending: Oncology | Admitting: Nurse Practitioner

## 2018-03-29 ENCOUNTER — Other Ambulatory Visit: Payer: Self-pay | Admitting: Surgery

## 2018-03-29 ENCOUNTER — Inpatient Hospital Stay: Payer: Medicaid Other

## 2018-03-29 DIAGNOSIS — L0291 Cutaneous abscess, unspecified: Secondary | ICD-10-CM

## 2018-03-29 DIAGNOSIS — Z85048 Personal history of other malignant neoplasm of rectum, rectosigmoid junction, and anus: Secondary | ICD-10-CM | POA: Insufficient documentation

## 2018-03-30 ENCOUNTER — Telehealth: Payer: Self-pay | Admitting: Oncology

## 2018-03-30 NOTE — Telephone Encounter (Signed)
Scheduled appt per 5/15 sch msg - left vm for pt re appts that were added.

## 2018-04-06 ENCOUNTER — Inpatient Hospital Stay: Payer: Medicaid Other

## 2018-04-06 ENCOUNTER — Inpatient Hospital Stay: Payer: Medicaid Other | Admitting: Nurse Practitioner

## 2018-04-11 ENCOUNTER — Encounter: Payer: Self-pay | Admitting: Nurse Practitioner

## 2018-04-11 ENCOUNTER — Inpatient Hospital Stay (HOSPITAL_BASED_OUTPATIENT_CLINIC_OR_DEPARTMENT_OTHER): Payer: Medicaid Other | Admitting: Nurse Practitioner

## 2018-04-11 ENCOUNTER — Inpatient Hospital Stay: Payer: Medicaid Other

## 2018-04-11 ENCOUNTER — Other Ambulatory Visit: Payer: Self-pay

## 2018-04-11 ENCOUNTER — Telehealth: Payer: Self-pay | Admitting: Nurse Practitioner

## 2018-04-11 VITALS — BP 117/90 | HR 75 | Temp 98.3°F | Resp 18 | Ht 69.0 in | Wt 166.1 lb

## 2018-04-11 DIAGNOSIS — C20 Malignant neoplasm of rectum: Secondary | ICD-10-CM

## 2018-04-11 DIAGNOSIS — Z85048 Personal history of other malignant neoplasm of rectum, rectosigmoid junction, and anus: Secondary | ICD-10-CM

## 2018-04-11 LAB — CBC WITH DIFFERENTIAL (CANCER CENTER ONLY)
BASOS ABS: 0 10*3/uL (ref 0.0–0.1)
Basophils Relative: 1 %
EOS ABS: 0.1 10*3/uL (ref 0.0–0.5)
EOS PCT: 4 %
HCT: 42.7 % (ref 38.4–49.9)
Hemoglobin: 14.4 g/dL (ref 13.0–17.1)
Lymphocytes Relative: 15 %
Lymphs Abs: 0.5 10*3/uL — ABNORMAL LOW (ref 0.9–3.3)
MCH: 28.9 pg (ref 27.2–33.4)
MCHC: 33.7 g/dL (ref 32.0–36.0)
MCV: 85.7 fL (ref 79.3–98.0)
MONO ABS: 0.4 10*3/uL (ref 0.1–0.9)
Monocytes Relative: 13 %
Neutro Abs: 2.2 10*3/uL (ref 1.5–6.5)
Neutrophils Relative %: 67 %
PLATELETS: 203 10*3/uL (ref 140–400)
RBC: 4.98 MIL/uL (ref 4.20–5.82)
RDW: 14.8 % — AB (ref 11.0–14.6)
WBC Count: 3.2 10*3/uL — ABNORMAL LOW (ref 4.0–10.3)

## 2018-04-11 LAB — COMPREHENSIVE METABOLIC PANEL
ALT: 18 U/L (ref 17–63)
ANION GAP: 10 (ref 5–15)
AST: 16 U/L (ref 15–41)
Albumin: 3.6 g/dL (ref 3.5–5.0)
Alkaline Phosphatase: 90 U/L (ref 38–126)
BUN: 14 mg/dL (ref 6–20)
CHLORIDE: 105 mmol/L (ref 101–111)
CO2: 23 mmol/L (ref 22–32)
Calcium: 9.3 mg/dL (ref 8.9–10.3)
Creatinine, Ser: 0.98 mg/dL (ref 0.61–1.24)
GFR calc Af Amer: >60 mL/min (ref >60)
GFR calc non Af Amer: >60 mL/min (ref >60)
GLUCOSE: 94 mg/dL (ref 65–99)
POTASSIUM: 3.9 mmol/L (ref 3.5–5.1)
Sodium: 138 mmol/L (ref 135–145)
Total Bilirubin: 0.5 mg/dL (ref 0.3–1.2)
Total Protein: 7.8 g/dL (ref 6.5–8.1)

## 2018-04-11 MED FILL — XELODA 500 MG TABLET: 500 | 21 days supply | Qty: 98 | Fill #0

## 2018-04-11 NOTE — Telephone Encounter (Signed)
Appointment scheduled AVS/Calendar printed per 5/28 los

## 2018-04-11 NOTE — Progress Notes (Signed)
  [place] OFFICE PROGRESS NOTE   Diagnosis: Rectal cancer  INTERVAL HISTORY:   Craig [name] returns as scheduled.  He reports the ostomy is functioning well.  He estimates emptying the bag 3-4 times a day.  He takes Imodium as needed.  He has a good appetite.  He reports intermittent pain at the right low abdomen to the testicles.  He describes the pain as "aching".  The pain occurs maybe every other day.  He reports having a urinalysis recently which was negative for signs of infection.  He has "minor" pain at the rectum.  He thinks he is supposed to have a follow-up CT scan this week.  Objective:  Vital signs in last 24 hours:  Blood pressure 117/90, pulse 75, temperature 98.3 F (36.8 C), temperature source Oral, resp. rate 18, height 5\' 9"  (1.753 m), weight 166 lb 1.6 oz (75.3 kg), SpO2 100 %.    HEENT: No thrush or ulcers. Resp: Lungs clear bilaterally.  Regular rate and rhythm. Cardio: Regular rate and rhythm. GI: Abdomen soft and nontender.  No hepatomegaly.  Right abdomen ileostomy. Vascular: No leg edema.   Lab Results:  Lab Results  Component Value Date   WBC 3.2 (L) 04/11/2018   HGB 14.4 04/11/2018   HCT 42.7 04/11/2018   MCV 85.7 04/11/2018   PLT 203 04/11/2018   NEUTROABS 2.2 04/11/2018    Imaging:  No results found.  Medications: I have reviewed the patient's current medications.  Assessment/Plan: 1. Rectal cancer ? Colonoscopy 09/08/2017, rectal mass at 5 cm from the anal verge, biopsy confirmed invasive adenocarcinoma ? Staging CTs 09/15/2017, rectal mass and perirectal lymphadenopathy, indeterminate right upper lobe nodule ? Elevated CEA ? MRI pelvis 10/07/2017-5.3 cm circumferential lower rectal mass with perirectal extension along the inferior mesenteric vein;T3, N1; distance from tumor to the anal sphincter is 5 cm. ? Initiation of radiation and concurrent Xeloda chemotherapy 10/12/2017;completion of radiation/Xeloda  11/23/2017. ? Surgery 02/01/18 per Dr. Nadeen Landau; laparoscopic lower anterior rectosigmoid resection, coloanal anastomosis, diverting loop ileostomy, flexible sigmoidoscopy; ypT3, pN0 ? Developed low-grade fever and abdominal pain on POD 4, CT AP on 02/06/18 and 02/09/18 shows small loculated fluid collections in RLQ and presacral space, consulted IR for transgluteal presacral abscess drain, treated with IV zosyn then transitioned to po cipro and flagyl ? Follow-up CT 03/24/2018- 2.2 x 4.9 x 4.0 cm thick-walled presacral gas and fluid collection/abscess, minimally increased status post removal of surgical drain.  No evidence of recurrent or metastatic disease.  2.Pain and bleeding secondary to #1.Resolved.  3.Skin breakdown at the gluteal fold secondary to radiation. Resolved.    Disposition: Craig [name] appears stable.  He has not started Xeloda.  We will contact Dr. Orest Dikes office today for clearance from a surgical perspective to begin adjuvant Xeloda.    He will return for lab and follow-up on 04/27/2018.  He will contact the office in the interim with any problems.  Plan reviewed with Dr. Benay Spice.  15 minutes were spent face-to-face at today's visit with the majority of that time involved in counseling/coordination of care.   Ned Card ANP/GNP-BC   04/11/2018  9:50 AM

## 2018-04-12 ENCOUNTER — Telehealth: Payer: Self-pay | Admitting: Emergency Medicine

## 2018-04-12 NOTE — Telephone Encounter (Signed)
Called pt to inform him ok per Dr.Sherrill to start xelodo today. VM left for pt to call back with any questions.

## 2018-04-25 ENCOUNTER — Other Ambulatory Visit: Payer: Self-pay | Admitting: Oncology

## 2018-04-25 DIAGNOSIS — C20 Malignant neoplasm of rectum: Secondary | ICD-10-CM

## 2018-04-27 ENCOUNTER — Encounter: Payer: Self-pay | Admitting: Nurse Practitioner

## 2018-04-27 ENCOUNTER — Telehealth: Payer: Self-pay

## 2018-04-27 ENCOUNTER — Inpatient Hospital Stay: Payer: Medicaid Other | Admitting: Nurse Practitioner

## 2018-04-27 ENCOUNTER — Inpatient Hospital Stay: Payer: Medicaid Other

## 2018-04-27 ENCOUNTER — Inpatient Hospital Stay: Payer: Medicaid Other | Attending: Oncology | Admitting: Nurse Practitioner

## 2018-04-27 VITALS — BP 118/82 | HR 96 | Temp 98.6°F | Resp 18 | Ht 69.0 in | Wt 169.0 lb

## 2018-04-27 DIAGNOSIS — C20 Malignant neoplasm of rectum: Secondary | ICD-10-CM

## 2018-04-27 LAB — CBC WITH DIFFERENTIAL (CANCER CENTER ONLY)
BASOS PCT: 0 %
Basophils Absolute: 0 10*3/uL (ref 0.0–0.1)
EOS ABS: 0.1 10*3/uL (ref 0.0–0.5)
Eosinophils Relative: 3 %
HCT: 42.8 % (ref 38.4–49.9)
Hemoglobin: 14.7 g/dL (ref 13.0–17.1)
Lymphocytes Relative: 16 %
Lymphs Abs: 0.5 10*3/uL — ABNORMAL LOW (ref 0.9–3.3)
MCH: 29.3 pg (ref 27.2–33.4)
MCHC: 34.3 g/dL (ref 32.0–36.0)
MCV: 85.4 fL (ref 79.3–98.0)
MONO ABS: 0.4 10*3/uL (ref 0.1–0.9)
MONOS PCT: 12 %
Neutro Abs: 2.1 10*3/uL (ref 1.5–6.5)
Neutrophils Relative %: 69 %
PLATELETS: 226 10*3/uL (ref 140–400)
RBC: 5.01 MIL/uL (ref 4.20–5.82)
RDW: 15.6 % — AB (ref 11.0–14.6)
WBC Count: 3.1 10*3/uL — ABNORMAL LOW (ref 4.0–10.3)

## 2018-04-27 LAB — CMP (CANCER CENTER ONLY)
ALBUMIN: 3.9 g/dL (ref 3.5–5.0)
ALT: 34 U/L (ref 0–55)
ANION GAP: 9 (ref 3–11)
AST: 17 U/L (ref 5–34)
Alkaline Phosphatase: 141 U/L (ref 40–150)
BUN: 16 mg/dL (ref 7–26)
CHLORIDE: 106 mmol/L (ref 98–109)
CO2: 23 mmol/L (ref 22–29)
Calcium: 9.8 mg/dL (ref 8.4–10.4)
Creatinine: 1.27 mg/dL (ref 0.70–1.30)
GFR, Est AFR Am: >60 mL/min (ref >60)
GFR, Estimated: >60 mL/min (ref >60)
GLUCOSE: 111 mg/dL (ref 70–140)
Potassium: 3.7 mmol/L (ref 3.5–5.1)
SODIUM: 138 mmol/L (ref 136–145)
TOTAL PROTEIN: 8 g/dL (ref 6.4–8.3)
Total Bilirubin: 0.4 mg/dL (ref 0.2–1.2)

## 2018-04-27 MED ORDER — XELODA 500 MG PO TABS: ORAL_TABLET | ORAL | 0 refills | Status: DC

## 2018-04-27 MED FILL — XELODA 500 MG TABLET: 500 | 21 days supply | Qty: 98 | Fill #0

## 2018-04-27 NOTE — Telephone Encounter (Signed)
Printed avs and calender of upcoming appointment. Per 6/13 los 

## 2018-04-27 NOTE — Progress Notes (Signed)
  [place] OFFICE PROGRESS NOTE   Diagnosis: Rectal cancer  INTERVAL HISTORY:   [name] returns as scheduled.  He completed cycle 1 adjuvant Xeloda beginning 04/12/2018.  He denies nausea/vomiting.  No mouth sores.  No diarrhea.  No hand or foot pain or redness.  Objective:  Vital signs in last 24 hours:  Blood pressure 118/82, pulse 96, temperature 98.6 F (37 C), temperature source Oral, resp. rate 18, height 5\' 9"  (1.753 m), weight 169 lb (76.7 kg), SpO2 100 %.    HEENT: No thrush or ulcers. Resp: Lungs clear bilaterally. Cardio: Regular rate and rhythm. GI: Abdomen soft and nontender.  No hepatomegaly.  Right abdomen ileostomy. Vascular: No leg edema. Skin: Palms with mild hyperpigmentation.  No erythema.   Lab Results:  Lab Results  Component Value Date   WBC 3.1 (L) 04/27/2018   HGB 14.7 04/27/2018   HCT 42.8 04/27/2018   MCV 85.4 04/27/2018   PLT 226 04/27/2018   NEUTROABS 2.1 04/27/2018    Imaging:  No results found.  Medications: I have reviewed the patient's current medications.  Assessment/Plan: 1. Rectal cancer ? Colonoscopy 09/08/2017, rectal mass at 5 cm from the anal verge, biopsy confirmed invasive adenocarcinoma ? Staging CTs 09/15/2017, rectal mass and perirectal lymphadenopathy, indeterminate right upper lobe nodule ? Elevated CEA ? MRI pelvis 10/07/2017-5.3 cm circumferential lower rectal mass with perirectal extension along the inferior mesenteric vein;T3, N1; distance from tumor to the anal sphincter is 5 cm. ? Initiation of radiation and concurrent Xeloda chemotherapy 10/12/2017;completion of radiation/Xeloda 11/23/2017. ? Surgery 02/01/18 per Dr. Nadeen Landau; laparoscopic lower anterior rectosigmoid resection, coloanal anastomosis, diverting loop ileostomy, flexible sigmoidoscopy; ypT3, pN0 ? Developed low-grade fever and abdominal pain on POD 4, CT AP on 02/06/18 and 02/09/18 shows small loculated fluid collections in  RLQ and presacral space, consulted IR for transgluteal presacral abscess drain, treated with IV zosyn then transitioned to po cipro and flagyl ? Follow-up CT 03/24/2018- 2.2 x 4.9 x 4.0 cm thick-walled presacral gas and fluid collection/abscess, minimally increased status post removal of surgical drain.  No evidence of recurrent or metastatic disease. ? Cycle 1 adjuvant Xeloda 04/12/2018  2.Pain and bleeding secondary to #1.Resolved.  3.Skin breakdown at the gluteal fold secondary to radiation. Resolved.    Disposition: Mr. Mohrmann appears stable.  He has completed 1 cycle of adjuvant Xeloda.  He tolerated the chemotherapy well.  Plan to proceed with cycle 2 beginning 05/03/2018.  He will return for a follow-up visit in 3 weeks.  He will contact the office in the interim with any problems.  Plan reviewed with Dr. Benay Spice.    Ned Card ANP/GNP-BC   04/27/2018  3:32 PM

## 2018-05-05 ENCOUNTER — Ambulatory Visit
Admission: RE | Admit: 2018-05-05 | Discharge: 2018-05-05 | Disposition: A | Discharge status: Home / Self Care | Payer: Medicaid Other | Source: Ambulatory Visit | Attending: Surgery | Admitting: Surgery

## 2018-05-05 DIAGNOSIS — L0291 Cutaneous abscess, unspecified: Secondary | ICD-10-CM

## 2018-05-05 MED ORDER — IOPAMIDOL (ISOVUE-300) INJECTION 61%
100.0000 mL | Freq: Once | INTRAVENOUS | Status: AC | PRN
  Administered 2018-05-05: 100 mL via INTRAVENOUS

## 2018-05-17 ENCOUNTER — Inpatient Hospital Stay: Payer: Medicaid Other | Attending: Oncology | Admitting: Oncology

## 2018-05-17 ENCOUNTER — Inpatient Hospital Stay: Payer: Medicaid Other

## 2018-05-17 DIAGNOSIS — Z79899 Other long term (current) drug therapy: Secondary | ICD-10-CM | POA: Insufficient documentation

## 2018-05-17 DIAGNOSIS — R944 Abnormal results of kidney function studies: Secondary | ICD-10-CM | POA: Insufficient documentation

## 2018-05-17 DIAGNOSIS — C20 Malignant neoplasm of rectum: Secondary | ICD-10-CM | POA: Insufficient documentation

## 2018-05-17 DIAGNOSIS — D709 Neutropenia, unspecified: Secondary | ICD-10-CM | POA: Insufficient documentation

## 2018-05-22 ENCOUNTER — Telehealth: Payer: Self-pay | Admitting: Pharmacist

## 2018-05-22 ENCOUNTER — Other Ambulatory Visit: Payer: Self-pay | Admitting: Oncology

## 2018-05-22 ENCOUNTER — Ambulatory Visit: Payer: Medicaid Other | Admitting: Oncology

## 2018-05-22 DIAGNOSIS — C20 Malignant neoplasm of rectum: Secondary | ICD-10-CM

## 2018-05-22 NOTE — Telephone Encounter (Signed)
Scheduled patient on Friday at 10:45 for lab and 11:15 with Dr. Benay Spice. LM with appt date and time

## 2018-05-22 NOTE — Telephone Encounter (Signed)
Oral Oncology Pharmacist Encounter  Received notification from the [facility] that they had spoken with patient and he stated that he was supposed to have started cycle 2 of Xeloda on Wednesday, 05/17/2018.  Adjuvant Xeloda start date: 04/12/18  [place] and [place] not [place] home until Saturday, 05/27/2018.  Patient failed to keep his appointments scheduled in the office on 05/17/2018. MD will be alerted that pharmacy has spoken with patient. Patient will need to be rescheduled for his appointments that he missed on 05/17/2018.  Xeloda will ship from the pharmacy on Thursday for delivery to patient's home on Friday. Patient will likely start cycle 2 of Xeloda on 7/14 or 7/15.  Johny Drilling, PharmD, BCPS, BCOP  05/22/2018 11:49 AM Oral Oncology Clinic [phone]

## 2018-05-23 NOTE — Telephone Encounter (Signed)
Reschedule appts for Monday at 8. LM with corrected date and time. Xeloda refill sent with instructions to not start until 7/15 per Dr. Benay Spice.

## 2018-05-25 MED FILL — XELODA 500 MG TABLET: 500 | 21 days supply | Qty: 98 | Fill #0

## 2018-05-26 ENCOUNTER — Ambulatory Visit: Payer: Medicaid Other | Admitting: Oncology

## 2018-05-26 ENCOUNTER — Other Ambulatory Visit: Payer: Medicaid Other

## 2018-05-29 ENCOUNTER — Inpatient Hospital Stay (HOSPITAL_BASED_OUTPATIENT_CLINIC_OR_DEPARTMENT_OTHER): Payer: Medicaid Other | Admitting: Oncology

## 2018-05-29 ENCOUNTER — Telehealth: Payer: Self-pay | Admitting: Oncology

## 2018-05-29 ENCOUNTER — Inpatient Hospital Stay: Payer: Medicaid Other

## 2018-05-29 VITALS — BP 121/92 | HR 66 | Temp 98.7°F | Resp 18 | Ht 69.0 in | Wt 178.0 lb

## 2018-05-29 DIAGNOSIS — C20 Malignant neoplasm of rectum: Secondary | ICD-10-CM

## 2018-05-29 DIAGNOSIS — Z79899 Other long term (current) drug therapy: Secondary | ICD-10-CM | POA: Diagnosis not present

## 2018-05-29 DIAGNOSIS — D709 Neutropenia, unspecified: Secondary | ICD-10-CM | POA: Diagnosis not present

## 2018-05-29 DIAGNOSIS — R944 Abnormal results of kidney function studies: Secondary | ICD-10-CM | POA: Diagnosis not present

## 2018-05-29 LAB — CBC WITH DIFFERENTIAL (CANCER CENTER ONLY)
BASOS PCT: 1 %
Basophils Absolute: 0 10*3/uL (ref 0.0–0.1)
Eosinophils Absolute: 0.1 10*3/uL (ref 0.0–0.5)
Eosinophils Relative: 5 %
HEMATOCRIT: 41.7 % (ref 38.4–49.9)
HEMOGLOBIN: 14.2 g/dL (ref 13.0–17.1)
LYMPHS PCT: 21 %
Lymphs Abs: 0.5 10*3/uL — ABNORMAL LOW (ref 0.9–3.3)
MCH: 30.3 pg (ref 27.2–33.4)
MCHC: 34.1 g/dL (ref 32.0–36.0)
MCV: 89.1 fL (ref 79.3–98.0)
MONO ABS: 0.5 10*3/uL (ref 0.1–0.9)
MONOS PCT: 22 %
NEUTROS ABS: 1.2 10*3/uL — AB (ref 1.5–6.5)
NEUTROS PCT: 51 %
Platelet Count: 182 10*3/uL (ref 140–400)
RBC: 4.68 MIL/uL (ref 4.20–5.82)
RDW: 19.1 % — AB (ref 11.0–14.6)
WBC Count: 2.3 10*3/uL — ABNORMAL LOW (ref 4.0–10.3)

## 2018-05-29 LAB — CMP (CANCER CENTER ONLY)
ALBUMIN: 3.8 g/dL (ref 3.5–5.0)
ALK PHOS: 201 U/L — AB (ref 38–126)
ALT: 61 U/L — AB (ref 0–44)
ANION GAP: 7 (ref 5–15)
AST: 32 U/L (ref 15–41)
BILIRUBIN TOTAL: 0.6 mg/dL (ref 0.3–1.2)
BUN: 16 mg/dL (ref 6–20)
CALCIUM: 9.8 mg/dL (ref 8.9–10.3)
CO2: 24 mmol/L (ref 22–32)
Chloride: 107 mmol/L (ref 98–111)
Creatinine: 1.34 mg/dL — ABNORMAL HIGH (ref 0.61–1.24)
GFR, Est AFR Am: >60 mL/min (ref >60)
GFR, Estimated: 58 mL/min — ABNORMAL LOW (ref >60)
GLUCOSE: 102 mg/dL — AB (ref 70–99)
Potassium: 4 mmol/L (ref 3.5–5.1)
Sodium: 138 mmol/L (ref 135–145)
Total Protein: 7.9 g/dL (ref 6.5–8.1)

## 2018-05-29 NOTE — Progress Notes (Signed)
[place] OFFICE PROGRESS NOTE   Diagnosis: Rectal cancer  INTERVAL HISTORY:   Craig [name] returns after missing a scheduled visit.  He began another cycle of Xeloda 05/03/2018.  No nausea, mouth sores, or diarrhea.  He has hyperpigmentation of the hands and feet.  He empties the ostomy bag approximately 5 times per day.  Objective:  Vital signs in last 24 hours:  Blood pressure (!) 121/92, pulse 66, temperature 98.7 F (37.1 C), temperature source Oral, resp. rate 18, height 5\' 9"  (1.753 m), weight 178 lb (80.7 kg), SpO2 100 %.    HEENT: No thrush or ulcers Resp: Lungs clear bilaterally Cardio: Regular rate and rhythm GI: No hepatomegaly, right lower quadrant ileostomy with formed stool, mild tenderness at the low transverse incision Vascular: No leg edema  Skin: Hyperpigmentation and skin thickening at the hands and feet, no erythema or skin breakdown   Lab Results:  Lab Results  Component Value Date   WBC 2.3 (L) 05/29/2018   HGB 14.2 05/29/2018   HCT 41.7 05/29/2018   MCV 89.1 05/29/2018   PLT 182 05/29/2018   NEUTROABS 1.2 (L) 05/29/2018    CMP  Lab Results  Component Value Date   NA 138 05/29/2018   K 4.0 05/29/2018   CL 107 05/29/2018   CO2 24 05/29/2018   GLUCOSE 102 (H) 05/29/2018   BUN 16 05/29/2018   CREATININE 1.34 (H) 05/29/2018   CALCIUM 9.8 05/29/2018   PROT 7.9 05/29/2018   ALBUMIN 3.8 05/29/2018   AST 32 05/29/2018   ALT 61 (H) 05/29/2018   ALKPHOS 201 (H) 05/29/2018   BILITOT 0.6 05/29/2018   GFRNONAA 58 (L) 05/29/2018   GFRAA >60 05/29/2018    Lab Results  Component Value Date   CEA1 3.90 12/22/2017    Medications: I have reviewed the patient's current medications.   Assessment/Plan: 1. Rectal cancer ? Colonoscopy 09/08/2017, rectal mass at 5 cm from the anal verge, biopsy confirmed invasive adenocarcinoma ? Staging CTs 09/15/2017, rectal mass and perirectal lymphadenopathy, indeterminate right upper lobe  nodule ? Elevated CEA ? MRI pelvis 10/07/2017-5.3 cm circumferential lower rectal mass with perirectal extension along the inferior mesenteric vein;T3, N1; distance from tumor to the anal sphincter is 5 cm. ? Initiation of radiation and concurrent Xeloda chemotherapy 10/12/2017;completion of radiation/Xeloda 11/23/2017. ? Surgery 02/01/18 per Dr. Nadeen Landau; laparoscopic lower anterior rectosigmoid resection, coloanal anastomosis, diverting loop ileostomy, flexible sigmoidoscopy; ypT3, pN0 ? Developed low-grade fever and abdominal pain on POD 4, CT AP on 02/06/18 and 02/09/18 shows small loculated fluid collections in RLQ and presacral space, consulted IR for transgluteal presacral abscess drain, treated with IV zosyn then transitioned to po cipro and flagyl ? Follow-up CT 03/24/2018- 2.2 x 4.9 x 4.0 cm thick-walled presacral gas and fluid collection/abscess, minimally increased status post removal of surgical drain. No evidence of recurrent or metastatic disease. ? Cycle 1 adjuvant Xeloda 04/12/2018 ? Cycle 2 adjuvant Xeloda 05/03/2018 ? Cycle 3 adjuvant Xeloda 05/29/2018 (Xeloda dose reduced to 1500 mg twice daily secondary to mild neutropenia)  2.Pain and bleeding secondary to #1.Resolved.  3.Skin breakdown at the gluteal fold secondary to radiation. Resolved.  4.  Neutropenia secondary to chemotherapy   Disposition: Craig [name] appears well.  He will begin cycle 3 adjuvant Xeloda today.  He has mild neutropenia and the creatinine is mildly elevated.  The creatinine has been high in the past.  I encouraged him to increase fluid intake.  He will call for a fever.  The Xeloda will be dose reduced with this cycle.  He will return for a lab visit in 1 week.  Craig [name] will return for an office visit in 3 weeks.  25 minutes were spent with the patient today.  The majority of the time was used for counseling and coordination of care.  Betsy Coder, MD  05/29/2018  9:51 AM

## 2018-05-29 NOTE — Telephone Encounter (Signed)
Gave patient avs and calendar of upcoming July and aug appts.

## 2018-06-01 ENCOUNTER — Other Ambulatory Visit: Payer: Self-pay

## 2018-06-01 DIAGNOSIS — C20 Malignant neoplasm of rectum: Secondary | ICD-10-CM

## 2018-06-01 MED ORDER — XELODA 500 MG PO TABS: ORAL_TABLET | ORAL | 0 refills | Status: DC

## 2018-06-02 ENCOUNTER — Other Ambulatory Visit: Payer: Self-pay | Admitting: Pharmacist

## 2018-06-02 DIAGNOSIS — C20 Malignant neoplasm of rectum: Secondary | ICD-10-CM

## 2018-06-02 MED ORDER — XELODA 500 MG PO TABS: ORAL_TABLET | ORAL | 0 refills | Status: DC

## 2018-06-02 NOTE — Telephone Encounter (Signed)
Oral Oncology Pharmacist Encounter  Received notification from [facility] long outpatient pharmacy that they needed some clarification about Xeloda prescription. Per pharmacy, there were incomplete directions for cycle length, and quantity order did not match directions.  Xeloda Rx E scribed to the pharmacy today with updated directions patient and quantity.  Johny Drilling, PharmD, BCPS, BCOP  06/02/2018 2:33 PM Oral Oncology Clinic [phone]

## 2018-06-05 ENCOUNTER — Inpatient Hospital Stay: Payer: Medicaid Other

## 2018-06-05 DIAGNOSIS — C20 Malignant neoplasm of rectum: Secondary | ICD-10-CM

## 2018-06-05 LAB — CBC WITH DIFFERENTIAL (CANCER CENTER ONLY)
Basophils Absolute: 0 10*3/uL (ref 0.0–0.1)
Basophils Relative: 1 %
EOS ABS: 0.1 10*3/uL (ref 0.0–0.5)
EOS PCT: 6 %
HCT: 41.8 % (ref 38.4–49.9)
Hemoglobin: 14.2 g/dL (ref 13.0–17.1)
LYMPHS ABS: 0.3 10*3/uL — AB (ref 0.9–3.3)
Lymphocytes Relative: 18 %
MCH: 30.3 pg (ref 27.2–33.4)
MCHC: 34 g/dL (ref 32.0–36.0)
MCV: 89.3 fL (ref 79.3–98.0)
MONO ABS: 0.3 10*3/uL (ref 0.1–0.9)
MONOS PCT: 17 %
Neutro Abs: 1.1 10*3/uL — ABNORMAL LOW (ref 1.5–6.5)
Neutrophils Relative %: 58 %
PLATELETS: 173 10*3/uL (ref 140–400)
RBC: 4.68 MIL/uL (ref 4.20–5.82)
RDW: 18.9 % — AB (ref 11.0–14.6)
WBC Count: 1.8 10*3/uL — ABNORMAL LOW (ref 4.0–10.3)

## 2018-06-05 LAB — CMP (CANCER CENTER ONLY)
ALK PHOS: 178 U/L — AB (ref 38–126)
ALT: 54 U/L — ABNORMAL HIGH (ref 0–44)
AST: 28 U/L (ref 15–41)
Albumin: 3.7 g/dL (ref 3.5–5.0)
Anion gap: 7 (ref 5–15)
BUN: 14 mg/dL (ref 6–20)
CALCIUM: 9.5 mg/dL (ref 8.9–10.3)
CO2: 23 mmol/L (ref 22–32)
Chloride: 108 mmol/L (ref 98–111)
Creatinine: 1.14 mg/dL (ref 0.61–1.24)
GFR, Estimated: >60 mL/min (ref >60)
Glucose, Bld: 95 mg/dL (ref 70–99)
Potassium: 4.1 mmol/L (ref 3.5–5.1)
SODIUM: 138 mmol/L (ref 135–145)
Total Bilirubin: 0.5 mg/dL (ref 0.3–1.2)
Total Protein: 7.7 g/dL (ref 6.5–8.1)

## 2018-06-07 ENCOUNTER — Telehealth: Payer: Self-pay | Admitting: Emergency Medicine

## 2018-06-07 NOTE — Telephone Encounter (Addendum)
VM left w/ detailed message   ----- Message from Ladell Pier, MD sent at 06/05/2018  7:25 PM EDT ----- Please call patient, renal funciton is better, white count is stable, f/u as scheduled

## 2018-06-19 ENCOUNTER — Inpatient Hospital Stay (HOSPITAL_BASED_OUTPATIENT_CLINIC_OR_DEPARTMENT_OTHER): Payer: Medicaid Other | Admitting: Nurse Practitioner

## 2018-06-19 ENCOUNTER — Encounter: Payer: Self-pay | Admitting: Nurse Practitioner

## 2018-06-19 ENCOUNTER — Inpatient Hospital Stay: Payer: Medicaid Other | Attending: Oncology

## 2018-06-19 ENCOUNTER — Telehealth: Payer: Self-pay | Admitting: Oncology

## 2018-06-19 VITALS — BP 121/96 | HR 77 | Temp 98.2°F | Resp 18 | Ht 69.0 in | Wt 181.1 lb

## 2018-06-19 DIAGNOSIS — Z9221 Personal history of antineoplastic chemotherapy: Secondary | ICD-10-CM | POA: Diagnosis not present

## 2018-06-19 DIAGNOSIS — D701 Agranulocytosis secondary to cancer chemotherapy: Secondary | ICD-10-CM | POA: Insufficient documentation

## 2018-06-19 DIAGNOSIS — Z923 Personal history of irradiation: Secondary | ICD-10-CM | POA: Diagnosis not present

## 2018-06-19 DIAGNOSIS — C20 Malignant neoplasm of rectum: Secondary | ICD-10-CM | POA: Diagnosis not present

## 2018-06-19 LAB — CBC WITH DIFFERENTIAL (CANCER CENTER ONLY)
Basophils Absolute: 0 10*3/uL (ref 0.0–0.1)
Basophils Relative: 1 %
Eosinophils Absolute: 0.1 10*3/uL (ref 0.0–0.5)
Eosinophils Relative: 6 %
HCT: 41.7 % (ref 38.4–49.9)
Hemoglobin: 14.2 g/dL (ref 13.0–17.1)
Lymphocytes Relative: 17 %
Lymphs Abs: 0.4 10*3/uL — ABNORMAL LOW (ref 0.9–3.3)
MCH: 31.6 pg (ref 27.2–33.4)
MCHC: 34 g/dL (ref 32.0–36.0)
MCV: 93.1 fL (ref 79.3–98.0)
Monocytes Absolute: 0.4 10*3/uL (ref 0.1–0.9)
Monocytes Relative: 18 %
Neutro Abs: 1.3 10*3/uL — ABNORMAL LOW (ref 1.5–6.5)
Neutrophils Relative %: 58 %
Platelet Count: 216 10*3/uL (ref 140–400)
RBC: 4.48 MIL/uL (ref 4.20–5.82)
RDW: 22.7 % — ABNORMAL HIGH (ref 11.0–14.6)
WBC Count: 2.2 10*3/uL — ABNORMAL LOW (ref 4.0–10.3)

## 2018-06-19 LAB — CMP (CANCER CENTER ONLY)
ALT: 26 U/L (ref 0–44)
AST: 18 U/L (ref 15–41)
Albumin: 3.7 g/dL (ref 3.5–5.0)
Alkaline Phosphatase: 161 U/L — ABNORMAL HIGH (ref 38–126)
Anion gap: 9 (ref 5–15)
BUN: 18 mg/dL (ref 6–20)
CO2: 21 mmol/L — ABNORMAL LOW (ref 22–32)
Calcium: 9.2 mg/dL (ref 8.9–10.3)
Chloride: 109 mmol/L (ref 98–111)
Creatinine: 1.2 mg/dL (ref 0.61–1.24)
GFR, Est AFR Am: >60 mL/min (ref >60)
GFR, Estimated: >60 mL/min (ref >60)
Glucose, Bld: 95 mg/dL (ref 70–99)
Potassium: 4 mmol/L (ref 3.5–5.1)
Sodium: 139 mmol/L (ref 135–145)
Total Bilirubin: 0.6 mg/dL (ref 0.3–1.2)
Total Protein: 7.7 g/dL (ref 6.5–8.1)

## 2018-06-19 MED FILL — XELODA 500 MG TABLET: 500 | 21 days supply | Qty: 84 | Fill #0

## 2018-06-19 NOTE — Telephone Encounter (Signed)
Appt scheduled AVS/declined / calendar printed per 8/5 los

## 2018-06-19 NOTE — Progress Notes (Signed)
  [place] OFFICE PROGRESS NOTE   Diagnosis: Rectal cancer  INTERVAL HISTORY:   Mr. Thelen returns as scheduled.  He completed cycle 3 adjuvant Xeloda beginning 06/05/2018.  He denies nausea/vomiting.  No mouth sores.  No diarrhea.  No hand or foot pain or redness.  He continues to note darkening over the palms and soles.  Objective:  Vital signs in last 24 hours:  Blood pressure (!) 121/96, pulse 77, temperature 98.2 F (36.8 C), temperature source Oral, resp. rate 18, height 5\' 9"  (1.753 m), weight 181 lb 1.6 oz (82.1 kg), SpO2 100 %.    HEENT: No thrush or ulcers. Resp: Lungs clear bilaterally. Cardio: Regular rate and rhythm. GI: Abdomen soft and nontender.  No hepatomegaly.  Right abdomen ileostomy. Vascular: No leg edema. Neuro: Alert and oriented. Skin: Palms with hyperpigmentation.  No erythema or skin breakdown.   Lab Results:  Lab Results  Component Value Date   WBC 1.8 (L) 06/05/2018   HGB 14.2 06/05/2018   HCT 41.8 06/05/2018   MCV 89.3 06/05/2018   PLT 173 06/05/2018   NEUTROABS 1.1 (L) 06/05/2018    Imaging:  No results found.  Medications: I have reviewed the patient's current medications.  Assessment/Plan: 1. Rectal cancer ? Colonoscopy 09/08/2017, rectal mass at 5 cm from the anal verge, biopsy confirmed invasive adenocarcinoma ? Staging CTs 09/15/2017, rectal mass and perirectal lymphadenopathy, indeterminate right upper lobe nodule ? Elevated CEA ? MRI pelvis 10/07/2017-5.3 cm circumferential lower rectal mass with perirectal extension along the inferior mesenteric vein;T3, N1; distance from tumor to the anal sphincter is 5 cm. ? Initiation of radiation and concurrent Xeloda chemotherapy 10/12/2017;completion of radiation/Xeloda 11/23/2017. ? Surgery 02/01/18 per Dr. Nadeen Landau; laparoscopic lower anterior rectosigmoid resection, coloanal anastomosis, diverting loop ileostomy, flexible sigmoidoscopy; ypT3, pN0 ? Developed  low-grade fever and abdominal pain on POD 4, CT AP on 02/06/18 and 02/09/18 shows small loculated fluid collections in RLQ and presacral space, consulted IR for transgluteal presacral abscess drain, treated with IV zosyn then transitioned to po cipro and flagyl ? Follow-up CT 03/24/2018-2.2 x 4.9 x 4.0 cm thick-walled presacral gas and fluid collection/abscess, minimally increased status post removal of surgical drain. No evidence of recurrent or metastatic disease. ? Cycle 1 adjuvant Xeloda 04/12/2018 ? Cycle 2 adjuvant Xeloda 05/03/2018 ? Cycle 3 adjuvant Xeloda 06/05/2018 (Xeloda dose reduced to 1500 mg twice daily secondary to mild neutropenia) ? Cycle 4 adjuvant Xeloda 06/26/2018  2.Pain and bleeding secondary to #1.Resolved.  3.Skin breakdown at the gluteal fold secondary to radiation. Resolved.  4.  Neutropenia secondary to chemotherapy    Disposition: Mr. Dutch appears stable.  He has completed 3 cycles of adjuvant Xeloda.  Plan to proceed with cycle 4 as scheduled beginning 06/26/2018.  We reviewed the CBC from today.  He has stable mild neutropenia.  He understands to contact the office with fever, chills, other signs of infection.  He will return for lab and follow-up in approximately 3 weeks.  He will contact the office in the interim with any problems.  Plan reviewed with Dr. Benay Spice.    Ned Card ANP/GNP-BC   06/19/2018  9:58 AM

## 2018-06-20 ENCOUNTER — Telehealth: Payer: Self-pay | Admitting: Emergency Medicine

## 2018-06-20 NOTE — Telephone Encounter (Addendum)
VM left w/ detailed message.   ----- Message from Owens Shark, NP sent at 06/19/2018 10:42 AM EDT ----- Please let him know Dr. Benay Spice recommends 5 total cycles of Xeloda.

## 2018-07-06 ENCOUNTER — Other Ambulatory Visit: Payer: Self-pay | Admitting: Oncology

## 2018-07-06 DIAGNOSIS — C20 Malignant neoplasm of rectum: Secondary | ICD-10-CM

## 2018-07-10 ENCOUNTER — Inpatient Hospital Stay: Payer: Medicaid Other | Admitting: Nurse Practitioner

## 2018-07-10 ENCOUNTER — Inpatient Hospital Stay: Payer: Medicaid Other

## 2018-07-10 MED FILL — XELODA 500 MG TABLET: 500 | 21 days supply | Qty: 84 | Fill #0

## 2018-07-12 ENCOUNTER — Telehealth: Payer: Self-pay | Admitting: Oncology

## 2018-07-12 NOTE — Telephone Encounter (Signed)
Returned call to patient requesting to r/s his missed appt/  Appt moved as requested and I spoke with patient per 8/27 VM

## 2018-07-14 ENCOUNTER — Ambulatory Visit: Payer: Medicaid Other | Admitting: Nurse Practitioner

## 2018-07-14 ENCOUNTER — Other Ambulatory Visit: Payer: Medicaid Other

## 2018-07-19 ENCOUNTER — Inpatient Hospital Stay (HOSPITAL_BASED_OUTPATIENT_CLINIC_OR_DEPARTMENT_OTHER): Payer: Medicaid Other | Admitting: Nurse Practitioner

## 2018-07-19 ENCOUNTER — Inpatient Hospital Stay: Payer: Medicaid Other | Attending: Oncology

## 2018-07-19 ENCOUNTER — Telehealth: Payer: Self-pay | Admitting: Oncology

## 2018-07-19 ENCOUNTER — Encounter: Payer: Self-pay | Admitting: Nurse Practitioner

## 2018-07-19 VITALS — BP 116/81 | HR 86 | Temp 98.7°F | Resp 18 | Ht 69.0 in | Wt 189.9 lb

## 2018-07-19 DIAGNOSIS — D709 Neutropenia, unspecified: Secondary | ICD-10-CM | POA: Insufficient documentation

## 2018-07-19 DIAGNOSIS — R970 Elevated carcinoembryonic antigen [CEA]: Secondary | ICD-10-CM | POA: Diagnosis not present

## 2018-07-19 DIAGNOSIS — C20 Malignant neoplasm of rectum: Secondary | ICD-10-CM | POA: Diagnosis present

## 2018-07-19 DIAGNOSIS — Z9221 Personal history of antineoplastic chemotherapy: Secondary | ICD-10-CM | POA: Insufficient documentation

## 2018-07-19 DIAGNOSIS — Z932 Ileostomy status: Secondary | ICD-10-CM | POA: Diagnosis not present

## 2018-07-19 DIAGNOSIS — Z79899 Other long term (current) drug therapy: Secondary | ICD-10-CM | POA: Diagnosis not present

## 2018-07-19 DIAGNOSIS — R911 Solitary pulmonary nodule: Secondary | ICD-10-CM | POA: Diagnosis not present

## 2018-07-19 DIAGNOSIS — Z923 Personal history of irradiation: Secondary | ICD-10-CM | POA: Insufficient documentation

## 2018-07-19 LAB — CMP (CANCER CENTER ONLY)
ALBUMIN: 3.6 g/dL (ref 3.5–5.0)
ALK PHOS: 149 U/L — AB (ref 38–126)
ALT: 40 U/L (ref 0–44)
AST: 25 U/L (ref 15–41)
Anion gap: 7 (ref 5–15)
BUN: 13 mg/dL (ref 6–20)
CALCIUM: 9.7 mg/dL (ref 8.9–10.3)
CO2: 27 mmol/L (ref 22–32)
CREATININE: 1.33 mg/dL — AB (ref 0.61–1.24)
Chloride: 105 mmol/L (ref 98–111)
GFR, Est AFR Am: >60 mL/min (ref >60)
GFR, Estimated: 58 mL/min — ABNORMAL LOW (ref >60)
GLUCOSE: 105 mg/dL — AB (ref 70–99)
Potassium: 4.3 mmol/L (ref 3.5–5.1)
SODIUM: 139 mmol/L (ref 135–145)
Total Bilirubin: 0.6 mg/dL (ref 0.3–1.2)
Total Protein: 7.6 g/dL (ref 6.5–8.1)

## 2018-07-19 LAB — CBC WITH DIFFERENTIAL (CANCER CENTER ONLY)
Basophils Absolute: 0 10*3/uL (ref 0.0–0.1)
Basophils Relative: 1 %
EOS ABS: 0.1 10*3/uL (ref 0.0–0.5)
EOS PCT: 6 %
HCT: 44.1 % (ref 38.4–49.9)
Hemoglobin: 14.8 g/dL (ref 13.0–17.1)
Lymphocytes Relative: 20 %
Lymphs Abs: 0.4 10*3/uL — ABNORMAL LOW (ref 0.9–3.3)
MCH: 32.4 pg (ref 27.2–33.4)
MCHC: 33.5 g/dL (ref 32.0–36.0)
MCV: 96.8 fL (ref 79.3–98.0)
Monocytes Absolute: 0.4 10*3/uL (ref 0.1–0.9)
Monocytes Relative: 18 %
NEUTROS ABS: 1.1 10*3/uL — AB (ref 1.5–6.5)
Neutrophils Relative %: 55 %
PLATELETS: 203 10*3/uL (ref 140–400)
RBC: 4.55 MIL/uL (ref 4.20–5.82)
RDW: 20.6 % — ABNORMAL HIGH (ref 11.0–14.6)
WBC Count: 2 10*3/uL — ABNORMAL LOW (ref 4.0–10.3)

## 2018-07-19 NOTE — Progress Notes (Signed)
  [place] OFFICE PROGRESS NOTE   Diagnosis: Rectal cancer  INTERVAL HISTORY:   Craig [name] returns for follow-up.  He completed cycle 4 adjuvant Xeloda beginning 06/26/2018.  He reports beginning cycle 5 Xeloda yesterday.  He denies nausea/vomiting.  No mouth sores.  No significant diarrhea.  No hand or foot pain or redness.  He continues to note darkening on the palms.  Objective:  Vital signs in last 24 hours:  Blood pressure 116/81, pulse 86, temperature 98.7 F (37.1 C), temperature source Oral, resp. rate 18, height 5\' 9"  (1.753 m), weight 189 lb 14.4 oz (86.1 kg), SpO2 100 %.    HEENT: No thrush or ulcers. Resp: Lungs clear bilaterally. Cardio: Regular rate and rhythm. GI: Abdomen soft and nontender.  No hepatomegaly.  Right abdomen ileostomy. Vascular: No leg edema. Skin: Palms with hyperpigmentation, no erythema.   Lab Results:  Lab Results  Component Value Date   WBC 2.0 (L) 07/19/2018   HGB 14.8 07/19/2018   HCT 44.1 07/19/2018   MCV 96.8 07/19/2018   PLT 203 07/19/2018   NEUTROABS 1.1 (L) 07/19/2018    Imaging:  No results found.  Medications: I have reviewed the patient's current medications.  Assessment/Plan: 1. Rectal cancer ? Colonoscopy 09/08/2017, rectal mass at 5 cm from the anal verge, biopsy confirmed invasive adenocarcinoma ? Staging CTs 09/15/2017, rectal mass and perirectal lymphadenopathy, indeterminate right upper lobe nodule ? Elevated CEA ? MRI pelvis 10/07/2017-5.3 cm circumferential lower rectal mass with perirectal extension along the inferior mesenteric vein;T3, N1; distance from tumor to the anal sphincter is 5 cm. ? Initiation of radiation and concurrent Xeloda chemotherapy 10/12/2017;completion of radiation/Xeloda 11/23/2017. ? Surgery 02/01/18 per Dr. Nadeen Landau; laparoscopic lower anterior rectosigmoid resection, coloanal anastomosis, diverting loop ileostomy, flexible sigmoidoscopy; ypT3, pN0 ? Developed  low-grade fever and abdominal pain on POD 4, CT AP on 02/06/18 and 02/09/18 shows small loculated fluid collections in RLQ and presacral space, consulted IR for transgluteal presacral abscess drain, treated with IV zosyn then transitioned to po cipro and flagyl ? Follow-up CT 03/24/2018-2.2 x 4.9 x 4.0 cm thick-walled presacral gas and fluid collection/abscess, minimally increased status post removal of surgical drain. No evidence of recurrent or metastatic disease. ? Cycle 1 adjuvant Xeloda 04/12/2018 ? Cycle 2 adjuvant Xeloda 05/03/2018 ? Cycle 3 adjuvant Xeloda 06/05/2018 (Xeloda dose reduced to 1500 mg twice daily secondary to mild neutropenia) ? Cycle 4 adjuvant Xeloda 06/26/2018 ? Cycle 5 adjuvant Xeloda 07/18/2018  2.Pain and bleeding secondary to #1.Resolved.  3.Skin breakdown at the gluteal fold secondary to radiation. Resolved.  4.Neutropenia secondary to chemotherapy   Disposition: Craig [name] appears stable.  He has completed 4 cycles of adjuvant Xeloda.  He began the fifth and final cycle of adjuvant Xeloda yesterday.  Overall he is tolerating well.    We reviewed the CBC from today.  He has stable mild neutropenia.  He understands to contact the office with fever, chills, other signs of infection.  He will return for lab and follow-up in 4 weeks.  He will contact the office in the interim as outlined above or with any other problems.  Plan reviewed with Dr. Benay Spice.  Ned Card ANP/GNP-BC   07/19/2018  3:11 PM

## 2018-07-19 NOTE — Telephone Encounter (Signed)
Appts scheduled AVS declined / Calendar printed per 9/4 los

## 2018-07-20 LAB — CEA (IN HOUSE-CHCC): CEA (CHCC-IN HOUSE): 1.31 ng/mL (ref 0.00–5.00)

## 2018-07-21 ENCOUNTER — Encounter (INDEPENDENT_AMBULATORY_CARE_PROVIDER_SITE_OTHER): Payer: Self-pay | Admitting: *Deleted

## 2018-07-31 ENCOUNTER — Telehealth: Payer: Self-pay

## 2018-07-31 ENCOUNTER — Telehealth: Payer: Self-pay | Admitting: *Deleted

## 2018-07-31 ENCOUNTER — Inpatient Hospital Stay (HOSPITAL_BASED_OUTPATIENT_CLINIC_OR_DEPARTMENT_OTHER): Payer: Medicaid Other | Admitting: Oncology

## 2018-07-31 ENCOUNTER — Inpatient Hospital Stay: Payer: Medicaid Other

## 2018-07-31 ENCOUNTER — Encounter: Payer: Self-pay | Admitting: Oncology

## 2018-07-31 VITALS — BP 123/83 | HR 78 | Temp 98.5°F | Resp 17 | Ht 69.0 in | Wt 189.7 lb

## 2018-07-31 DIAGNOSIS — Z932 Ileostomy status: Secondary | ICD-10-CM

## 2018-07-31 DIAGNOSIS — D709 Neutropenia, unspecified: Secondary | ICD-10-CM

## 2018-07-31 DIAGNOSIS — Z9221 Personal history of antineoplastic chemotherapy: Secondary | ICD-10-CM

## 2018-07-31 DIAGNOSIS — C20 Malignant neoplasm of rectum: Secondary | ICD-10-CM

## 2018-07-31 DIAGNOSIS — Z79899 Other long term (current) drug therapy: Secondary | ICD-10-CM

## 2018-07-31 DIAGNOSIS — R911 Solitary pulmonary nodule: Secondary | ICD-10-CM

## 2018-07-31 DIAGNOSIS — Z923 Personal history of irradiation: Secondary | ICD-10-CM

## 2018-07-31 DIAGNOSIS — R970 Elevated carcinoembryonic antigen [CEA]: Secondary | ICD-10-CM | POA: Diagnosis not present

## 2018-07-31 LAB — CBC WITH DIFFERENTIAL (CANCER CENTER ONLY)
BASOS ABS: 0 10*3/uL (ref 0.0–0.1)
BASOS PCT: 1 %
EOS ABS: 0.1 10*3/uL (ref 0.0–0.5)
Eosinophils Relative: 5 %
HEMATOCRIT: 43.4 % (ref 38.4–49.9)
HEMOGLOBIN: 14.7 g/dL (ref 13.0–17.1)
Lymphocytes Relative: 18 %
Lymphs Abs: 0.5 10*3/uL — ABNORMAL LOW (ref 0.9–3.3)
MCH: 33.3 pg (ref 27.2–33.4)
MCHC: 34 g/dL (ref 32.0–36.0)
MCV: 97.9 fL (ref 79.3–98.0)
Monocytes Absolute: 0.4 10*3/uL (ref 0.1–0.9)
Monocytes Relative: 14 %
NEUTROS ABS: 1.7 10*3/uL (ref 1.5–6.5)
Neutrophils Relative %: 62 %
Platelet Count: 184 10*3/uL (ref 140–400)
RBC: 4.43 MIL/uL (ref 4.20–5.82)
RDW: 20 % — ABNORMAL HIGH (ref 11.0–14.6)
WBC: 2.7 10*3/uL — AB (ref 4.0–10.3)

## 2018-07-31 LAB — CMP (CANCER CENTER ONLY)
ALBUMIN: 3.9 g/dL (ref 3.5–5.0)
ALK PHOS: 146 U/L — AB (ref 38–126)
ALT: 35 U/L (ref 0–44)
ANION GAP: 11 (ref 5–15)
AST: 19 U/L (ref 15–41)
BILIRUBIN TOTAL: 0.7 mg/dL (ref 0.3–1.2)
BUN: 15 mg/dL (ref 6–20)
CALCIUM: 9.6 mg/dL (ref 8.9–10.3)
CO2: 20 mmol/L — AB (ref 22–32)
Chloride: 106 mmol/L (ref 98–111)
Creatinine: 1.41 mg/dL — ABNORMAL HIGH (ref 0.61–1.24)
GFR, Est AFR Am: >60 mL/min (ref >60)
GFR, Estimated: 54 mL/min — ABNORMAL LOW (ref >60)
GLUCOSE: 142 mg/dL — AB (ref 70–99)
Potassium: 3.9 mmol/L (ref 3.5–5.1)
SODIUM: 137 mmol/L (ref 135–145)
TOTAL PROTEIN: 7.9 g/dL (ref 6.5–8.1)

## 2018-07-31 NOTE — Telephone Encounter (Signed)
Call received from [name].  "I have an appointment today.  I can't remember the time."  Informed to arrive at 11:00 am for registration process.  Currently scheduled for 11:30 lab; 12:00 F/U.  Denies further needs or questions at this time.

## 2018-07-31 NOTE — Telephone Encounter (Signed)
Printed avs and calender of upcoming appointment. Per 9/16

## 2018-07-31 NOTE — Progress Notes (Signed)
[facility] OFFICE PROGRESS NOTE   Diagnosis: Rectal cancer  INTERVAL HISTORY:   Craig [name] returns as scheduled.  He began the final cycle of Xeloda on 07/18/2018.  No mouth sores, diarrhea, or hand/foot pain.  He empties the ileostomy bag approximately 5 times daily.  He takes Imodium occasionally.  Objective:  Vital signs in last 24 hours:  Blood pressure 123/83, pulse 78, temperature 98.5 F (36.9 C), temperature source Oral, resp. rate 17, height 5\' 9"  (1.753 m), weight 189 lb 11.2 oz (86 kg), SpO2 100 %.    HEENT: No thrush or ulcers Resp: Lungs clear bilaterally Cardio: Regular rate and rhythm GI: No hepatomegaly, no mass, nontender Vascular: No leg edema  Skin: Hyperpigmentation of the hands and feet, dryness of the soles, no skin breakdown Lymph nodes no cervical, supraclavicular, axillary, or inguinal nodes  Portacath/PICC-without erythema  Lab Results:  Lab Results  Component Value Date   WBC 2.7 (L) 07/31/2018   HGB 14.7 07/31/2018   HCT 43.4 07/31/2018   MCV 97.9 07/31/2018   PLT 184 07/31/2018   NEUTROABS 1.7 07/31/2018    CMP  Lab Results  Component Value Date   NA 137 07/31/2018   K 3.9 07/31/2018   CL 106 07/31/2018   CO2 20 (L) 07/31/2018   GLUCOSE 142 (H) 07/31/2018   BUN 15 07/31/2018   CREATININE 1.41 (H) 07/31/2018   CALCIUM 9.6 07/31/2018   PROT 7.9 07/31/2018   ALBUMIN 3.9 07/31/2018   AST 19 07/31/2018   ALT 35 07/31/2018   ALKPHOS 146 (H) 07/31/2018   BILITOT 0.7 07/31/2018   GFRNONAA 54 (L) 07/31/2018   GFRAA >60 07/31/2018    Lab Results  Component Value Date   CEA1 1.31 07/19/2018     Medications: I have reviewed the patient's current medications.   Assessment/Plan:  1. Rectal cancer ? Colonoscopy 09/08/2017, rectal mass at 5 cm from the anal verge, biopsy confirmed invasive adenocarcinoma ? Staging CTs 09/15/2017, rectal mass and perirectal lymphadenopathy, indeterminate right upper lobe  nodule ? Elevated CEA ? MRI pelvis 10/07/2017-5.3 cm circumferential lower rectal mass with perirectal extension along the inferior mesenteric vein;T3, N1; distance from tumor to the anal sphincter is 5 cm. ? Initiation of radiation and concurrent Xeloda chemotherapy 10/12/2017;completion of radiation/Xeloda 11/23/2017. ? Surgery 02/01/18 per Dr. Nadeen Landau; laparoscopic lower anterior rectosigmoid resection, coloanal anastomosis, diverting loop ileostomy, flexible sigmoidoscopy; ypT3, pN0 ? Developed low-grade fever and abdominal pain on POD 4, CT AP on 02/06/18 and 02/09/18 shows small loculated fluid collections in RLQ and presacral space, consulted IR for transgluteal presacral abscess drain, treated with IV zosyn then transitioned to po cipro and flagyl ? Follow-up CT 03/24/2018-2.2 x 4.9 x 4.0 cm thick-walled presacral gas and fluid collection/abscess, minimally increased status post removal of surgical drain. No evidence of recurrent or metastatic disease. ? Cycle 1 adjuvant Xeloda 04/12/2018 ? Cycle 2 adjuvant Xeloda 05/03/2018 ? Cycle 3 adjuvant Xeloda 06/05/2018 (Xeloda dose reduced to 1500 mg twice daily secondary to mild neutropenia) ? Cycle 4adjuvant Xeloda 06/26/2018 ? Cycle 5 adjuvant Xeloda 07/18/2018  2.Pain and bleeding secondary to #1.Resolved.  3.Skin breakdown at the gluteal fold secondary to radiation. Resolved.  4. History ofnutropenia secondary to chemotherapy    Disposition: Craig [name] will complete the final cycle of Xeloda today.  He will schedule an appointment with Dr. Dema Severin to plan the ileostomy takedown.  He is in clinical remission from rectal cancer.  Craig [name] will return for an office  visit and CEA in 3 months.  The mild elevation of creatinine may be related to the ileostomy.  We will recommend he use Imodium for liquid stool output and push oral hydration.  Betsy Coder, MD  07/31/2018  12:09 PM

## 2018-08-04 ENCOUNTER — Telehealth: Payer: Self-pay | Admitting: Oncology

## 2018-08-04 ENCOUNTER — Telehealth: Payer: Self-pay | Admitting: Emergency Medicine

## 2018-08-04 NOTE — Telephone Encounter (Addendum)
Detailed VM left. Scheduling message sent for labs.   ----- Message from Owens Shark, NP sent at 08/02/2018  5:02 PM EDT ----- Please let him know his kidney function was slightly off at the time of his last visit.  Instruct him to increase fluid intake.  Repeat BMET in 2 weeks.  Thanks

## 2018-08-04 NOTE — Telephone Encounter (Signed)
Appts scheduled LMVM for patient w/ date/time per 9/20 sch msg

## 2018-08-07 ENCOUNTER — Ambulatory Visit: Payer: Self-pay | Admitting: Surgery

## 2018-08-07 NOTE — H&P (Signed)
CC: Postop f/u  PRIOR HISTORY: Craig [name] is a very pleasant 57 year old male referred to me by Dr. Laural Golden for evaluation of rectal cancer and we initially met him 09/2017. He underwent his first ever colonoscopy 09/08/17. Findings were remarkable for a partially obstructing tumor in the rectum approximate 5 cm needle verge by endoscopic evaluation. This is biopsied and returned invasive adenocarcinoma. The rest of his endoscopic exam was unremarkable.  He subsequently underwent staging CT chest/abdomen/pelvis which was notable for a bulky rectal mass with perirectal nodal metastases, nonspecific right upper lobe nodule but no other evidence of distant metastatic disease.  MRI Pelvis 10/07/17 - rectal mass with shortest distance from tumor to anal sphincter being 5 cm; tumor extending on the left and extending superiorly along the course of the IMV. One enlarged pathologic appearing 19m perirectal LN. On imaging this was read as a T3N1 lesion.  He subsequently underwent neoadjuvant chemoradiation with Xeloda under the care of Dr. SBenay Spice- which he completed 11/23/17. He tolerated this therapy well and noted substantial improvement in his symptoms throughout his treatment course. He no longer had partial obstructive symptoms, was tolerating a diet without nausea or vomiting and had noted substantially improved amounts of bleeding with bowel movements. The pelvic pain and experienced has also resolved.  He went to the OR 02/01/18 for laparoscopic ultra-low anterior resection, double stapled coloanal anastomosis, takedown of splenic flexure, flex sig and diverting loop ileostomy + Cysto/stents by urology. Postoperatively he developed a leak from his coloanal anastomosis that was initially controlled with the surgical drain and IV antibiotics. He also had some smaller fluid collections near the small bowel the right abdomen that were not amenable to percutaneous drainage and were managed with IV  antibiotics. The collection in the pelvis persisted on follow-up imaging and IR was consult for placement of percutaneous drain which they did through the left gluteal approach. He did well from this. Throughout all of this, he was tolerating a diet and had no symptoms. His ileostomy output was managed on Imodium. He is also Discharge hospital with a course of oral antibiotics-Cipro/Flagyl.   Path was reviewed with the patient - ISOLATED FOCI OF RESIDUAL ADENOCARCINOMA INVOLVING RECTOSIGMOID COLON. SEE NOTE. - TUMOR INVADES INTO PERICOLONIC SOFT TISSUE. - LARGEST FOCUS OF CARCINOMA MEASURES 0.2 CM. - NEAR COMPLETE TREATMENT RESPONSE (TUMOR REGRESSION SCORE 1). - ALL MARGINS ARE NEGATIVE FOR CARCINOMA. - NEGATIVE FOR LYMPHOVASCULAR OR PERINEURAL INVASION. - SIX LYMPH NODES, NEGATIVE FOR CARCINOMA (0/6). SEE NOTE - SEPARATE TUBULAR ADENOMA WITHOUT HIGH GRADE DYSPLASIA OR MALIGNANCY. Donuts negative. ypT3N0 (0/6)M0.  Repeat CT scan was obtained 02/24/18 - which demonstrated near complete resolution of the presacral fluid collection and near complete resolution of the fluid collection in the right lower quadrant and suprapubic abdominal wall. He continued his course of Cipro/Flagyl which he completed.  INTERVAL HISTORY He completed adjuvant therapy with Xeloda last week. He denies any complaints today. He has been gaining weight. He denies nausea/vomiting. His stoma is working well without issues with output or dehydration. 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  The patient is a 57year old male.   Allergies (Tanisha A. BOwens Shark RHanston 08/07/2018 2:12 PM) No Known Drug Allergies [03/02/2018]: Allergies Reconciled   Medication History (Tanisha A. BOwens Shark RMA; 08/07/2018 2:13 PM) Gabapentin (300MG Capsule, 1 (one) Oral three times daily, Taken starting 09/23/2017) Active. Diphenhist (25MG Tablet, 2 TAB Oral) Active. Ibuprofen (400MG  Tablet, Oral) Active.  Naphcon-A (0.025-0.3% Solution, Ophthalmic) Active. Oxymeta-12 (0.05% Solution, Nasal) Active. TraMADol HCl (50MG Tablet, Oral) Active. Normal Saline Flush (0.9% Solution, Intravenous) Active. Medications Reconciled    Review of Systems Craig Gave M. Akiva Brassfield MD; 08/07/2018 3:24 PM) General Not Present- Anorexia, Appetite Loss, Chills and Fever. Skin Not Present- Brittle Nails and Bruising. HEENT Not Present- Double Vision and Headache. Neck Not Present- Neck Mass and Neck Pain. Respiratory Not Present- Bloody sputum, Chronic Cough and Decreased Exercise Tolerance. Cardiovascular Not Present- Chest Pain and Difficulty Breathing Lying Down. Gastrointestinal Not Present- Abdominal Pain, Bloating, Bloody Stool, Change in Bowel Habits, Chronic diarrhea, Constipation, Difficulty Swallowing, Excessive gas, Gets full quickly at meals, Hemorrhoids, Indigestion, Nausea, Rectal Pain and Vomiting. Male Genitourinary Not Present- Blood in Urine and Change in Urinary Stream. Musculoskeletal Not Present- Back Pain and Backache. Neurological Not Present- Decreased Memory and Difficulty Speaking. Psychiatric Not Present- Anxiety and Depression. Endocrine Not Present- Appetite Changes and New Diabetes. Hematology Not Present- Easy Bleeding and Easy Bruising.  Vitals (Tanisha A. Brown RMA; 08/07/2018 2:12 PM) 08/07/2018 2:11 PM Weight: 192.4 lb Height: 69in Body Surface Area: 2.03 m Body Mass Index: 28.41 kg/m  Temp.: 98.66F  Pulse: 88 (Regular)  BP: 126/82 (Sitting, Left Arm, Standard)       Physical Exam Craig Gave M. Laken Lobato MD; 08/07/2018 3:24 PM) The physical exam findings are as follows: Note:Constitutional: No acute distress; conversant; no deformities Eyes: Moist conjunctiva; no lid lag; anicteric sclerae; pupils equal round and reactive to light Neck: Trachea midline; no palpable thyromegaly Lungs: Normal respiratory effort; no tactile  fremitus CV: Regular rate and rhythm; no palpable thrill; no pitting edema GI: Abdomen soft, nontender, nondistended; no palpable hepatosplenomegaly; ileostomy with thick stool in appliance Anorectal: DRE - good tone and squeeze. Anastomosis palpable and patent. No palpable masses. No clear palpable defects MSK: Normal gait; no clubbing/cyanosis Psychiatric: Appropriate affect; alert and oriented 3 Lymphatic: No palpable cervical or axillary lymphadenopathy    Assessment & Plan Craig Gave M. Sixto Bowdish MD; 08/07/2018 3:27 PM) RECTAL CANCER (C20) Story: 35M with low rectal cancer now s/p neoadjuvant chemoXRT with Xeloda and surgery 3/20 - lap ultralow AR with DLI and double stapled coloanal anastomosis - 1.5cm distal margin but this was below the mesorectal margin (bare area) - subsequent anastomotic leak controlled with ileostomy and drains, resolved clinically and they were ultimately removed. Path ypT3N0 (0/6) M0; complete TME with uninvolved CRM. Completed adjuvant Xeloda 07/18/18 Impression: -Will schedule for repeat CT chest abdomen and pelvis with IV contrast-this will serve as restaging and additionally to assess the presacral sinus he had. -Gastrografin enema via anus -We'll also schedule for flexible sigmoidoscopy for anastomotic evaluation endoscopically. -We will have him return the office following all these tests for follow-up Current Plans CT ABDOMEN AND PELVIS (20813) (I.V. CONTRAST) GASTROGRAFIN ENEMA (88719) (ENEMA THROUGH RECTUM; PATIENT HAS ILEOSTOMY) Signed electronically by Ileana Roup, MD (08/07/2018 3:27 PM)

## 2018-08-09 ENCOUNTER — Other Ambulatory Visit (HOSPITAL_COMMUNITY): Payer: Self-pay | Admitting: Surgery

## 2018-08-09 DIAGNOSIS — C20 Malignant neoplasm of rectum: Secondary | ICD-10-CM

## 2018-08-11 ENCOUNTER — Other Ambulatory Visit: Payer: Self-pay | Admitting: Surgery

## 2018-08-11 DIAGNOSIS — C20 Malignant neoplasm of rectum: Secondary | ICD-10-CM

## 2018-08-14 ENCOUNTER — Other Ambulatory Visit: Payer: Self-pay | Admitting: Surgery

## 2018-08-14 DIAGNOSIS — C20 Malignant neoplasm of rectum: Secondary | ICD-10-CM

## 2018-08-18 ENCOUNTER — Other Ambulatory Visit: Payer: Self-pay | Admitting: *Deleted

## 2018-08-18 ENCOUNTER — Inpatient Hospital Stay: Payer: Medicaid Other | Attending: Oncology

## 2018-08-18 DIAGNOSIS — C20 Malignant neoplasm of rectum: Secondary | ICD-10-CM

## 2018-08-21 ENCOUNTER — Ambulatory Visit
Admission: RE | Admit: 2018-08-21 | Discharge: 2018-08-21 | Disposition: A | Discharge status: Home / Self Care | Payer: Medicaid Other | Source: Ambulatory Visit | Attending: Surgery | Admitting: Surgery

## 2018-08-21 DIAGNOSIS — C20 Malignant neoplasm of rectum: Secondary | ICD-10-CM

## 2018-08-21 MED ORDER — IOPAMIDOL (ISOVUE-300) INJECTION 61%
100.0000 mL | Freq: Once | INTRAVENOUS | Status: AC | PRN
  Administered 2018-08-21: 100 mL via INTRAVENOUS

## 2018-08-28 ENCOUNTER — Other Ambulatory Visit: Payer: Medicaid Other

## 2018-09-04 ENCOUNTER — Ambulatory Visit
Admission: RE | Admit: 2018-09-04 | Discharge: 2018-09-04 | Disposition: A | Discharge status: Home / Self Care | Payer: Medicaid Other | Source: Ambulatory Visit | Attending: Surgery | Admitting: Surgery

## 2018-09-04 DIAGNOSIS — C20 Malignant neoplasm of rectum: Secondary | ICD-10-CM

## 2018-09-12 ENCOUNTER — Encounter (HOSPITAL_COMMUNITY): Payer: Self-pay | Admitting: *Deleted

## 2018-09-12 ENCOUNTER — Other Ambulatory Visit: Payer: Self-pay

## 2018-09-13 ENCOUNTER — Other Ambulatory Visit: Payer: Self-pay

## 2018-09-13 ENCOUNTER — Encounter (HOSPITAL_COMMUNITY)
Admission: RE | Disposition: A | Discharge status: Home / Self Care | Payer: Self-pay | Source: Ambulatory Visit | Attending: Surgery

## 2018-09-13 ENCOUNTER — Ambulatory Visit (HOSPITAL_COMMUNITY): Payer: Medicaid Other | Admitting: Anesthesiology

## 2018-09-13 ENCOUNTER — Ambulatory Visit (HOSPITAL_COMMUNITY)
Admission: RE | Admit: 2018-09-13 | Discharge: 2018-09-13 | Disposition: A | Discharge status: Home / Self Care | Payer: Medicaid Other | Source: Ambulatory Visit | Attending: Surgery | Admitting: Surgery

## 2018-09-13 ENCOUNTER — Encounter (HOSPITAL_COMMUNITY): Payer: Self-pay | Admitting: Emergency Medicine

## 2018-09-13 DIAGNOSIS — Z801 Family history of malignant neoplasm of trachea, bronchus and lung: Secondary | ICD-10-CM | POA: Diagnosis not present

## 2018-09-13 DIAGNOSIS — F419 Anxiety disorder, unspecified: Secondary | ICD-10-CM | POA: Insufficient documentation

## 2018-09-13 DIAGNOSIS — Z87442 Personal history of urinary calculi: Secondary | ICD-10-CM | POA: Insufficient documentation

## 2018-09-13 DIAGNOSIS — Z980 Intestinal bypass and anastomosis status: Secondary | ICD-10-CM | POA: Insufficient documentation

## 2018-09-13 DIAGNOSIS — I499 Cardiac arrhythmia, unspecified: Secondary | ICD-10-CM | POA: Diagnosis not present

## 2018-09-13 DIAGNOSIS — Z806 Family history of leukemia: Secondary | ICD-10-CM | POA: Diagnosis not present

## 2018-09-13 DIAGNOSIS — Z923 Personal history of irradiation: Secondary | ICD-10-CM | POA: Insufficient documentation

## 2018-09-13 DIAGNOSIS — Z85048 Personal history of other malignant neoplasm of rectum, rectosigmoid junction, and anus: Secondary | ICD-10-CM | POA: Diagnosis not present

## 2018-09-13 DIAGNOSIS — Z87891 Personal history of nicotine dependence: Secondary | ICD-10-CM | POA: Diagnosis not present

## 2018-09-13 DIAGNOSIS — Z9221 Personal history of antineoplastic chemotherapy: Secondary | ICD-10-CM | POA: Insufficient documentation

## 2018-09-13 DIAGNOSIS — R002 Palpitations: Secondary | ICD-10-CM | POA: Insufficient documentation

## 2018-09-13 DIAGNOSIS — Z809 Family history of malignant neoplasm, unspecified: Secondary | ICD-10-CM | POA: Insufficient documentation

## 2018-09-13 DIAGNOSIS — I493 Ventricular premature depolarization: Secondary | ICD-10-CM | POA: Insufficient documentation

## 2018-09-13 DIAGNOSIS — R935 Abnormal findings on diagnostic imaging of other abdominal regions, including retroperitoneum: Secondary | ICD-10-CM | POA: Diagnosis present

## 2018-09-13 HISTORY — PX: FLEXIBLE SIGMOIDOSCOPY: SHX5431

## 2018-09-13 SURGERY — SIGMOIDOSCOPY, FLEXIBLE
Anesthesia: Monitor Anesthesia Care

## 2018-09-13 MED ORDER — PROPOFOL 500 MG/50ML IV EMUL
INTRAVENOUS | Status: DC | PRN
  Administered 2018-09-13: 15 ug/kg/min via INTRAVENOUS

## 2018-09-13 MED ORDER — PROPOFOL 10 MG/ML IV BOLUS
INTRAVENOUS | Status: DC | PRN
  Administered 2018-09-13: 40 mg via INTRAVENOUS
  Administered 2018-09-13: 20 mg via INTRAVENOUS
  Administered 2018-09-13: 40 mg via INTRAVENOUS

## 2018-09-13 MED ORDER — PROPOFOL 10 MG/ML IV BOLUS
INTRAVENOUS | Status: AC
  Filled 2018-09-13: qty 40

## 2018-09-13 MED ORDER — LACTATED RINGERS IV SOLN
INTRAVENOUS | Status: DC
  Administered 2018-09-13: 11:00:00 via INTRAVENOUS

## 2018-09-13 MED ORDER — SODIUM CHLORIDE 0.9 % IV SOLN: INTRAVENOUS | Status: DC

## 2018-09-13 NOTE — H&P (Signed)
CC: Here today for scope  HPI: Craig [name] is an 57 y.o. male  [name] is a very pleasant 57 year old male referred to me by Dr. Laural Golden for evaluation of rectal cancer and we initially met him 09/2017. He underwent his first ever colonoscopy 09/08/17. Findings were remarkable for a partially obstructing tumor in the rectum approximate 5 cm needle verge by endoscopic evaluation. This is biopsied and returned invasive adenocarcinoma. The rest of his endoscopic exam was unremarkable.  He subsequently underwent staging CT chest/abdomen/pelvis which was notable for a bulky rectal mass with perirectal nodal metastases, nonspecific right upper lobe nodule but no other evidence of distant metastatic disease.  MRI Pelvis 10/07/17 - rectal mass with shortest distance from tumor to anal sphincter being 5 cm; tumor extending on the left and extending superiorly along the course of the IMV. One enlarged pathologic appearing 1m perirectal LN. On imaging this was read as a T3N1 lesion.  He subsequently underwent neoadjuvant chemoradiation with Xeloda under the care of Dr. SBenay Spice- which he completed 11/23/17. He tolerated this therapy well and noted substantial improvement in his symptoms throughout his treatment course. He no longer had partial obstructive symptoms, was tolerating a diet without nausea or vomiting and had noted substantially improved amounts of bleeding with bowel movements. The pelvic pain and experienced has also resolved.  He went to the OR 02/01/18 for laparoscopic ultra-low anterior resection, double stapled coloanal anastomosis, takedown of splenic flexure, flex sig and diverting loop ileostomy + Cysto/stents by urology. Postoperatively he developed a leak from his coloanal anastomosis that was initially controlled with the surgical drain and IV antibiotics. He also had some smaller fluid collections near the small bowel the right abdomen that were not amenable to percutaneous drainage and were  managed with IV antibiotics. The collection in the pelvis persisted on follow-up imaging and IR was consult for placement of percutaneous drain which they did through the left gluteal approach. He did well from this. Throughout all of this, he was tolerating a diet and had no symptoms. His ileostomy output was managed on Imodium. He is also  Discharge hospital with a course of oral antibiotics-Cipro/Flagyl.  Path was reviewed with the patient  - ISOLATED FOCI OF RESIDUAL ADENOCARCINOMA INVOLVING RECTOSIGMOID COLON. SEE NOTE.  - TUMOR INVADES INTO PERICOLONIC SOFT TISSUE.  - LARGEST FOCUS OF CARCINOMA MEASURES 0.2 CM.  - NEAR COMPLETE TREATMENT RESPONSE (TUMOR REGRESSION SCORE 1).  - ALL MARGINS ARE NEGATIVE FOR CARCINOMA.  - NEGATIVE FOR LYMPHOVASCULAR OR PERINEURAL INVASION.  - SIX LYMPH NODES, NEGATIVE FOR CARCINOMA (0/6). SEE NOTE  - SEPARATE TUBULAR ADENOMA WITHOUT HIGH GRADE DYSPLASIA OR MALIGNANCY.  Donuts negative.  ypT3N0 (0/6)M0.  Repeat CT scan was obtained 02/24/18 - which demonstrated near complete resolution of the presacral fluid collection and near complete resolution of the fluid collection in the right lower quadrant and suprapubic abdominal wall. He continued his course of Cipro/Flagyl which he completed.   He completed adjuvant therapy with Xeloda 4 weeks ago. He denies any complaints today. He has been gaining weight. He denies nausea/vomiting. His stoma is working well without issues with output or dehydration. 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   Past Medical History:  Diagnosis Date  . Anxiety    Truck driver  . Dysrhythmia    palpitations  . Elevated serum creatinine    1.39 in 06/2012  . Family history of cancer   .  History of kidney stones   . Palpitations    + chest pressure; PVCs  . Rectal bleeding 01/07/2017  . Rectal cancer ([place]) 09/30/2017    Past Surgical History:  Procedure Laterality Date  .  COLONOSCOPY N/A 09/08/2017   Procedure: COLONOSCOPY;  Surgeon: Rogene Houston, MD;  Location: [facility];  Service: Endoscopy;  Laterality: N/A;  . CYSTOSCOPY WITH RETROGRADE PYELOGRAM, URETEROSCOPY AND STENT PLACEMENT Bilateral 02/01/2018   Procedure: CYSTOSCOPY WITH RETROGRADE PYELOGRAM, URETEROSCOPY AND URETERAL CATHETERS BILATERAL;  Surgeon: Alexis Frock, MD;  Location: [facility];  Service: Urology;  Laterality: Bilateral;  . LAPAROSCOPIC LOW ANTERIOR RESECTION N/A 02/01/2018   Procedure: LAPAROSCOPIC  LOW ANTERIOR RECTOSIGMOID RESECTION, COLOANAL ANASTOMOSIS;  DIVERTING LOOP ILESTOMY; FLEXIBLE SIGMOIDOSCOPY;  Surgeon: Ileana Roup, MD;  Location: [facility];  Service: General;  Laterality: N/A;    Family History  Problem Relation Age of Onset  . Cancer Mother 43       Lung cancer  . Leukemia Sister 49  . Cancer Maternal Aunt     Social:  reports that he has quit smoking. He has never used smokeless tobacco. He reports that he drinks alcohol. He reports that he does not use drugs.  Allergies: No Known Allergies  Medications: I have reviewed the patient's current medications.  No results found for this or any previous visit (from the past 48 hour(s)).  No results found.  ROS - all of the below systems have been reviewed with the patient and positives are indicated with bold text General: chills, fever or night sweats Eyes: blurry vision or double vision ENT: epistaxis or sore throat Allergy/Immunology: itchy/watery eyes or nasal congestion Hematologic/Lymphatic: bleeding problems, blood clots or swollen lymph nodes Endocrine: temperature intolerance or unexpected weight changes Breast: new or changing breast lumps or nipple discharge Resp: cough, shortness of breath, or wheezing CV: chest pain or dyspnea on exertion GI: as per HPI GU: dysuria, trouble voiding, or hematuria MSK: joint pain or joint stiffness Neuro: TIA or stroke symptoms Derm: pruritus and skin lesion  changes Psych: anxiety and depression  PE Blood pressure 133/80, pulse 82, temperature 98.9 F (37.2 C), temperature source Oral, height _0  (1.753 m), weight 86.6 kg, SpO2 98 %. Constitutional: NAD; conversant; no deformities Eyes: Moist conjunctiva; no lid lag; anicteric; PERRL Neck: Trachea midline; no thyromegaly Lungs: Normal respiratory effort; no tactile fremitus CV: RRR; no palpable thrills; no pitting edema GI: Abd soft, NT/ND; no palpable hepatosplenomegaly MSK: Normal gait; no clubbing/cyanosis Psychiatric: Appropriate affect; alert and oriented x3 Lymphatic: No palpable cervical or axillary lymphadenopathy  No results found for this or any previous visit (from the past 48 hour(s)).  No results found.  A/P: ACESON LABELL is an 57 y.o. male with hx of rectal cancer s/p LAR with DLI 02/01/18 - here today for flexible sigmoidoscopy  -He recently completed chemo - he is scheduled for colonscopy with Dr. Laural Golden in the next month -Planning flexible sigmoidoscopy today to assess anastomosis given findings on gastrografin enema suggesting an anastomotic sinus -The planned procedure, material risks (including but not limited to perforation of colon, bleeding, need for additional procedures) benefits and alternatives were discussed. His questions were answered to his satisfaction, he voiced understanding and elected to proceed  Sharon Mt. Dema Severin, M.D. General and Colorectal Surgery [facility]

## 2018-09-13 NOTE — Discharge Instructions (Signed)
Flexible Sigmoidoscopy, Care After  This sheet gives you information about how to care for yourself after your procedure. Your health care provider may also give you more specific instructions. If you have problems or questions, contact your health care provider.  What can I expect after the procedure?  After the procedure, it is common to have:  · Abdominal cramping or pain.  · Bloating.  · A small amount of rectal bleeding if you had a biopsy.    Follow these instructions at home:  · Take over-the-counter and prescription medicines only as told by your health care provider.  · Do not drive for 24 hours if you received a medicine to help you relax (sedative).  · Keep all follow-up visits as told by your health care provider. This is important.  Contact a health care provider if:  · You have abdominal pain or cramping that gets worse or is not helped with medicine.  · You continue to have small amounts of rectal bleeding after 24 hours.  · You have nausea or vomiting.  · You feel weak or dizzy.  · You have a fever.  Get help right away if:  · You pass large blood clots or see a large amount of blood in the toilet after having a bowel movement.  · You have nausea or vomiting for more than 24 hours after the procedure.  This information is not intended to replace advice given to you by your health care provider. Make sure you discuss any questions you have with your health care provider.  Document Released: 11/06/2013 Document Revised: 05/21/2016 Document Reviewed: 01/31/2016  Elsevier Interactive Patient Education © 2018 [employer]

## 2018-09-13 NOTE — Anesthesia Preprocedure Evaluation (Signed)
Anesthesia Evaluation  Patient identified by MRN, date of birth, ID band Patient awake    Reviewed: Allergy & Precautions, NPO status , Patient's Chart, lab work & pertinent test results  Airway Mallampati: I  TM Distance: >3 FB Neck ROM: Full    Dental  (+) Chipped, Poor Dentition,    Pulmonary neg shortness of breath, neg COPD, neg recent URI, former smoker,    breath sounds clear to auscultation       Cardiovascular (-) angina(-) Past MI negative cardio ROS   Rhythm:Regular     Neuro/Psych PSYCHIATRIC DISORDERS Anxiety negative neurological ROS     GI/Hepatic Neg liver ROS, History of rectal cancer     Endo/Other  negative endocrine ROS  Renal/GU Renal InsufficiencyRenal disease     Musculoskeletal negative musculoskeletal ROS (+)   Abdominal   Peds  Hematology negative hematology ROS (+)   Anesthesia Other Findings   Reproductive/Obstetrics                             Anesthesia Physical Anesthesia Plan  ASA: II  Anesthesia Plan: MAC   Post-op Pain Management:    Induction: Intravenous  PONV Risk Score and Plan: 1 and Treatment may vary due to age or medical condition  Airway Management Planned: Nasal Cannula  Additional Equipment:   Intra-op Plan:   Post-operative Plan:   Informed Consent: I have reviewed the patients History and Physical, chart, labs and discussed the procedure including the risks, benefits and alternatives for the proposed anesthesia with the patient or authorized representative who has indicated his/her understanding and acceptance.   Dental advisory given  Plan Discussed with: CRNA  Anesthesia Plan Comments:         Anesthesia Quick Evaluation

## 2018-09-13 NOTE — Transfer of Care (Signed)
Immediate Anesthesia Transfer of Care Note  Patient: Craig [name]  Procedure(s) Performed: FLEXIBLE SIGMOIDOSCOPY (N/A )  Patient Location: PACU  Anesthesia Type:MAC  Level of Consciousness: awake, alert  and oriented  Airway & Oxygen Therapy: Patient Spontanous Breathing and Patient connected to face mask oxygen  Post-op Assessment: Report given to RN and Post -op Vital signs reviewed and stable  Post vital signs: Reviewed and stable  Last Vitals:  Vitals Value Taken Time  BP    Temp    Pulse 73 09/13/2018  1:27 PM  Resp 22 09/13/2018  1:27 PM  SpO2 100 % 09/13/2018  1:27 PM  Vitals shown include unvalidated device data.  Last Pain:  Vitals:   09/13/18 1110  TempSrc: Oral         Complications: No apparent anesthesia complications

## 2018-09-13 NOTE — Op Note (Signed)
[facility] Patient Name: Craig [name] Procedure Date: 09/13/2018 MRN: [id number] Attending MD: Ileana Roup MD, MD Date of Birth: 1961/01/05 CSN: [id number] Age: 57 Admit Type: Outpatient Procedure:                Flexible Sigmoidoscopy Indications:              Abnormal barium enema Providers:                Sharon Mt. Cing Foscoe MD, MD, Cleda Daub, RN,                            Tinnie Gens, Technician Referring MD:              Medicines:                Monitored Anesthesia Care Complications:            No immediate complications. Estimated Blood Loss:     Estimated blood loss was minimal. Procedure:                Pre-Anesthesia Assessment:                           - Prior to the procedure, a History and Physical                            was performed, and patient medications, allergies                            and sensitivities were reviewed. The patient's                            tolerance of previous anesthesia was reviewed.                           - The risks and benefits of the procedure and the                            sedation options and risks were discussed with the                            patient. All questions were answered and informed                            consent was obtained.                           - Patient identification and proposed procedure                            were verified prior to the procedure by the                            physician, the nurse and the anesthetist. The                            procedure was verified in  the pre-procedure area in                            the procedure room.                           After obtaining informed consent, the scope was                            passed under direct vision. The GIF-H190 ([id number])                            Olympus adult endoscope was introduced through the                            anus and advanced to the the left transverse colon.                          The flexible sigmoidoscopy was accomplished without                            difficulty. The patient tolerated the procedure                            well. The quality of the bowel preparation was                            adequate. Scope In: Scope Out: Findings:      A post-surgical anastomosis was found on digital exam. This was found to       be patent. Pertinent negatives include normal sphincter tone.      There was evidence of a prior end-to-end colo-colonic anastomosis in the       mid rectum. This was patent and was characterized by an intact staple       line - granulation tissue posteriorly at point of abnormal GGE. The       anastomosis was traversed. Estimated blood loss was minimal.      The exam was otherwise without abnormality. Impression:               - Patent post-surgical anastomosis found on digital                            exam.                           - Patent end-to-end colo-colonic anastomosis,                            characterized by an intact staple line -                            granulation tissue posteriorly at point of abnormal                            GGE - consistent with entrance to sinus                           -  The examination was otherwise normal.                           - No specimens collected. Moderate Sedation:      Not Applicable - Patient had care per Anesthesia. Recommendation:           - Continue present medications.                           - Resume previous diet. Procedure Code(s):        --- Professional ---                           [phone], Sigmoidoscopy, flexible; diagnostic,                            including collection of specimen(s) by brushing or                            washing, when performed (separate procedure) Diagnosis Code(s):        --- Professional ---                           R93.3, Abnormal findings on diagnostic imaging of                            other parts of  digestive tract                           Z98.0, Intestinal bypass and anastomosis status CPT copyright 2018 American Medical Association. All rights reserved. The codes documented in this report are preliminary and upon coder review may  be revised to meet current compliance requirements. Nadeen Landau, MD Ileana Roup MD, MD 09/13/2018 1:32:01 PM This report has been signed electronically. Number of Addenda: 0

## 2018-09-14 ENCOUNTER — Encounter (HOSPITAL_COMMUNITY): Payer: Self-pay | Admitting: Surgery

## 2018-09-26 NOTE — Anesthesia Postprocedure Evaluation (Signed)
Anesthesia Post Note  Patient: Craig [name]  Procedure(s) Performed: FLEXIBLE SIGMOIDOSCOPY (N/A )     Patient location during evaluation: Endoscopy Anesthesia Type: MAC Level of consciousness: awake and alert Pain management: pain level controlled Vital Signs Assessment: post-procedure vital signs reviewed and stable Respiratory status: spontaneous breathing, nonlabored ventilation, respiratory function stable and patient connected to nasal cannula oxygen Cardiovascular status: stable and blood pressure returned to baseline Postop Assessment: no apparent nausea or vomiting Anesthetic complications: no    Last Vitals:  Vitals:   09/13/18 1350 09/13/18 1400  BP: 106/65 126/89  Pulse: 66 64  Resp: 18 15  Temp:    SpO2: 98% 98%    Last Pain:  Vitals:   09/14/18 1537  TempSrc:   PainSc: 0-No pain                 Lilliann Rossetti

## 2018-10-05 ENCOUNTER — Other Ambulatory Visit (INDEPENDENT_AMBULATORY_CARE_PROVIDER_SITE_OTHER): Payer: Self-pay | Admitting: *Deleted

## 2018-10-05 DIAGNOSIS — Z85048 Personal history of other malignant neoplasm of rectum, rectosigmoid junction, and anus: Secondary | ICD-10-CM

## 2018-10-18 ENCOUNTER — Encounter (INDEPENDENT_AMBULATORY_CARE_PROVIDER_SITE_OTHER): Payer: Self-pay | Admitting: *Deleted

## 2018-10-18 ENCOUNTER — Telehealth (INDEPENDENT_AMBULATORY_CARE_PROVIDER_SITE_OTHER): Payer: Self-pay | Admitting: *Deleted

## 2018-10-18 NOTE — Telephone Encounter (Signed)
Patient needs suprep 

## 2018-10-19 MED ORDER — SUPREP BOWEL PREP KIT 17.5-3.13-1.6 GM/177ML PO SOLN: 1.0000 | Freq: Once | ORAL | 0 refills | Status: AC

## 2018-10-30 ENCOUNTER — Telehealth: Payer: Self-pay | Admitting: Nurse Practitioner

## 2018-10-30 ENCOUNTER — Inpatient Hospital Stay: Payer: BLUE CROSS/BLUE SHIELD | Attending: Oncology

## 2018-10-30 ENCOUNTER — Inpatient Hospital Stay (HOSPITAL_BASED_OUTPATIENT_CLINIC_OR_DEPARTMENT_OTHER): Payer: BLUE CROSS/BLUE SHIELD | Admitting: Nurse Practitioner

## 2018-10-30 ENCOUNTER — Encounter: Payer: Self-pay | Admitting: Nurse Practitioner

## 2018-10-30 VITALS — BP 132/94 | HR 74 | Temp 98.5°F | Resp 17 | Ht 69.0 in | Wt 206.8 lb

## 2018-10-30 DIAGNOSIS — Z85048 Personal history of other malignant neoplasm of rectum, rectosigmoid junction, and anus: Secondary | ICD-10-CM

## 2018-10-30 DIAGNOSIS — C20 Malignant neoplasm of rectum: Secondary | ICD-10-CM

## 2018-10-30 LAB — CBC WITH DIFFERENTIAL (CANCER CENTER ONLY)
Abs Immature Granulocytes: 0.01 10*3/uL (ref 0.00–0.07)
BASOS PCT: 1 %
Basophils Absolute: 0 10*3/uL (ref 0.0–0.1)
EOS ABS: 0.2 10*3/uL (ref 0.0–0.5)
Eosinophils Relative: 6 %
HCT: 47 % (ref 39.0–52.0)
Hemoglobin: 16.2 g/dL (ref 13.0–17.0)
IMMATURE GRANULOCYTES: 0 %
Lymphocytes Relative: 18 %
Lymphs Abs: 0.5 10*3/uL — ABNORMAL LOW (ref 0.7–4.0)
MCH: 31.6 pg (ref 26.0–34.0)
MCHC: 34.5 g/dL (ref 30.0–36.0)
MCV: 91.6 fL (ref 80.0–100.0)
MONOS PCT: 13 %
Monocytes Absolute: 0.3 10*3/uL (ref 0.1–1.0)
NEUTROS ABS: 1.6 10*3/uL — AB (ref 1.7–7.7)
NEUTROS PCT: 62 %
PLATELETS: 203 10*3/uL (ref 150–400)
RBC: 5.13 MIL/uL (ref 4.22–5.81)
RDW: 12.3 % (ref 11.5–15.5)
WBC Count: 2.5 10*3/uL — ABNORMAL LOW (ref 4.0–10.5)
nRBC: 0 % (ref 0.0–0.2)

## 2018-10-30 LAB — BASIC METABOLIC PANEL - CANCER CENTER ONLY
ANION GAP: 10 (ref 5–15)
BUN: 16 mg/dL (ref 6–20)
CALCIUM: 9.5 mg/dL (ref 8.9–10.3)
CO2: 22 mmol/L (ref 22–32)
Chloride: 109 mmol/L (ref 98–111)
Creatinine: 1.33 mg/dL — ABNORMAL HIGH (ref 0.61–1.24)
GFR, Est AFR Am: >60 mL/min (ref >60)
GFR, Estimated: 59 mL/min — ABNORMAL LOW (ref >60)
GLUCOSE: 121 mg/dL — AB (ref 70–99)
POTASSIUM: 4.3 mmol/L (ref 3.5–5.1)
Sodium: 141 mmol/L (ref 135–145)

## 2018-10-30 LAB — CEA (IN HOUSE-CHCC): CEA (CHCC-IN HOUSE): 1.73 ng/mL (ref 0.00–5.00)

## 2018-10-30 NOTE — Progress Notes (Signed)
  [facility] OFFICE PROGRESS NOTE   Diagnosis: Rectal cancer  INTERVAL HISTORY:   Mr. Grieshop returns as scheduled.  Feels well.  He has a good appetite.  He denies pain.  He denies nausea.  No mouth sores.  No diarrhea.  He reports he is scheduled for a colonoscopy next month.  Objective:  Vital signs in last 24 hours:  Blood pressure (!) 132/94, pulse 74, temperature 98.5 F (36.9 C), temperature source Oral, resp. rate 17, height 5\' 9"  (1.753 m), weight 206 lb 12.8 oz (93.8 kg), SpO2 98 %.    HEENT: Neck without mass. Lymphatics: No palpable cervical, supraclavicular, axillary or inguinal lymph nodes. Resp: Lungs clear bilaterally. Cardio: Regular rate and rhythm. GI: Abdomen soft and nontender.  No hepatomegaly.  Right abdomen ileostomy. Vascular: No leg edema.    Lab Results:  Lab Results  Component Value Date   WBC 2.5 (L) 10/30/2018   HGB 16.2 10/30/2018   HCT 47.0 10/30/2018   MCV 91.6 10/30/2018   PLT 203 10/30/2018   NEUTROABS 1.6 (L) 10/30/2018    Imaging:  No results found.  Medications: I have reviewed the patient's current medications.  Assessment/Plan: 1. Rectal cancer ? Colonoscopy 09/08/2017, rectal mass at 5 cm from the anal verge, biopsy confirmed invasive adenocarcinoma ? Staging CTs 09/15/2017, rectal mass and perirectal lymphadenopathy, indeterminate right upper lobe nodule ? Elevated CEA ? MRI pelvis 10/07/2017-5.3 cm circumferential lower rectal mass with perirectal extension along the inferior mesenteric vein;T3, N1; distance from tumor to the anal sphincter is 5 cm. ? Initiation of radiation and concurrent Xeloda chemotherapy 10/12/2017;completion of radiation/Xeloda 11/23/2017. ? Surgery 02/01/18 per Dr. Nadeen Landau; laparoscopic lower anterior rectosigmoid resection, coloanal anastomosis, diverting loop ileostomy, flexible sigmoidoscopy; ypT3, pN0 ? Developed low-grade fever and abdominal pain on POD 4, CT [facility] on  02/06/18 and 02/09/18 shows small loculated fluid collections in RLQ and presacral space, consulted IR for transgluteal presacral abscess drain, treated with IV zosyn then transitioned to po cipro and flagyl ? Follow-up CT 03/24/2018-2.2 x 4.9 x 4.0 cm thick-walled presacral gas and fluid collection/abscess, minimally increased status post removal of surgical drain. No evidence of recurrent or metastatic disease. ? Cycle 1 adjuvant Xeloda 04/12/2018 ? Cycle 2 adjuvant Xeloda 05/03/2018 ? Cycle 3 adjuvant Xeloda 06/05/2018 (Xeloda dose reduced to 1500 mg twice daily secondary to mild neutropenia) ? Cycle 4adjuvant Xeloda 06/26/2018 ? Cycle 5 adjuvant Xeloda 07/18/2018 ? CTs 08/21/2018- no evidence of tumor recurrence or metastatic disease in the chest, abdomen or pelvis.  Presacral soft tissue thickening and some calcifications compatible with postradiation changes.  Stable probably benign 3 mm right upper lobe nodule.  2.Pain and bleeding secondary to #1.Resolved.  3.Skin breakdown at the gluteal fold secondary to radiation. Resolved.  4. History ofneutropenia secondary to chemotherapy    Disposition: Mr. Vasek remains in clinical remission from rectal cancer.  We will follow-up on the CEA from today.  He will return for a CEA and follow-up visit in 4 months.  He will contact the office in the interim with any problems.  Plan reviewed with Dr. Benay Spice.    Ned Card ANP/GNP-BC   10/30/2018  11:26 AM

## 2018-10-30 NOTE — Telephone Encounter (Signed)
Scheduled appt per 12/16 los - gave patient AVS and calender per los.

## 2018-10-31 ENCOUNTER — Telehealth: Payer: Self-pay | Admitting: Emergency Medicine

## 2018-10-31 NOTE — Telephone Encounter (Addendum)
VM left regarding this note   ----- Message from Owens Shark, NP sent at 10/30/2018  4:30 PM EST ----- Please let him know the CEA is normal.

## 2018-11-14 ENCOUNTER — Telehealth (INDEPENDENT_AMBULATORY_CARE_PROVIDER_SITE_OTHER): Payer: Self-pay | Admitting: *Deleted

## 2018-11-14 NOTE — Telephone Encounter (Signed)
agree

## 2018-11-14 NOTE — Telephone Encounter (Signed)
Referring MD/PCP: no PCP per patient   Procedure: tcs  Reason/Indication:  Hx rectal cancer  Has patient had this procedure before?  Yes, 2018  If so, when, by whom and where?    Is there a family history of colon cancer?  no  Who?  What age when diagnosed?    Is patient diabetic?   no      Does patient have prosthetic heart valve or mechanical valve?  no  Do you have a pacemaker?  no  Has patient ever had endocarditis? no  Has patient had joint replacement within last 12 months?  no  Is patient constipated or do they take laxatives? no  Does patient have a history of alcohol/drug use?  no  Is patient on blood thinner such as Coumadin, Plavix and/or Aspirin? no  Medications: none  Allergies: nkda  Medication Adjustment per Dr Lindi Adie, NP:   Procedure date & time: 12/14/18 at 830

## 2018-12-14 ENCOUNTER — Other Ambulatory Visit: Payer: Self-pay

## 2018-12-14 ENCOUNTER — Encounter (HOSPITAL_COMMUNITY)
Admission: RE | Disposition: A | Discharge status: Home / Self Care | Payer: Self-pay | Source: Ambulatory Visit | Attending: Internal Medicine

## 2018-12-14 ENCOUNTER — Ambulatory Visit (HOSPITAL_COMMUNITY)
Admission: RE | Admit: 2018-12-14 | Discharge: 2018-12-14 | Disposition: A | Discharge status: Home / Self Care | Payer: BLUE CROSS/BLUE SHIELD | Source: Ambulatory Visit | Attending: Internal Medicine | Admitting: Internal Medicine

## 2018-12-14 ENCOUNTER — Encounter (HOSPITAL_COMMUNITY): Payer: Self-pay | Admitting: *Deleted

## 2018-12-14 DIAGNOSIS — Z85048 Personal history of other malignant neoplasm of rectum, rectosigmoid junction, and anus: Secondary | ICD-10-CM | POA: Insufficient documentation

## 2018-12-14 DIAGNOSIS — Z79899 Other long term (current) drug therapy: Secondary | ICD-10-CM | POA: Diagnosis not present

## 2018-12-14 DIAGNOSIS — Z932 Ileostomy status: Secondary | ICD-10-CM | POA: Insufficient documentation

## 2018-12-14 DIAGNOSIS — Z923 Personal history of irradiation: Secondary | ICD-10-CM | POA: Insufficient documentation

## 2018-12-14 DIAGNOSIS — Z08 Encounter for follow-up examination after completed treatment for malignant neoplasm: Secondary | ICD-10-CM | POA: Insufficient documentation

## 2018-12-14 DIAGNOSIS — K219 Gastro-esophageal reflux disease without esophagitis: Secondary | ICD-10-CM | POA: Diagnosis not present

## 2018-12-14 DIAGNOSIS — Z87891 Personal history of nicotine dependence: Secondary | ICD-10-CM | POA: Diagnosis not present

## 2018-12-14 DIAGNOSIS — F419 Anxiety disorder, unspecified: Secondary | ICD-10-CM | POA: Diagnosis not present

## 2018-12-14 DIAGNOSIS — Z9221 Personal history of antineoplastic chemotherapy: Secondary | ICD-10-CM | POA: Insufficient documentation

## 2018-12-14 DIAGNOSIS — Z87442 Personal history of urinary calculi: Secondary | ICD-10-CM | POA: Insufficient documentation

## 2018-12-14 HISTORY — DX: Gastro-esophageal reflux disease without esophagitis: K21.9

## 2018-12-14 HISTORY — PX: COLONOSCOPY: SHX5424

## 2018-12-14 SURGERY — COLONOSCOPY
Anesthesia: Moderate Sedation

## 2018-12-14 MED ORDER — SODIUM CHLORIDE 0.9 % IV SOLN
INTRAVENOUS | Status: DC
  Administered 2018-12-14: 09:00:00 via INTRAVENOUS

## 2018-12-14 MED ORDER — MIDAZOLAM HCL 5 MG/5ML IJ SOLN
INTRAMUSCULAR | Status: AC
  Filled 2018-12-14: qty 10

## 2018-12-14 MED ORDER — MIDAZOLAM HCL 5 MG/5ML IJ SOLN
INTRAMUSCULAR | Status: DC | PRN
  Administered 2018-12-14 (×3): 2 mg via INTRAVENOUS

## 2018-12-14 MED ORDER — MEPERIDINE HCL 50 MG/ML IJ SOLN
INTRAMUSCULAR | Status: AC
  Filled 2018-12-14: qty 1

## 2018-12-14 MED ORDER — MEPERIDINE HCL 50 MG/ML IJ SOLN
INTRAMUSCULAR | Status: DC | PRN
  Administered 2018-12-14 (×3): 25 mg

## 2018-12-14 NOTE — H&P (Addendum)
Craig [name] is an 58 y.o. male.   Chief Complaint: Patient is here for colonoscopy. HPI: Patient is 58 year old Afro-American male who was diagnosed with rectal carcinoma in October 2018.  He underwent preop chemo radiation therapy followed by laparoscopic low anterior resection and ileostomy.  He developed anastomotic leak treated with antibiotics.  Since then he has had scant rectal drainage.  It is small in amount and is almost clear.  He does not have abdominal pain.  He is not having any problems with his ileostomy either.  He is looking forward to when it would be reversed.  He has not noted any bleeding or output change into ileostomy.  He denies abdominal pain or rectal bleeding. He had flexible sigmoidoscopy by Dr. Nadeen Landau in October 2019 and he was felt to have granulation tissue at staple line which was felt to be entry point of the sinus. Family history is negative for CRC but positive for known GI malignancies.  Past Medical History:  Diagnosis Date  . Anxiety    Truck driver  . Dysrhythmia    palpitations  . Elevated serum creatinine    1.39 in 06/2012  . Family history of cancer   . GERD (gastroesophageal reflux disease)   . History of kidney stones   . Palpitations    + chest pressure; PVCs  . Rectal bleeding 01/07/2017  . Rectal cancer ([facility]) 09/30/2017    Past Surgical History:  Procedure Laterality Date  . COLONOSCOPY N/A 09/08/2017   Procedure: COLONOSCOPY;  Surgeon: Rogene Houston, MD;  Location: [facility];  Service: Endoscopy;  Laterality: N/A;  . CYSTOSCOPY WITH RETROGRADE PYELOGRAM, URETEROSCOPY AND STENT PLACEMENT Bilateral 02/01/2018   Procedure: CYSTOSCOPY WITH RETROGRADE PYELOGRAM, URETEROSCOPY AND URETERAL CATHETERS BILATERAL;  Surgeon: Alexis Frock, MD;  Location: [facility];  Service: Urology;  Laterality: Bilateral;  . FLEXIBLE SIGMOIDOSCOPY N/A 09/13/2018   Procedure: FLEXIBLE SIGMOIDOSCOPY;  Surgeon: Ileana Roup, MD;  Location: [facility];  Service: General;  Laterality: N/A;  . LAPAROSCOPIC LOW ANTERIOR RESECTION N/A 02/01/2018   Procedure: LAPAROSCOPIC  LOW ANTERIOR RECTOSIGMOID RESECTION, COLOANAL ANASTOMOSIS;  DIVERTING LOOP ILESTOMY; FLEXIBLE SIGMOIDOSCOPY;  Surgeon: Ileana Roup, MD;  Location: [facility];  Service: General;  Laterality: N/A;    Family History  Problem Relation Age of Onset  . Cancer Mother 55       Lung cancer  . Leukemia Sister 42  . Cancer Maternal Aunt    Social History:  reports that he has quit smoking. He has never used smokeless tobacco. He reports current alcohol use. He reports that he does not use drugs.  Allergies: No Known Allergies  Medications Prior to Admission  Medication Sig Dispense Refill  . diphenhydrAMINE (SOMINEX) 25 MG tablet Take 50 mg by mouth at bedtime as needed for sleep.     . naphazoline-glycerin (CLEAR EYES REDNESS) 0.012-0.2 % SOLN Place 1-2 drops into both eyes 4 (four) times daily as needed for eye irritation.     Marland Kitchen acetaminophen (TYLENOL) 500 MG tablet Take 1,000 mg by mouth daily as needed for moderate pain or headache.    Marland Kitchen oxymetazoline (AFRIN) 0.05 % nasal spray Place 1 spray into both nostrils daily as needed for congestion.      No results found for this or any previous visit (from the past 48 hour(s)). No results found.  ROS  Blood pressure (!) 136/104, pulse 93, temperature 99 F (37.2 C), temperature source Oral, resp. rate 12, height 5\' 9"  (  1.753 m), weight 90.3 kg, SpO2 95 %. Physical Exam  Constitutional: He appears well-developed and well-nourished.  HENT:  Mouth/Throat: Oropharynx is clear and moist.  Eyes: Conjunctivae are normal. No scleral icterus.  Neck: No thyromegaly present.  Cardiovascular: Normal rate, regular rhythm and normal heart sounds.  No murmur heard. Respiratory: Effort normal and breath sounds normal.  GI:  He has small infraumbilical scar from previous laparoscopy.  He has ileostomy in right lower  quadrant.  Ileostomy bag is yellowish-brown thick liquid stool.  Abdomen is soft and nontender with organomegaly or masses.  Musculoskeletal:        General: No edema.  Lymphadenopathy:    He has no cervical adenopathy.  Neurological: He is alert.  Skin: Skin is warm and dry.     Assessment/Plan History of rectal carcinoma. Status post chemoradiation followed by low anterior resection and ileostomy. Anastomotic leak and subsequently developed sinus which is slowly healing. Surveillance colonoscopy.  Hildred Laser, MD 12/14/2018, 9:34 AM

## 2018-12-14 NOTE — Op Note (Signed)
[facility] Patient Name: Craig [name] Procedure Date: 12/14/2018 9:25 AM MRN: [id number] Date of Birth: May 04, 1961 Attending MD: Hildred Laser , MD CSN: [id number] Age: 58 Admit Type: Outpatient Procedure:                Colonoscopy Indications:              High risk colon cancer surveillance: Personal                            history of rectal cancer Providers:                Hildred Laser, MD, Janeece Riggers, RN, Nelma Rothman,                            Technician Referring MD:             Sharon Mt. White MD, MD Medicines:                Meperidine 75 mg IV, Midazolam 6 mg IV Complications:            No immediate complications. Estimated Blood Loss:     Estimated blood loss: none. Procedure:                Pre-Anesthesia Assessment:                           - Prior to the procedure, a History and Physical                            was performed, and patient medications and                            allergies were reviewed. The patient's tolerance of                            previous anesthesia was also reviewed. The risks                            and benefits of the procedure and the sedation                            options and risks were discussed with the patient.                            All questions were answered, and informed consent                            was obtained. Prior Anticoagulants: The patient has                            taken no previous anticoagulant or antiplatelet                            agents. ASA Grade Assessment: I - A normal, healthy  patient. After reviewing the risks and benefits,                            the patient was deemed in satisfactory condition to                            undergo the procedure.                           After obtaining informed consent, the colonoscope                            was passed under direct vision. Throughout the                            procedure, the  patient's blood pressure, pulse, and                            oxygen saturations were monitored continuously. The                            [id number]-H190DL ([id number] scope was introduced through                            the anus and advanced to the the cecum, identified                            by appendiceal orifice and ileocecal valve. The                            colonoscopy was performed without difficulty. The                            patient tolerated the procedure well. The quality                            of the bowel preparation was adequate. The                            ileocecal valve, appendiceal orifice, and rectum                            were photographed. Scope In: 9:46:01 AM Scope Out: 10:00:33 AM Scope Withdrawal Time: 0 hours 8 minutes 6 seconds  Total Procedure Duration: 0 hours 14 minutes 32 seconds  Findings:      The perianal examination was normal.      The digital rectal exam revealed patent rectal anastamosis noted on       digital exam.      A small amount of stool was found in the cecum, without interference to       visualization. Inspissated mucus noted colon.      The sigmoid colon, descending colon, splenic flexure, transverse colon,       hepatic flexure, ascending colon and cecum appeared normal.      There was evidence of  a prior end-to-end colo-rectal anastomosis in the       distal rectum. This was patent and was characterized by an intact staple       line and the presence of no stomal ulceration. The anastomosis was       traversed. Impression:               - Stool in the cecum.                           - The sigmoid colon, descending colon, splenic                            flexure, transverse colon, hepatic flexure,                            ascending colon and cecum are normal.                           - Patent end-to-end colo-rectal anastomosis,                            characterized by single stapleand scar but no                             ulceration opening or granulation tissue.                            Anastamosis located 3 cm proximal to anastamosis.                           - No specimens collected. Moderate Sedation:      Moderate (conscious) sedation was administered by the endoscopy nurse       and supervised by the endoscopist. The following parameters were       monitored: oxygen saturation, heart rate, blood pressure, CO2       capnography and response to care. Total physician intraservice time was       19 minutes. Recommendation:           - Patient has a contact number available for                            emergencies. The signs and symptoms of potential                            delayed complications were discussed with the                            patient. Return to normal activities tomorrow.                            Written discharge instructions were provided to the                            patient.                           -  Resume previous diet today.                           - Continue present medications.                           - Repeat colonoscopy in 3 years for surveillance.                           _ Follwup with Dr. Nadeen Landau to have                            ileastomy reversed. Procedure Code(s):        --- Professional ---                           [phone], Colonoscopy, flexible; diagnostic, including                            collection of specimen(s) by brushing or washing,                            when performed (separate procedure)                           G0500, Moderate sedation services provided by the                            same physician or other qualified health care                            professional performing a gastrointestinal                            endoscopic service that sedation supports,                            requiring the presence of an independent trained                            observer to assist in the  monitoring of the                            patient's level of consciousness and physiological                            status; initial 15 minutes of intra-service time;                            patient age 2 years or older (additional time may                            be reported with [phone], as appropriate) Diagnosis Code(s):        --- Professional ---  Z85.048, Personal history of other malignant                            neoplasm of rectum, rectosigmoid junction, and anus CPT copyright 2018 American Medical Association. All rights reserved. The codes documented in this report are preliminary and upon coder review may  be revised to meet current compliance requirements. Hildred Laser, MD Hildred Laser, MD 12/14/2018 10:26:09 AM This report has been signed electronically. Number of Addenda: 0

## 2018-12-14 NOTE — Discharge Instructions (Signed)
Resume usual medications and diet as before. No driving for 24 hours. Next colonoscopy in 3 years. Follow-up with Dr. Nadeen Landau as planned.   Colonoscopy, Adult, Care After This sheet gives you information about how to care for yourself after your procedure. Your doctor may also give you more specific instructions. If you have problems or questions, call your doctor. What can I expect after the procedure? After the procedure, it is common to have:  A small amount of blood in your poop for 24 hours.  Some gas.  Mild cramping or bloating in your belly. Follow these instructions at home: General instructions  For the first 24 hours after the procedure: ? Do not drive or use machinery. ? Do not sign important documents. ? Do not drink alcohol. ? Do your daily activities more slowly than normal. ? Eat foods that are soft and easy to digest.  Take over-the-counter or prescription medicines only as told by your doctor. To help cramping and bloating:   Try walking around.  Put heat on your belly (abdomen) as told by your doctor. Use a heat source that your doctor recommends, such as a moist heat pack or a heating pad. ? Put a towel between your skin and the heat source. ? Leave the heat on for 20-30 minutes. ? Remove the heat if your skin turns bright red. This is especially important if you cannot feel pain, heat, or cold. You can get burned. Eating and drinking   Drink enough fluid to keep your pee (urine) clear or pale yellow.  Return to your normal diet as told by your doctor. Avoid heavy or fried foods that are hard to digest.  Avoid drinking alcohol for as long as told by your doctor. Contact a doctor if:  You have blood in your poop (stool) 2-3 days after the procedure. Get help right away if:  You have more than a small amount of blood in your poop.  You see large clumps of tissue (blood clots) in your poop.  Your belly is swollen.  You feel sick to your  stomach (nauseous).  You throw up (vomit).  You have a fever.  You have belly pain that gets worse, and medicine does not help your pain. Summary  After the procedure, it is common to have a small amount of blood in your poop. You may also have mild cramping and bloating in your belly.  For the first 24 hours after the procedure, do not drive or use machinery, do not sign important documents, and do not drink alcohol.  Get help right away if you have a lot of blood in your poop, feel sick to your stomach, have a fever, or have more belly pain. This information is not intended to replace advice given to you by your health care provider. Make sure you discuss any questions you have with your health care provider. Document Released: 12/04/2010 Document Revised: 09/01/2017 Document Reviewed: 07/26/2016 Elsevier Interactive Patient Education  2019 [employer].

## 2018-12-14 NOTE — OR Nursing (Signed)
Craig [name] was at [facility] on 12/14/18 for a procedure and cannot return to work until 11:00 am on 12/15/18.

## 2018-12-20 ENCOUNTER — Encounter (HOSPITAL_COMMUNITY): Payer: Self-pay | Admitting: Internal Medicine

## 2018-12-27 ENCOUNTER — Other Ambulatory Visit: Payer: Self-pay | Admitting: Surgery

## 2018-12-27 DIAGNOSIS — C20 Malignant neoplasm of rectum: Secondary | ICD-10-CM

## 2019-01-02 ENCOUNTER — Ambulatory Visit
Admission: RE | Admit: 2019-01-02 | Discharge: 2019-01-02 | Disposition: A | Discharge status: Home / Self Care | Payer: BLUE CROSS/BLUE SHIELD | Source: Ambulatory Visit | Attending: Surgery | Admitting: Surgery

## 2019-01-02 DIAGNOSIS — C20 Malignant neoplasm of rectum: Secondary | ICD-10-CM

## 2019-02-01 ENCOUNTER — Ambulatory Visit: Payer: Self-pay | Admitting: Surgery

## 2019-02-01 NOTE — H&P (Signed)
CC: Postop f/u  PRIOR HISTORY: Mr. Shepherd is a very pleasant 58 year old male referred to me by Dr. Laural Golden for evaluation of rectal cancer and we initially met him 09/2017. He underwent his first ever colonoscopy 09/08/17. Findings were remarkable for a partially obstructing tumor in the rectum approximate 5 cm needle verge by endoscopic evaluation. This is biopsied and returned invasive adenocarcinoma. The rest of his endoscopic exam was unremarkable.  He subsequently underwent staging CT chest/abdomen/pelvis which was notable for a bulky rectal mass with perirectal nodal metastases, nonspecific right upper lobe nodule but no other evidence of distant metastatic disease.  MRI Pelvis 10/07/17 - rectal mass with shortest distance from tumor to anal sphincter being 5 cm; tumor extending on the left and extending superiorly along the course of the IMV. One enlarged pathologic appearing 58m perirectal LN. On imaging this was read as a T3N1 lesion.  He subsequently underwent neoadjuvant chemoradiation with Xeloda under the care of Dr. SBenay Spice- which he completed 11/23/17. He tolerated this therapy well and noted substantial improvement in his symptoms throughout his treatment course. He no longer had partial obstructive symptoms, was tolerating a diet without nausea or vomiting and had noted substantially improved amounts of bleeding with bowel movements. The pelvic pain and experienced has also resolved.  He went to the OR 02/01/18 for laparoscopic ultra-low anterior resection, double stapled coloanal anastomosis, takedown of splenic flexure, flex sig and diverting loop ileostomy + Cysto/stents by urology. Postoperatively he developed a leak from his coloanal anastomosis that was initially controlled with the surgical drain and IV antibiotics. He also had some smaller fluid collections near the small bowel the right abdomen that were not amenable to percutaneous drainage and were managed with IV  antibiotics. The collection in the pelvis persisted on follow-up imaging and IR was consult for placement of percutaneous drain which they did through the left gluteal approach. He did well from this. Throughout all of this, he was tolerating a diet and had no symptoms. His ileostomy output was managed on Imodium. He is also Discharge hospital with a course of oral antibiotics-Cipro/Flagyl.   Path was reviewed with the patient - ISOLATED FOCI OF RESIDUAL ADENOCARCINOMA INVOLVING RECTOSIGMOID COLON. SEE NOTE. - TUMOR INVADES INTO PERICOLONIC SOFT TISSUE. - LARGEST FOCUS OF CARCINOMA MEASURES 0.2 CM. - NEAR COMPLETE TREATMENT RESPONSE (TUMOR REGRESSION SCORE 1). - ALL MARGINS ARE NEGATIVE FOR CARCINOMA. - NEGATIVE FOR LYMPHOVASCULAR OR PERINEURAL INVASION. - SIX LYMPH NODES, NEGATIVE FOR CARCINOMA (0/6). SEE NOTE - SEPARATE TUBULAR ADENOMA WITHOUT HIGH GRADE DYSPLASIA OR MALIGNANCY. Donuts negative. ypT3N0 (0/6)M0.  Repeat CT scan was obtained 02/24/18 - which demonstrated near complete resolution of the presacral fluid collection and near complete resolution of the fluid collection in the right lower quadrant and suprapubic abdominal wall. He continued his course of Cipro/Flagyl which he completed.  CT C/A/P 08/21/18 - no evidence of recurrence or metastatic disease  Completed adjuvant therapy with Xeloda 07/2018. GGE 09/04/18 = demonstrated a small presacral cavity communicating with the distal colorectal anastomosis in the region of the previous drain. The exam was otherwise unremarkable. Flexible sigmoidoscopy by myself 09/13/18 which demonstrated a patent and normal appearing colorectal anastomosis with the exception of some granulation tissue which was heaping up in the posterior midline. This is at the location of his sinus tract seen on his GGE.  INTERVAL HX He underwent colonoscopy with Dr. RLaural Golden1/30/2020 which demonstrated some stool in the cecum. A patent end-to-end  anastomosis was identified with single staple  and scar but no ulceration or granulation tissue. He underwent repeat GGE 01/02/2019 which demonstrated no persistent leakage of contrast in the rectal area. Postradiation changes seen.  He has been doing well. He denies new complaints. He denies any drainage per rectum anymore. He is tolerating a diet. His ileostomy is working well. He has no complaints.  The patient is a 58 year old male.   Allergies Nance Pew, CMA; 01/22/2019 2:05 PM) No Known Drug Allergies [03/02/2018]: Allergies Reconciled   Medication History Nance Pew, CMA; 01/22/2019 2:05 PM) No Current Medications Medications Reconciled    Review of Systems Harrell Gave M. Danniela Mcbrearty MD; 01/22/2019 2:33 PM) General Not Present- Anorexia, Appetite Loss, Chills and Fever. Skin Not Present- Brittle Nails and Bruising. HEENT Not Present- Double Vision and Headache. Neck Not Present- Neck Mass and Neck Pain. Respiratory Not Present- Bloody sputum, Chronic Cough and Decreased Exercise Tolerance. Cardiovascular Not Present- Chest Pain and Difficulty Breathing Lying Down. Gastrointestinal Not Present- Abdominal Pain, Bloating, Bloody Stool, Change in Bowel Habits, Chronic diarrhea, Constipation, Difficulty Swallowing, Excessive gas, Gets full quickly at meals, Hemorrhoids, Indigestion, Nausea, Rectal Pain and Vomiting. Male Genitourinary Not Present- Blood in Urine and Change in Urinary Stream. Musculoskeletal Not Present- Back Pain and Backache. Neurological Not Present- Decreased Memory and Difficulty Speaking. Psychiatric Not Present- Anxiety and Depression. Endocrine Not Present- Appetite Changes and New Diabetes. Hematology Not Present- Easy Bleeding and Easy Bruising.  Vitals (Sabrina Canty CMA; 01/22/2019 2:06 PM) 01/22/2019 2:05 PM Weight: 213.13 lb Height: 69in Body Surface Area: 2.12 m Body Mass Index: 31.47 kg/m  Temp.: 97.85F(Temporal)  Pulse: 93  (Regular)  P.OX: 96% (Room air) BP: 118/80 (Sitting, Left Arm, Standard)       Physical Exam Harrell Gave M. Jodine Muchmore MD; 01/22/2019 2:33 PM) The physical exam findings are as follows: Note:Constitutional: No acute distress; conversant; no deformities Eyes: Moist conjunctiva; no lid lag; anicteric sclerae; pupils equal round and reactive to light Neck: Trachea midline; no palpable thyromegaly Lungs: Normal respiratory effort; no tactile fremitus CV: Regular rate and rhythm; no palpable thrill; no pitting edema GI: Abdomen soft, nontender, nondistended; no palpable hepatosplenomegaly. Stoma pink, protruding nicely. Toothpaste consistency output in appliance MSK: Normal gait; no clubbing/cyanosis Psychiatric: Appropriate affect; alert and oriented 3 Lymphatic: No palpable cervical or axillary lymphadenopathy    Assessment & Plan Harrell Gave M. Dainelle Hun MD; 01/22/2019 2:35 PM) RECTAL CANCER (C20) Story: 71M with low rectal cancer now s/p neoadjuvant chemoXRT with Xeloda and surgery 3/20 - lap ultralow AR with DLI and double stapled coloanal anastomosis - 1.5cm distal margin but this was below the mesorectal margin (bare area) - subsequent anastomotic leak controlled with ileostomy and drains, resolved clinically and they were ultimately removed. Path ypT3N0 (0/6) M0; complete TME with uninvolved CRM. Completed adjuvant Xeloda 07/18/18 CT C/A/P 08/21/18 - no evidence of recurrence/metastatic disease GGE 09/04/18 - small presacral sinus at site of prior leak Flex sig 09/13/18 - granulation tissue posterior midline at anastomosis GGE 01/02/2019 - no evidence of persistent leak/sinus Impression: -On his most recent enema test, his anastomotic sinus/leak appears to have healed. That said, we'll doubly confirm this by repeating his flexible sigmoidoscopy myself. -We discussed the planned procedure, material risks (inlcuding but not limited to bleeding, infection, pneumonia/aspiration) benefits and  alternatives. He expressed understanding and has elected to proceed -If his flexible sigmoidoscopy appears normal, will discuss ileostomy reversal  Signed electronically by Ileana Roup, MD (01/22/2019 2:35 PM)

## 2019-02-20 ENCOUNTER — Telehealth: Payer: Self-pay | Admitting: *Deleted

## 2019-02-20 NOTE — Telephone Encounter (Signed)
Called patient to discuss moving his 4/14 appointment out to late May. He understands and agrees to this. His sigmoidoscopy is on 04/05/19. Scheduling message sent to reschedule him for the week of May 25th.

## 2019-02-22 ENCOUNTER — Telehealth: Payer: Self-pay | Admitting: Oncology

## 2019-02-22 NOTE — Telephone Encounter (Signed)
Scheduled appt per 4/7 sch message - unable to reach patient - left message for patient with appt date and time  And mail letter

## 2019-02-27 ENCOUNTER — Other Ambulatory Visit: Payer: BLUE CROSS/BLUE SHIELD

## 2019-02-27 ENCOUNTER — Ambulatory Visit: Payer: BLUE CROSS/BLUE SHIELD | Admitting: Oncology

## 2019-03-08 ENCOUNTER — Encounter: Payer: Self-pay | Admitting: Nurse Practitioner

## 2019-03-08 ENCOUNTER — Telehealth: Payer: Self-pay

## 2019-03-08 NOTE — Telephone Encounter (Signed)
Returned TC to pt to inquire about what exactly he needed for his medical leave request. Left voicemail for him to call back [place].

## 2019-03-08 NOTE — Telephone Encounter (Signed)
TC from pt stating that he needed a letter for his job to be able to return back to work. Lattie Haw NP printed letter stating that [name]'s is a patient at [facility] and that he may return to work. 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 Patient verbalized understanding. No further problems or concerns at this time. His work letter was faxed to [phone] and fax confirmation was received.

## 2019-04-04 ENCOUNTER — Telehealth: Payer: Self-pay | Admitting: Oncology

## 2019-04-04 NOTE — Telephone Encounter (Signed)
Per [facility]BS reschedule list moved May appointment to July. Left message for patient. Schedule mailed.

## 2019-04-05 ENCOUNTER — Encounter (HOSPITAL_COMMUNITY): Admission: RE | Payer: Self-pay | Source: Home / Self Care

## 2019-04-05 ENCOUNTER — Ambulatory Visit (HOSPITAL_COMMUNITY): Admission: RE | Admit: 2019-04-05 | Payer: BLUE CROSS/BLUE SHIELD | Source: Home / Self Care | Admitting: Surgery

## 2019-04-05 SURGERY — SIGMOIDOSCOPY, FLEXIBLE
Anesthesia: Monitor Anesthesia Care

## 2019-04-13 ENCOUNTER — Ambulatory Visit: Payer: BLUE CROSS/BLUE SHIELD | Admitting: Oncology

## 2019-04-13 ENCOUNTER — Other Ambulatory Visit: Payer: BLUE CROSS/BLUE SHIELD

## 2019-05-16 ENCOUNTER — Inpatient Hospital Stay: Payer: BC Managed Care – PPO | Admitting: Oncology

## 2019-05-16 ENCOUNTER — Telehealth: Payer: Self-pay | Admitting: Oncology

## 2019-05-16 ENCOUNTER — Encounter: Payer: Self-pay | Admitting: *Deleted

## 2019-05-16 ENCOUNTER — Inpatient Hospital Stay: Payer: BC Managed Care – PPO

## 2019-05-16 NOTE — Progress Notes (Signed)
Patient was no show for lab/OV today. Scheduling message sent to reschedule for late July.

## 2019-05-16 NOTE — Telephone Encounter (Signed)
Called pt per 7/1 sch message- no answer - left message for patient to call back to reschedule.

## 2019-05-22 ENCOUNTER — Other Ambulatory Visit: Payer: BC Managed Care – PPO

## 2019-05-22 ENCOUNTER — Telehealth: Payer: Self-pay | Admitting: Nurse Practitioner

## 2019-05-22 ENCOUNTER — Ambulatory Visit: Payer: BC Managed Care – PPO | Admitting: Nurse Practitioner

## 2019-05-22 NOTE — Telephone Encounter (Signed)
Patient was unaware that todays appt had been cancelled. Rescheduled for late July per sch msg that was sent. Gave patient calendar

## 2019-05-25 ENCOUNTER — Other Ambulatory Visit (HOSPITAL_COMMUNITY): Admission: RE | Admit: 2019-05-25 | Payer: BC Managed Care – PPO | Source: Ambulatory Visit

## 2019-05-28 ENCOUNTER — Other Ambulatory Visit (HOSPITAL_COMMUNITY)
Admission: RE | Admit: 2019-05-28 | Discharge: 2019-05-28 | Disposition: A | Discharge status: Home / Self Care | Payer: BC Managed Care – PPO | Source: Ambulatory Visit | Attending: Surgery | Admitting: Surgery

## 2019-05-28 DIAGNOSIS — Z1159 Encounter for screening for other viral diseases: Secondary | ICD-10-CM | POA: Insufficient documentation

## 2019-05-28 LAB — SARS CORONAVIRUS 2 (TAT 6-24 HRS): SARS Coronavirus 2: NEGATIVE

## 2019-05-29 ENCOUNTER — Other Ambulatory Visit: Payer: Self-pay

## 2019-05-29 ENCOUNTER — Encounter (HOSPITAL_COMMUNITY): Payer: Self-pay | Admitting: Emergency Medicine

## 2019-05-29 NOTE — Progress Notes (Signed)
Pre-op endoscopy call completed. Per patient , RN lvmm with pre-op instructions as he is not able to write info down at this time. Patient aware to call RN back with questions about the instructions.

## 2019-05-29 NOTE — Progress Notes (Signed)
Pre-op endoscopy call attempted; no answer . [email or web address]

## 2019-05-30 ENCOUNTER — Ambulatory Visit (HOSPITAL_COMMUNITY): Payer: BC Managed Care – PPO | Admitting: Certified Registered Nurse Anesthetist

## 2019-05-30 ENCOUNTER — Ambulatory Visit (HOSPITAL_COMMUNITY)
Admission: RE | Admit: 2019-05-30 | Discharge: 2019-05-30 | Disposition: A | Discharge status: Home / Self Care | Payer: BC Managed Care – PPO | Attending: Surgery | Admitting: Surgery

## 2019-05-30 ENCOUNTER — Other Ambulatory Visit: Payer: Self-pay

## 2019-05-30 ENCOUNTER — Encounter (HOSPITAL_COMMUNITY)
Admission: RE | Disposition: A | Discharge status: Home / Self Care | Payer: Self-pay | Source: Home / Self Care | Attending: Surgery

## 2019-05-30 DIAGNOSIS — F419 Anxiety disorder, unspecified: Secondary | ICD-10-CM | POA: Insufficient documentation

## 2019-05-30 DIAGNOSIS — Z801 Family history of malignant neoplasm of trachea, bronchus and lung: Secondary | ICD-10-CM | POA: Diagnosis not present

## 2019-05-30 DIAGNOSIS — R002 Palpitations: Secondary | ICD-10-CM | POA: Insufficient documentation

## 2019-05-30 DIAGNOSIS — K5289 Other specified noninfective gastroenteritis and colitis: Secondary | ICD-10-CM | POA: Insufficient documentation

## 2019-05-30 DIAGNOSIS — Z87891 Personal history of nicotine dependence: Secondary | ICD-10-CM | POA: Diagnosis not present

## 2019-05-30 DIAGNOSIS — Z806 Family history of leukemia: Secondary | ICD-10-CM | POA: Insufficient documentation

## 2019-05-30 DIAGNOSIS — R935 Abnormal findings on diagnostic imaging of other abdominal regions, including retroperitoneum: Secondary | ICD-10-CM | POA: Diagnosis present

## 2019-05-30 DIAGNOSIS — K219 Gastro-esophageal reflux disease without esophagitis: Secondary | ICD-10-CM | POA: Diagnosis not present

## 2019-05-30 DIAGNOSIS — Z980 Intestinal bypass and anastomosis status: Secondary | ICD-10-CM | POA: Insufficient documentation

## 2019-05-30 DIAGNOSIS — Z87442 Personal history of urinary calculi: Secondary | ICD-10-CM | POA: Insufficient documentation

## 2019-05-30 DIAGNOSIS — Z809 Family history of malignant neoplasm, unspecified: Secondary | ICD-10-CM | POA: Insufficient documentation

## 2019-05-30 DIAGNOSIS — Z85048 Personal history of other malignant neoplasm of rectum, rectosigmoid junction, and anus: Secondary | ICD-10-CM | POA: Insufficient documentation

## 2019-05-30 HISTORY — DX: Ileostomy status: Z93.2

## 2019-05-30 HISTORY — PX: FLEXIBLE SIGMOIDOSCOPY: SHX5431

## 2019-05-30 SURGERY — SIGMOIDOSCOPY, FLEXIBLE
Anesthesia: Monitor Anesthesia Care

## 2019-05-30 MED ORDER — PROPOFOL 500 MG/50ML IV EMUL
INTRAVENOUS | Status: DC | PRN
  Administered 2019-05-30: 25 ug/kg/min via INTRAVENOUS

## 2019-05-30 MED ORDER — PROPOFOL 10 MG/ML IV BOLUS
INTRAVENOUS | Status: AC
  Filled 2019-05-30: qty 20

## 2019-05-30 MED ORDER — PROPOFOL 10 MG/ML IV BOLUS
INTRAVENOUS | Status: DC | PRN
  Administered 2019-05-30: 80 mg via INTRAVENOUS
  Administered 2019-05-30: 60 mg via INTRAVENOUS
  Administered 2019-05-30: 20 mg via INTRAVENOUS
  Administered 2019-05-30: 40 mg via INTRAVENOUS

## 2019-05-30 MED ORDER — LACTATED RINGERS IV SOLN
INTRAVENOUS | Status: DC | PRN
  Administered 2019-05-30: 13:00:00 via INTRAVENOUS

## 2019-05-30 NOTE — Anesthesia Postprocedure Evaluation (Signed)
Anesthesia Post Note  Patient: VERN GUERETTE  Procedure(s) Performed: FLEXIBLE SIGMOIDOSCOPY (N/A )     Patient location during evaluation: Endoscopy Anesthesia Type: MAC Level of consciousness: awake and alert Pain management: pain level controlled Vital Signs Assessment: post-procedure vital signs reviewed and stable Respiratory status: spontaneous breathing, nonlabored ventilation and respiratory function stable Cardiovascular status: stable and blood pressure returned to baseline Postop Assessment: no apparent nausea or vomiting Anesthetic complications: no    Last Vitals:  Vitals:   05/30/19 1350 05/30/19 1401  BP: 137/90 (!) 141/89  Pulse: 79 69  Resp: 16 15  Temp:    SpO2: 94% 94%    Last Pain:  Vitals:   05/30/19 1343  TempSrc: Temporal  PainSc:                  Kharizma Lesnick,W. EDMOND

## 2019-05-30 NOTE — Anesthesia Preprocedure Evaluation (Addendum)
Anesthesia Evaluation  Patient identified by MRN, date of birth, ID band Patient awake    Reviewed: Allergy & Precautions, H&P , NPO status , Patient's Chart, lab work & pertinent test results  Airway Mallampati: II  TM Distance: >3 FB Neck ROM: Full    Dental no notable dental hx. (+) Teeth Intact, Dental Advisory Given   Pulmonary neg pulmonary ROS, former smoker,    Pulmonary exam normal breath sounds clear to auscultation       Cardiovascular + dysrhythmias  Rhythm:Regular Rate:Normal     Neuro/Psych Anxiety negative neurological ROS     GI/Hepatic Neg liver ROS, GERD  Controlled,  Endo/Other  negative endocrine ROS  Renal/GU negative Renal ROS  negative genitourinary   Musculoskeletal   Abdominal   Peds  Hematology negative hematology ROS (+)   Anesthesia Other Findings   Reproductive/Obstetrics negative OB ROS                            Anesthesia Physical Anesthesia Plan  ASA: II  Anesthesia Plan: MAC   Post-op Pain Management:    Induction: Intravenous  PONV Risk Score and Plan: 1 and Propofol infusion  Airway Management Planned: Simple Face Mask  Additional Equipment:   Intra-op Plan:   Post-operative Plan:   Informed Consent: I have reviewed the patients History and Physical, chart, labs and discussed the procedure including the risks, benefits and alternatives for the proposed anesthesia with the patient or authorized representative who has indicated his/her understanding and acceptance.     Dental advisory given  Plan Discussed with: CRNA  Anesthesia Plan Comments:         Anesthesia Quick Evaluation

## 2019-05-30 NOTE — Anesthesia Procedure Notes (Signed)
Procedure Name: MAC Date/Time: 05/30/2019 1:22 PM Performed by: Deliah Boston, CRNA Pre-anesthesia Checklist: Patient identified, Emergency Drugs available, Suction available and Patient being monitored Patient Re-evaluated:Patient Re-evaluated prior to induction Oxygen Delivery Method: Simple face mask Preoxygenation: Pre-oxygenation with 100% oxygen Induction Type: IV induction Placement Confirmation: positive ETCO2 and breath sounds checked- equal and bilateral

## 2019-05-30 NOTE — H&P (Signed)
CC: Here today for scope  HPI: Craig [name] is an 58 y.o. male  Craig [name] is a very pleasant 58 year old male referred to me by Dr. Laural Golden for evaluation of rectal cancer and we initially met him 09/2017. He underwent his first ever colonoscopy 09/08/17. Findings were remarkable for a partially obstructing tumor in the rectum approximate 5 cm needle verge by endoscopic evaluation. This is biopsied and returned invasive adenocarcinoma. The rest of his endoscopic exam was unremarkable.  He subsequently underwent staging CT chest/abdomen/pelvis which was notable for a bulky rectal mass with perirectal nodal metastases, nonspecific right upper lobe nodule but no other evidence of distant metastatic disease.  MRI Pelvis 10/07/17 - rectal mass with shortest distance from tumor to anal sphincter being 5 cm; tumor extending on the left and extending superiorly along the course of the IMV. One enlarged pathologic appearing 3m perirectal LN. On imaging this was read as a T3N1 lesion.  He subsequently underwent neoadjuvant chemoradiation with Xeloda under the care of Dr. SBenay Spice- which he completed 11/23/17. He tolerated this therapy well and noted substantial improvement in his symptoms throughout his treatment course. He no longer had partial obstructive symptoms, was tolerating a diet without nausea or vomiting and had noted substantially improved amounts of bleeding with bowel movements. The pelvic pain and experienced has also resolved.  He went to the OR 02/01/18 for laparoscopic ultra-low anterior resection, double stapled coloanal anastomosis, takedown of splenic flexure, flex sig and diverting loop ileostomy + Cysto/stents by urology. Postoperatively he developed a leak from his coloanal anastomosis that was initially controlled with the surgical drain and IV antibiotics. He also had some smaller fluid collections near the small bowel the right abdomen that were not amenable to percutaneous drainage and were  managed with IV antibiotics. The collection in the pelvis persisted on follow-up imaging and IR was consult for placement of percutaneous drain which they did through the left gluteal approach. He did well from this. Throughout all of this, he was tolerating a diet and had no symptoms. His ileostomy output was managed on Imodium. He is also  Discharge hospital with a course of oral antibiotics-Cipro/Flagyl.  Path was reviewed with the patient  - ISOLATED FOCI OF RESIDUAL ADENOCARCINOMA INVOLVING RECTOSIGMOID COLON. SEE NOTE.  - TUMOR INVADES INTO PERICOLONIC SOFT TISSUE.  - LARGEST FOCUS OF CARCINOMA MEASURES 0.2 CM.  - NEAR COMPLETE TREATMENT RESPONSE (TUMOR REGRESSION SCORE 1).  - ALL MARGINS ARE NEGATIVE FOR CARCINOMA.  - NEGATIVE FOR LYMPHOVASCULAR OR PERINEURAL INVASION.  - SIX LYMPH NODES, NEGATIVE FOR CARCINOMA (0/6). SEE NOTE  - SEPARATE TUBULAR ADENOMA WITHOUT HIGH GRADE DYSPLASIA OR MALIGNANCY.  Donuts negative.  ypT3N0 (0/6)M0.  Repeat CT scan was obtained 02/24/18 - which demonstrated near complete resolution of the presacral fluid collection and near complete resolution of the fluid collection in the right lower quadrant and suprapubic abdominal wall. He continued his course of Cipro/Flagyl which he completed.   He completed adjuvant therapy with Xeloda without significant issues. He denies any complaints today. He has been gaining weight. He denies nausea/vomiting. His stoma is working well without issues with output or dehydration. He continues to work as a tAdministrator He denies any anorectal complaints or pain.  GGE 08/2018 showed small presacral cavity communicated with colorectal anastomosis.  Flex sig 09/13/18 demonstrated heaped up granulation tissue at this posterior location as well.  Repeat GGE 01/02/2019 showed no persistent leakage of contrast in the rectal area.  He was schedueld  for repeat flex sig following but this was initially delayed due to COVID 19 pandemic.  He is now here for this today  Denies any complaints or changes in his health or health history. Reports his ileostomy has been working well without issues.  Past Medical History:  Diagnosis Date   Anxiety    Truck driver   Dysrhythmia    palpitations   Elevated serum creatinine    1.39 in 06/2012   Family history of cancer    GERD (gastroesophageal reflux disease)    History of kidney stones    Ileostomy in place [facility])    Palpitations    + chest pressure; PVCs   Rectal bleeding 01/07/2017   Rectal cancer ([place]) 09/30/2017    Past Surgical History:  Procedure Laterality Date   COLONOSCOPY N/A 09/08/2017   Procedure: COLONOSCOPY;  Surgeon: Rogene Houston, MD;  [facility];  Service: Endoscopy;  Laterality: N/A;   COLONOSCOPY N/A 12/14/2018   Procedure: COLONOSCOPY;  Surgeon: Rogene Houston, MD;  Location[facility];  Service: Endoscopy;  Laterality: N/A;  [place], URETEROSCOPY AND STENT PLACEMENT Bilateral 02/01/2018   Procedure: CYSTOSCOPY WITH RETROGRADE PYELOGRAM, URETEROSCOPY AND URETERAL CATHETERS BILATERAL;  Surgeon: Alexis Frock, MD;  Location: [facility];  Service: Urology;  Laterality: Bilateral;   FLEXIBLE SIGMOIDOSCOPY N/A 09/13/2018   Procedure: FLEXIBLE SIGMOIDOSCOPY;  Surgeon: Ileana Roup, MD;  Location: [facility];  Service: General;  Laterality: N/A;   LAPAROSCOPIC LOW ANTERIOR RESECTION N/A 02/01/2018   Procedure: LAPAROSCOPIC  LOW ANTERIOR RECTOSIGMOID RESECTION, COLOANAL ANASTOMOSIS;  DIVERTING LOOP ILESTOMY; FLEXIBLE SIGMOIDOSCOPY;  Surgeon: Ileana Roup, MD;  Location: [facility];  Service: General;  Laterality: N/A;    Family History  Problem Relation Age of Onset   Cancer Mother 62       Lung cancer   Leukemia Sister 65   Cancer Maternal Aunt     Social:  reports that he has quit smoking. He has never used smokeless tobacco. He reports current alcohol use. He reports that  he does not use drugs.  Allergies: No Known Allergies  Medications: I have reviewed the patient's current medications.  Results for orders placed or performed during the hospital encounter of 05/28/19 (from the past 48 hour(s))  SARS Coronavirus 2 (Performed in [place] hospital lab)     Status: None   Collection Time: 05/28/19  1:09 PM   Specimen: Nasal Swab  Result Value Ref Range   SARS Coronavirus 2 NEGATIVE NEGATIVE    Comment: (NOTE) SARS-CoV-2 target nucleic acids are NOT DETECTED. The SARS-CoV-2 RNA is generally detectable in upper and lower respiratory specimens during the acute phase of infection. Negative results do not preclude SARS-CoV-2 infection, do not rule out co-infections with other pathogens, and should not be used as the sole basis for treatment or other patient management decisions. Negative results must be combined with clinical observations, patient history, and epidemiological information. The expected result is Negative. Fact Sheet for Patients: SugarRoll.be Fact Sheet for Healthcare Providers: https://www.woods-mathews.com/ This test is not yet approved or cleared by the [place] FDA and  has been authorized for detection and/or diagnosis of SARS-CoV-2 by FDA under an Emergency Use Authorization (EUA). This EUA will remain  in effect (meaning this test can be used) for the duration of the COVID-19 declaration under Section 56 4(b)(1) of the Act, 21 U.S.C. section 360bbb-3(b)(1), unless the authorization is terminated or revoked sooner. Performed at [facility]  [facility], [place] [address] [place], [place] [address]     No results found.  ROS - all of the below systems have been reviewed with the patient and positives are indicated with bold text General: chills, fever or night sweats Eyes: blurry vision or double vision ENT: epistaxis or sore throat Allergy/Immunology: itchy/watery eyes or nasal  congestion Hematologic/Lymphatic: bleeding problems, blood clots or swollen lymph nodes Endocrine: temperature intolerance or unexpected weight changes Breast: new or changing breast lumps or nipple discharge Resp: cough, shortness of breath, or wheezing CV: chest pain or dyspnea on exertion GI: as per HPI GU: dysuria, trouble voiding, or hematuria MSK: joint pain or joint stiffness Neuro: TIA or stroke symptoms Derm: pruritus and skin lesion changes Psych: anxiety and depression  PE Height 5' 9"  (1.753 m), weight 95.3 kg. Constitutional: NAD; conversant; no deformities Eyes: Moist conjunctiva; no lid lag; anicteric; PERRL Neck: Trachea midline; no thyromegaly Lungs: Normal respiratory effort; no tactile fremitus CV: RRR; no palpable thrills; no pitting edema GI: Abd soft, NT/ND; ileostomy in place with appliance covering; no palpable hepatosplenomegaly MSK: Normal gait; no clubbing/cyanosis Psychiatric: Appropriate affect; alert and oriented x3 Lymphatic: No palpable cervical or axillary lymphadenopathy  Results for orders placed or performed during the hospital encounter of 05/28/19 (from the past 48 hour(s))  SARS Coronavirus 2 (Performed in [place] hospital lab)     Status: None   Collection Time: 05/28/19  1:09 PM   Specimen: Nasal Swab  Result Value Ref Range   SARS Coronavirus 2 NEGATIVE NEGATIVE    Comment: (NOTE) SARS-CoV-2 target nucleic acids are NOT DETECTED. The SARS-CoV-2 RNA is generally detectable in upper and lower respiratory specimens during the acute phase of infection. Negative results do not preclude SARS-CoV-2 infection, do not rule out co-infections with other pathogens, and should not be used as the sole basis for treatment or other patient management decisions. Negative results must be combined with clinical observations, patient history, and epidemiological information. The expected result is Negative. Fact Sheet for  Patients: SugarRoll.be Fact Sheet for Healthcare Providers: https://www.woods-mathews.com/ This test is not yet approved or cleared by the [place] FDA and  has been authorized for detection and/or diagnosis of SARS-CoV-2 by FDA under an Emergency Use Authorization (EUA). This EUA will remain  in effect (meaning this test can be used) for the duration of the COVID-19 declaration under Section 56 4(b)(1) of the Act, 21 U.S.C. section 360bbb-3(b)(1), unless the authorization is terminated or revoked sooner. Performed at [facility], [place] [address], [place], [place] [id number]     A/P: Craig [name] is an 30yoM with hx of low rectal cancer now s/p neoadjuvant chemoXRT with Xeloda and surgery 02/01/2018 - lap ultralow AR with DLI and double stapled coloanal anastomosis - 1.5cm distal margin but this was below the mesorectal margin (bare area) - subsequent anastomotic leak controlled with ileostomy and drains, resolved clinically and they were ultimately removed. Path ypT3N0 (0/6) M0; complete TME with uninvolved CRM. Completed adjuvant Xeloda 07/18/18 CT C/A/P 08/21/18 - no evidence of recurrence/metastatic disease GGE 09/04/18 - small presacral sinus at site of prior leak Flex sig 09/13/18 - granulation tissue posterior midline at anastomosis GGE 01/02/2019 - no evidence of persistent leak/sinus  -On his most recent enema test, his anastomotic sinus/leak appears to have healed. That said, we'll doubly confirm this by repeating his flexible sigmoidoscopy today -We discussed the planned procedure, material risks (inlcuding but not limited to bleeding, infection, perforation, pneumonia/aspiration )  benefits and alternatives. He expressed understanding and has elected to proceed -If his flexible sigmoidoscopy appears normal, will discuss ileostomy reversal  Sharon Mt. Dema Severin, M.D. General and Colorectal Surgery [facility]

## 2019-05-30 NOTE — Op Note (Signed)
[facility] Patient Name: Quintell Bonnin Procedure Date: 05/30/2019 MRN: [id number] Attending MD: Ileana Roup MD, MD Date of Birth: [birth year] CSN: [id number] Age: 58 Admit Type: Inpatient Procedure:                Flexible Sigmoidoscopy Indications:              Abnormal barium enema, Preoperative assessment Providers:                Sharon Mt. Tyrique Sporn MD, MD, Cleda Daub, RN,                            Ladona Ridgel, Technician Referring MD:              Medicines:                Monitored Anesthesia Care Complications:            No immediate complications. Estimated Blood Loss:     Estimated blood loss: none. Procedure:                Pre-Anesthesia Assessment:                           - Prior to the procedure, a History and Physical                            was performed, and patient medications, allergies                            and sensitivities were reviewed. The patient's                            tolerance of previous anesthesia was reviewed.                           - The anesthesia plan was to use monitored                            anesthesia care (MAC).                           After obtaining informed consent, the scope was                            passed under direct vision. The GIF-H190 ([id number])                            Olympus gastroscope was introduced through the anus                            and advanced to the the descending colon. The                            flexible sigmoidoscopy was accomplished without                            difficulty. The patient tolerated the procedure  well. The quality of the bowel preparation was                            adequate. Scope In: 1:28:38 PM Scope Out: 1:35:28 PM Total Procedure Duration: 0 hours 6 minutes 50 seconds  Findings:      The perianal and digital rectal examinations were normal. Pertinent       negatives include no palpable masses;  anastomosis patent.      There was evidence of a prior end-to-end colo-colonic anastomosis in the       distal rectum. This was patent and was characterized by healthy       appearing mucosa. The anastomosis was traversed. Estimated blood loss:       none. Impression:               - Patent end-to-end colo-colonic anastomosis,                            characterized by healthy appearing mucosa. No                            evidence of anastomotic sinuses or leak at this                            point. Mild diversion colitis as expected                           - No specimens collected. Moderate Sedation:      Not Applicable - Patient had care per Anesthesia. Recommendation:           - Continue present medications.                           - Return to GI clinic at appointment to be                            scheduled (Dr. Laural Golden) Procedure Code(s):        --- Professional ---                           [phone], Sigmoidoscopy, flexible; diagnostic,                            including collection of specimen(s) by brushing or                            washing, when performed (separate procedure) Diagnosis Code(s):        --- Professional ---                           R93.3, Abnormal findings on diagnostic imaging of                            other parts of digestive tract CPT copyright 2019 American Medical Association. All rights reserved. The codes documented in this report are preliminary and upon coder review may  be revised to meet current compliance requirements. Nadeen Landau,  MD Ileana Roup MD, MD 05/30/2019 1:43:19 PM This report has been signed electronically. Number of Addenda: 0

## 2019-05-30 NOTE — Transfer of Care (Signed)
Immediate Anesthesia Transfer of Care Note  Patient: RYDAN GULYAS  Procedure(s) Performed: Procedure(s): FLEXIBLE SIGMOIDOSCOPY (N/A)  Patient Location: PACU  Anesthesia Type:MAC  Level of Consciousness: Patient easily awoken, sedated, comfortable, cooperative, following commands, responds to stimulation.   Airway & Oxygen Therapy: Patient spontaneously breathing, ventilating well, oxygen via simple oxygen mask.  Post-op Assessment: Report given to PACU RN, vital signs reviewed and stable, moving all extremities.   Post vital signs: Reviewed and stable.  Complications: No apparent anesthesia complications  Last Vitals:  Vitals Value Taken Time  BP 155/98 05/30/19 1343  Temp 37.2 C 05/30/19 1343  Pulse 70 05/30/19 1344  Resp 23 05/30/19 1344  SpO2 99 % 05/30/19 1344  Vitals shown include unvalidated device data.  Last Pain:  Vitals:   05/30/19 1343  TempSrc: Temporal  PainSc:          Complications: No apparent anesthesia complications

## 2019-05-30 NOTE — Discharge Instructions (Signed)
Flexible Sigmoidoscopy, Care After  This sheet gives you information about how to care for yourself after your procedure. Your health care provider may also give you more specific instructions. If you have problems or questions, contact your health care provider.  What can I expect after the procedure?  After the procedure, it is common to have:  · Abdominal cramping or pain.  · Bloating.  · A small amount of rectal bleeding if you had a biopsy.  Follow these instructions at home:  · Take over-the-counter and prescription medicines only as told by your health care provider.  · Do not drive for 24 hours if you received a medicine to help you relax (sedative).  · Keep all follow-up visits as told by your health care provider. This is important.  Contact a health care provider if:  · You have abdominal pain or cramping that gets worse or is not helped with medicine.  · You continue to have small amounts of rectal bleeding after 24 hours.  · You have nausea or vomiting.  · You feel weak or dizzy.  · You have a fever.  Get help right away if:  · You pass large blood clots or see a large amount of blood in the toilet after having a bowel movement.  · You have nausea or vomiting for more than 24 hours after the procedure.  This information is not intended to replace advice given to you by your health care provider. Make sure you discuss any questions you have with your health care provider.  Document Released: 11/06/2013 Document Revised: 06/24/2016 Document Reviewed: 01/31/2016  Elsevier Patient Education © 2020 [employer]

## 2019-05-31 ENCOUNTER — Encounter (HOSPITAL_COMMUNITY): Payer: Self-pay | Admitting: Surgery

## 2019-06-08 ENCOUNTER — Other Ambulatory Visit: Payer: Self-pay | Admitting: *Deleted

## 2019-06-08 DIAGNOSIS — C20 Malignant neoplasm of rectum: Secondary | ICD-10-CM

## 2019-06-11 ENCOUNTER — Inpatient Hospital Stay: Payer: BC Managed Care – PPO | Attending: Nurse Practitioner | Admitting: Nurse Practitioner

## 2019-06-11 ENCOUNTER — Inpatient Hospital Stay: Payer: BC Managed Care – PPO

## 2019-06-11 ENCOUNTER — Telehealth: Payer: Self-pay | Admitting: Nurse Practitioner

## 2019-06-11 NOTE — Telephone Encounter (Signed)
Called pt per 7/27 sch message - left message for patient to call back to reschedule missed appt .

## 2019-09-04 ENCOUNTER — Ambulatory Visit: Payer: Self-pay | Admitting: Surgery

## 2019-09-04 NOTE — H&P (Signed)
Postop f/u  PRIOR HISTORY: Mr. Higinbotham is a very pleasant 58 year old male referred to me by Dr. Laural Golden for evaluation of rectal cancer and we initially met him 09/2017. He underwent his first ever colonoscopy 09/08/17. Findings were remarkable for a partially obstructing tumor in the rectum approximate 5 cm needle verge by endoscopic evaluation. This is biopsied and returned invasive adenocarcinoma. The rest of his endoscopic exam was unremarkable.  He subsequently underwent staging CT chest/abdomen/pelvis which was notable for a bulky rectal mass with perirectal nodal metastases, nonspecific right upper lobe nodule but no other evidence of distant metastatic disease.  MRI Pelvis 10/07/17 - rectal mass with shortest distance from tumor to anal sphincter being 5 cm; tumor extending on the left and extending superiorly along the course of the IMV. One enlarged pathologic appearing 27m perirectal LN. On imaging this was read as a T3N1 lesion.  He subsequently underwent neoadjuvant chemoradiation with Xeloda under the care of Dr. SBenay Spice- which he completed 11/23/17. He tolerated this therapy well and noted substantial improvement in his symptoms throughout his treatment course. He no longer had partial obstructive symptoms, was tolerating a diet without nausea or vomiting and had noted substantially improved amounts of bleeding with bowel movements. The pelvic pain and experienced has also resolved.  He went to the OR 02/01/18 for laparoscopic ultra-low anterior resection, double stapled coloanal anastomosis, takedown of splenic flexure, flex sig and diverting loop ileostomy + Cysto/stents by urology. Postoperatively he developed a leak from his coloanal anastomosis that was initially controlled with the surgical drain and IV antibiotics. He also had some smaller fluid collections near the small bowel the right abdomen that were not amenable to percutaneous drainage and were managed with IV antibiotics.  The collection in the pelvis persisted on follow-up imaging and IR was consult for placement of percutaneous drain which they did through the left gluteal approach. He did well from this. Throughout all of this, he was tolerating a diet and had no symptoms. His ileostomy output was managed on Imodium. He is also Discharge hospital with a course of oral antibiotics-Cipro/Flagyl.   Path was reviewed with the patient - ISOLATED FOCI OF RESIDUAL ADENOCARCINOMA INVOLVING RECTOSIGMOID COLON. SEE NOTE. - TUMOR INVADES INTO PERICOLONIC SOFT TISSUE. - LARGEST FOCUS OF CARCINOMA MEASURES 0.2 CM. - NEAR COMPLETE TREATMENT RESPONSE (TUMOR REGRESSION SCORE 1). - ALL MARGINS ARE NEGATIVE FOR CARCINOMA. - NEGATIVE FOR LYMPHOVASCULAR OR PERINEURAL INVASION. - SIX LYMPH NODES, NEGATIVE FOR CARCINOMA (0/6). SEE NOTE - SEPARATE TUBULAR ADENOMA WITHOUT HIGH GRADE DYSPLASIA OR MALIGNANCY. Donuts negative. ypT3N0 (0/6)M0.  Repeat CT scan was obtained 02/24/18 - which demonstrated near complete resolution of the presacral fluid collection and near complete resolution of the fluid collection in the right lower quadrant and suprapubic abdominal wall. He continued his course of Cipro/Flagyl which he completed.  CT C/A/P 08/21/18 - no evidence of recurrence or metastatic disease  Completed adjuvant therapy with Xeloda 07/2018. GGE 09/04/18 = demonstrated a small presacral cavity communicating with the distal colorectal anastomosis in the region of the previous drain. The exam was otherwise unremarkable. Flexible sigmoidoscopy by myself 09/13/18 which demonstrated a patent and normal appearing colorectal anastomosis with the exception of some granulation tissue which was heaping up in the posterior midline. This is at the location of his sinus tract seen on his GGE.  Colonoscopy with Dr. RLaural Golden1/30/2020 which demonstrated some stool in the cecum. A patent end-to-end anastomosis was identified with single staple  and scar but no ulceration  or granulation tissue. He underwent repeat GGE 01/02/2019 which demonstrated no persistent leakage of contrast in the rectal area. Postradiation changes seen. F/u flex sig by myself 05/2019 showed patent anastomosis without evident sinus or granulation tissue  He has been doing well. He denies new complaints. He denies any drainage per rectum anymore. He is tolerating a diet. His ileostomy is working well.  The patient is a 58 year old male.   Allergies Emeline Gins, [place]; 06/20/2019 3:09 PM) No Known Drug Allergies  [03/02/2018]: Allergies Reconciled   Medication History Emeline Gins, [place]; 06/20/2019 3:09 PM) No Current Medications Medications Reconciled    Review of Systems Harrell Gave M. Manilla Strieter MD; 06/20/2019 3:17 PM) General Not Present- Anorexia, Appetite Loss, Chills and Fever. Skin Not Present- Brittle Nails and Bruising. HEENT Not Present- Double Vision and Headache. Neck Not Present- Neck Mass and Neck Pain. Respiratory Not Present- Bloody sputum, Chronic Cough and Decreased Exercise Tolerance. Cardiovascular Not Present- Chest Pain and Difficulty Breathing Lying Down. Gastrointestinal Not Present- Abdominal Pain, Bloating, Bloody Stool, Change in Bowel Habits, Chronic diarrhea, Constipation, Difficulty Swallowing, Excessive gas, Gets full quickly at meals, Hemorrhoids, Indigestion, Nausea, Rectal Pain and Vomiting. Male Genitourinary Not Present- Blood in Urine and Change in Urinary Stream. Musculoskeletal Not Present- Back Pain and Backache. Neurological Not Present- Decreased Memory and Difficulty Speaking. Psychiatric Not Present- Anxiety and Depression. Endocrine Not Present- Appetite Changes and New Diabetes. Hematology Not Present- Easy Bleeding and Easy Bruising.  Vitals Emeline Gins CMA; 06/20/2019 3:09 PM) 06/20/2019 3:09 PM Weight: 207.2 lb Height: 69in Body Surface Area: 2.1 m Body Mass Index: 30.6 kg/m  Temp.:  98.27F  Pulse: 85 (Regular)  BP: 138/86(Sitting, Left Arm, Standard)       Physical Exam Harrell Gave M. Dena Esperanza MD; 06/20/2019 3:18 PM) The physical exam findings are as follows: Note: Constitutional: No acute distress; conversant; no deformities Eyes: Moist conjunctiva; no lid lag; anicteric sclerae; pupils equal round and reactive to light Neck: Trachea midline; no palpable thyromegaly Lungs: Normal respiratory effort; no tactile fremitus CV: Regular rate and rhythm; no palpable thrill; no pitting edema GI: Abdomen soft, nontender, nondistended; no palpable hepatosplenomegaly. Stoma pink, protruding nicely. Toothpaste consistency output in appliance. MSK: Normal gait; no clubbing/cyanosis Psychiatric: Appropriate affect; alert and oriented 3 Lymphatic: No palpable cervical or axillary lymphadenopathy    Assessment & Plan Harrell Gave M. Jerry Clyne MD; 06/20/2019 3:21 PM)  RECTAL CANCER (C20) Story: 49M with low rectal cancer now s/p neoadjuvant chemoXRT with Xeloda and surgery 3/20 - lap ultralow AR with DLI and double stapled coloanal anastomosis - 1.5cm distal margin but this was below the mesorectal margin (bare area) - subsequent anastomotic leak controlled with ileostomy and drains, resolved clinically and they were ultimately removed. Path ypT3N0 (0/6) M0; complete TME with uninvolved CRM. Completed adjuvant Xeloda 07/18/18 CT C/A/P 08/21/18 - no evidence of recurrence/metastatic disease GGE 09/04/18 - small presacral sinus at site of prior leak Flex sig 09/13/18 - granulation tissue posterior midline at anastomosis GGE 01/02/2019 - no evidence of persistent leak/sinus Flex sig 05/2019 - no granulation tissue or evidence of sinus - appears to have healed -The anatomy and physiology of the GI tract was discussed at length with the patient as pertinent to his prior surgery and ileostomy status - with associated pictures. -We discussed options moving forward and he desires ileostomy  reversal. We discussed takedown of loop ileostomy at length. -The planned procedure, material risks (including, but not limited to, pain, bleeding, infection, scarring, need for blood transfusion, damage  to surrounding structures- blood vessels/nerves/viscus/organs, leak from anastomosis - ileal or colorectal, need for additional procedures, need for stoma which may be permanent, hernia, recurrence, pneumonia, heart attack, stroke, death) benefits and alternatives to surgery were discussed at length. The patient's questions were answered to his satisfaction, he voiced understanding and elected to proceed with surgery. Additionally, we discussed typical postoperative expectations and the recovery process including functional expectations for the first 6-12 months following reversal including LAR syndrome as was also outlined before initial surgery.  Signed electronically by Ileana Roup, MD (06/20/2019 3:21 PM)

## 2019-09-12 ENCOUNTER — Other Ambulatory Visit: Payer: Self-pay | Admitting: Surgery

## 2019-09-12 DIAGNOSIS — C20 Malignant neoplasm of rectum: Secondary | ICD-10-CM

## 2019-09-18 ENCOUNTER — Ambulatory Visit
Admission: RE | Admit: 2019-09-18 | Discharge: 2019-09-18 | Disposition: A | Discharge status: Home / Self Care | Payer: BC Managed Care – PPO | Source: Ambulatory Visit | Attending: Surgery | Admitting: Surgery

## 2019-09-18 DIAGNOSIS — C20 Malignant neoplasm of rectum: Secondary | ICD-10-CM

## 2019-09-18 MED ORDER — IOPAMIDOL (ISOVUE-370) INJECTION 76%
125.0000 mL | Freq: Once | INTRAVENOUS | Status: AC | PRN
  Administered 2019-09-18: 125 mL via INTRAVENOUS

## 2019-09-19 ENCOUNTER — Telehealth: Payer: Self-pay | Admitting: Nurse Practitioner

## 2019-09-19 NOTE — Telephone Encounter (Signed)
Scheduled appt per 11/4 sch message - unable to reach pt . Left message with appt date and time

## 2019-09-24 ENCOUNTER — Inpatient Hospital Stay: Payer: 59

## 2019-09-24 ENCOUNTER — Other Ambulatory Visit: Payer: Self-pay

## 2019-09-24 ENCOUNTER — Inpatient Hospital Stay: Payer: 59 | Attending: Nurse Practitioner | Admitting: Nurse Practitioner

## 2019-09-24 ENCOUNTER — Encounter: Payer: Self-pay | Admitting: Nurse Practitioner

## 2019-09-24 VITALS — BP 108/89 | HR 73 | Temp 98.0°F | Resp 18 | Ht 69.0 in | Wt 211.9 lb

## 2019-09-24 DIAGNOSIS — Z923 Personal history of irradiation: Secondary | ICD-10-CM | POA: Diagnosis not present

## 2019-09-24 DIAGNOSIS — Z9221 Personal history of antineoplastic chemotherapy: Secondary | ICD-10-CM | POA: Insufficient documentation

## 2019-09-24 DIAGNOSIS — R911 Solitary pulmonary nodule: Secondary | ICD-10-CM | POA: Diagnosis not present

## 2019-09-24 DIAGNOSIS — C20 Malignant neoplasm of rectum: Secondary | ICD-10-CM

## 2019-09-24 LAB — CEA (IN HOUSE-CHCC): CEA (CHCC-In House): 7.76 ng/mL — ABNORMAL HIGH (ref 0.00–5.00)

## 2019-09-24 NOTE — Progress Notes (Addendum)
[place] OFFICE PROGRESS NOTE   Diagnosis: Rectal cancer  INTERVAL HISTORY:   Mr. Burdell was last seen at the [facility] 10/30/2018.  He did not return for multiple follow-up visits.  He underwent CTs 09/18/2019 prior to undergoing ileostomy reversal.  A new lung nodule was noted.  He feels well.  He has a good appetite and good energy level.  He denies abdominal pain.  No problems with the ileostomy.  He denies fever, cough, shortness of breath.  Objective:  Vital signs in last 24 hours:  Blood pressure 108/89, pulse 73, temperature 98 F (36.7 C), temperature source Temporal, resp. rate 18, height 5\' 9"  (1.753 m), weight 211 lb 14.4 oz (96.1 kg), SpO2 99 %.    HEENT: Neck without mass.  No thrush or ulcers. Lymphatics: No palpable cervical, supraclavicular, axillary or inguinal lymph nodes. GI: Abdomen soft and nontender.  No hepatomegaly.  Right abdomen ileostomy. Vascular: No leg edema.    Lab Results:  Lab Results  Component Value Date   WBC 2.5 (L) 10/30/2018   HGB 16.2 10/30/2018   HCT 47.0 10/30/2018   MCV 91.6 10/30/2018   PLT 203 10/30/2018   NEUTROABS 1.6 (L) 10/30/2018    Imaging:  No results found.  Medications: I have reviewed the patient's current medications.  Assessment/Plan: 1. Rectal cancer ? Colonoscopy 09/08/2017, rectal mass at 5 cm from the anal verge, biopsy confirmed invasive adenocarcinoma ? Staging CTs 09/15/2017, rectal mass and perirectal lymphadenopathy, indeterminate right upper lobe nodule ? Elevated CEA ? MRI pelvis 10/07/2017-5.3 cm circumferential lower rectal mass with perirectal extension along the inferior mesenteric vein;T3, N1; distance from tumor to the anal sphincter is 5 cm. ? Initiation of radiation and concurrent Xeloda chemotherapy 10/12/2017;completion of radiation/Xeloda 11/23/2017. ? Surgery 02/01/18 per Dr. Nadeen Landau; laparoscopic lower anterior rectosigmoid resection, coloanal  anastomosis, diverting loop ileostomy, flexible sigmoidoscopy; ypT3, pN0 ? Developed low-grade fever and abdominal pain on POD 4, CT AP on 02/06/18 and 02/09/18 shows small loculated fluid collections in RLQ and presacral space, consulted IR for transgluteal presacral abscess drain, treated with IV zosyn then transitioned to po cipro and flagyl ? Follow-up CT 03/24/2018-2.2 x 4.9 x 4.0 cm thick-walled presacral gas and fluid collection/abscess, minimally increased status post removal of surgical drain. No evidence of recurrent or metastatic disease. ? Cycle 1 adjuvant Xeloda 04/12/2018 ? Cycle 2 adjuvant Xeloda 05/03/2018 ? Cycle 3 adjuvant Xeloda 06/05/2018 (Xeloda dose reduced to 1500 mg twice daily secondary to mild neutropenia) ? Cycle 4adjuvant Xeloda 06/26/2018 ? Cycle 5 adjuvant Xeloda 07/18/2018 ? CTs 08/21/2018- no evidence of tumor recurrence or metastatic disease in the chest, abdomen or pelvis.  Presacral soft tissue thickening and some calcifications compatible with postradiation changes.  Stable probably benign 3 mm right upper lobe nodule. ? CTs 09/18/2019-new 1.2 cm pulmonary nodule within the left upper lobe; stable 3 mm nodule right upper lobe.  2.Pain and bleeding secondary to #1.Resolved.  3.Skin breakdown at the gluteal fold secondary to radiation. Resolved.  4.History ofneutropenia secondary to chemotherapy   Disposition: Mr. Traynham appears stable.  He recently underwent CTs prior to scheduling ileostomy reversal.  He is noted to have a new lung nodule.  We are referring him for a PET scan.  He will also return to the lab for a CEA.  He will return for a follow-up visit 10/04/2019.  He will contact the office in the interim with any problems.  Patient seen with Dr. Benay Spice.  CT images  reviewed on the computer with Mr. Lauderback.  15 minutes were spent face-to-face at today's visit with the majority of that time involved in counseling/coordination of care.    Ned Card  ANP/GNP-BC   09/24/2019  12:39 PM  This was a shared visit with Ned Card.  There is a new on the surveillance CT.  We reviewed the CT images and discussed the likelihood of metastatic rectal cancer with Mr. Ruta.  He understands that lesion could represent a primary tumor or a benign finding.  He agrees to a staging PET scan.  We will refer him to thoracic surgery if the lung nodule appears isolated.  Julieanne Manson, MD

## 2019-09-26 ENCOUNTER — Telehealth: Payer: Self-pay | Admitting: Oncology

## 2019-09-26 NOTE — Telephone Encounter (Signed)
Called and left msg

## 2019-10-03 ENCOUNTER — Encounter (HOSPITAL_COMMUNITY)
Admission: RE | Admit: 2019-10-03 | Discharge: 2019-10-03 | Disposition: A | Discharge status: Home / Self Care | Payer: 59 | Source: Ambulatory Visit | Attending: Nurse Practitioner | Admitting: Nurse Practitioner

## 2019-10-03 ENCOUNTER — Other Ambulatory Visit: Payer: Self-pay

## 2019-10-03 DIAGNOSIS — C20 Malignant neoplasm of rectum: Secondary | ICD-10-CM | POA: Diagnosis present

## 2019-10-03 LAB — GLUCOSE, CAPILLARY: Glucose-Capillary: 100 mg/dL — ABNORMAL HIGH (ref 70–99)

## 2019-10-03 MED ORDER — FLUDEOXYGLUCOSE F - 18 (FDG) INJECTION
10.5300 | Freq: Once | INTRAVENOUS | Status: AC | PRN
  Administered 2019-10-03: 10.53 via INTRAVENOUS

## 2019-10-04 ENCOUNTER — Telehealth: Payer: Self-pay | Admitting: *Deleted

## 2019-10-04 ENCOUNTER — Inpatient Hospital Stay: Payer: 59 | Admitting: Nurse Practitioner

## 2019-10-04 NOTE — Telephone Encounter (Signed)
Returned call back to pt in reference to PET scan that was done on yesterday 11/18 and wanting to know result of PET scan. Pt was a no show for office visit on today. Called pt several times but no answer and unable to leave vm. Will continue to contact pt

## 2019-10-05 ENCOUNTER — Other Ambulatory Visit: Payer: Self-pay

## 2019-10-05 ENCOUNTER — Inpatient Hospital Stay (HOSPITAL_BASED_OUTPATIENT_CLINIC_OR_DEPARTMENT_OTHER): Payer: 59 | Admitting: Oncology

## 2019-10-05 ENCOUNTER — Telehealth: Payer: Self-pay | Admitting: *Deleted

## 2019-10-05 VITALS — BP 139/90 | HR 113 | Temp 97.8°F | Resp 18 | Ht 69.0 in | Wt 210.8 lb

## 2019-10-05 DIAGNOSIS — C20 Malignant neoplasm of rectum: Secondary | ICD-10-CM | POA: Diagnosis not present

## 2019-10-05 NOTE — Telephone Encounter (Signed)
Per Ned Card, NP, called and made pt aware of yesterday's missed appt. Also that Provider would like to discuss PET scan by virtual visit or office visit. Pt preferred office visit. Scheduling message sent and pt made aware of today's appt at 2pm.

## 2019-10-05 NOTE — Progress Notes (Signed)
[place] OFFICE PROGRESS NOTE   Diagnosis: Rectal cancer  INTERVAL HISTORY:   Craig [name] reports feeling well.  He has intermittent discomfort in the left abdomen near a drain site.  No other complaint.  He is working.  Objective:  Vital signs in last 24 hours:  Blood pressure 139/90, pulse (!) 113, temperature 97.8 F (36.6 C), temperature source Temporal, resp. rate 18, height 5\' 9"  (1.753 m), weight 210 lb 12.8 oz (95.6 kg), SpO2 94 %. Limited physical examination secondary to distancing with the Covid pandemic Lymphatics: No cervical, supraclavicular, axillary, or inguinal nodes GI: Nontender, no mass, no hepatosplenomegaly, right lower quadrant ileostomy with liquid stool Vascular: No leg edema  Lab Results:  Lab Results  Component Value Date   WBC 2.5 (L) 10/30/2018   HGB 16.2 10/30/2018   HCT 47.0 10/30/2018   MCV 91.6 10/30/2018   PLT 203 10/30/2018   NEUTROABS 1.6 (L) 10/30/2018    CMP  Lab Results  Component Value Date   NA 141 10/30/2018   K 4.3 10/30/2018   CL 109 10/30/2018   CO2 22 10/30/2018   GLUCOSE 121 (H) 10/30/2018   BUN 16 10/30/2018   CREATININE 1.33 (H) 10/30/2018   CALCIUM 9.5 10/30/2018   PROT 7.9 07/31/2018   ALBUMIN 3.9 07/31/2018   AST 19 07/31/2018   ALT 35 07/31/2018   ALKPHOS 146 (H) 07/31/2018   BILITOT 0.7 07/31/2018   GFRNONAA 59 (L) 10/30/2018   GFRAA >60 10/30/2018    Lab Results  Component Value Date   CEA1 7.76 (H) 09/24/2019    Lab Results  Component Value Date   INR 1.05 02/10/2018    Imaging:  Nm Pet Image Initial (pi) Skull Base To Thigh  Result Date: 10/03/2019 CLINICAL DATA:  Subsequent treatment strategy for rectal carcinoma. Post rectal surgery. Diverting ileostomy EXAM: NUCLEAR MEDICINE PET SKULL BASE TO THIGH TECHNIQUE: 10.5 mCi F-18 FDG was injected intravenously. Full-ring PET imaging was performed from the skull base to thigh after the radiotracer. CT data was obtained and used for  attenuation correction and anatomic localization. Fasting blood glucose: 100 mg/dl COMPARISON:  CT 09/18/2019 FINDINGS: Mediastinal blood pool activity: SUV max 2.5 Liver activity: SUV max NA NECK: No hypermetabolic lymph nodes in the neck. Incidental CT findings: none CHEST: Within the LEFT upper lobe 10 mm nodule (image 19) is not changed in size from 10 mm on 09/18/2019. This LEFT upper lobe nodule has intense metabolic activity with SUV max equal 6.0. No additional hypermetabolic pulmonary nodules in LEFT or RIGHT lung. No hypermetabolic mediastinal lymph nodes. Incidental CT findings: none ABDOMEN/PELVIS: No abnormal hypermetabolic activity within the liver, pancreas, adrenal glands, or spleen. No hypermetabolic lymph nodes in the abdomen or pelvis. No abnormal metabolic activity at the low rectal anastomosis. No hypermetabolic abdominopelvic lymph nodes. Incidental CT findings: Loop ileostomy without complication SKELETON: No focal hypermetabolic activity to suggest skeletal metastasis. Incidental CT findings: none IMPRESSION: 1. Hypermetabolic nodule in the LEFT upper lobe consistent with solitary rectal carcinoma metastasis. 2. No evidence of local recurrence at the low rectal anastomosis. 3. No evidence of metastatic disease in the abdomen pelvis. Electronically Signed   By: Suzy Bouchard M.D.   On: 10/03/2019 16:07    Medications: I have reviewed the patient's current medications.   Assessment/Plan: 1. Rectal cancer ? Colonoscopy 09/08/2017, rectal mass at 5 cm from the anal verge, biopsy confirmed invasive adenocarcinoma ? Staging CTs 09/15/2017, rectal mass and perirectal lymphadenopathy, indeterminate right upper  lobe nodule ? Elevated CEA ? MRI pelvis 10/07/2017-5.3 cm circumferential lower rectal mass with perirectal extension along the inferior mesenteric vein;T3, N1; distance from tumor to the anal sphincter is 5 cm. ? Initiation of radiation and concurrent Xeloda chemotherapy  10/12/2017;completion of radiation/Xeloda 11/23/2017. ? Surgery 02/01/18 per Dr. Nadeen Landau; laparoscopic lower anterior rectosigmoid resection, coloanal anastomosis, diverting loop ileostomy, flexible sigmoidoscopy; ypT3, pN0 ? Developed low-grade fever and abdominal pain on POD 4, CT AP on 02/06/18 and 02/09/18 shows small loculated fluid collections in RLQ and presacral space, consulted IR for transgluteal presacral abscess drain, treated with IV zosyn then transitioned to po cipro and flagyl ? Follow-up CT 03/24/2018-2.2 x 4.9 x 4.0 cm thick-walled presacral gas and fluid collection/abscess, minimally increased status post removal of surgical drain. No evidence of recurrent or metastatic disease. ? Cycle 1 adjuvant Xeloda 04/12/2018 ? Cycle 2 adjuvant Xeloda 05/03/2018 ? Cycle 3 adjuvant Xeloda 06/05/2018 (Xeloda dose reduced to 1500 mg twice daily secondary to mild neutropenia) ? Cycle 4adjuvant Xeloda 06/26/2018 ? Cycle 5 adjuvant Xeloda 07/18/2018 ? CTs 08/21/2018- no evidence of tumor recurrence or metastatic disease in the chest, abdomen or pelvis.  Presacral soft tissue thickening and some calcifications compatible with postradiation changes.  Stable probably benign 3 mm right upper lobe nodule. ? CTs 09/18/2019-new 1.2 cm pulmonary nodule within the left upper lobe; stable 3 mm nodule right upper lobe. ? PET scan 10/03/2019-hypermetabolic left upper lobe nodule, no evidence of local recurrence, no other evidence of metastatic disease  2.Pain and bleeding secondary to #1.Resolved.  3.Skin breakdown at the gluteal fold secondary to radiation. Resolved.  4.History ofneutropenia secondary to chemotherapy     Disposition: Craig [name] appears well.  The CEA is mildly elevated.  The CT and PET scan are consistent with an isolated left lung metastasis.  I reviewed the PET images and discussed treatment options with Craig [name].  He understands resection of the lung lesion could  potentially be curative.  We will refer him to Dr. Servando Snare to consider surgery.  I do not recommend systemic therapy at present.  He is scheduled for reversal of the ileostomy on 10/25/2019.  He would like to have the ileostomy reversed as scheduled.  He could have the lung surgery at a later date.  I will present his case at the GI tumor conference.  Craig [name] will return for an office visit during the week of 11/12/2019.  Betsy Coder, MD  10/05/2019  2:45 PM

## 2019-10-08 ENCOUNTER — Telehealth: Payer: Self-pay | Admitting: Oncology

## 2019-10-08 ENCOUNTER — Encounter: Payer: 59 | Admitting: Cardiothoracic Surgery

## 2019-10-08 NOTE — Telephone Encounter (Signed)
Scheduled per los. Called and left msg. Mailed printout  °

## 2019-10-15 ENCOUNTER — Other Ambulatory Visit: Payer: Self-pay

## 2019-10-15 ENCOUNTER — Institutional Professional Consult (permissible substitution): Payer: 59 | Admitting: Cardiothoracic Surgery

## 2019-10-15 ENCOUNTER — Encounter: Payer: Self-pay | Admitting: Cardiothoracic Surgery

## 2019-10-15 VITALS — BP 134/91 | HR 83 | Temp 97.9°F | Resp 16 | Ht 69.0 in | Wt 210.0 lb

## 2019-10-15 DIAGNOSIS — Z85048 Personal history of other malignant neoplasm of rectum, rectosigmoid junction, and anus: Secondary | ICD-10-CM | POA: Diagnosis not present

## 2019-10-15 DIAGNOSIS — D381 Neoplasm of uncertain behavior of trachea, bronchus and lung: Secondary | ICD-10-CM | POA: Diagnosis not present

## 2019-10-15 NOTE — Progress Notes (Signed)
[address]       [place] [address]             [phone]                    Maritza D Pennella [facility] Medical Record #[id number] Date of Birth: 1961-10-22  Referring: Ladell Pier, MD Primary Care: Patient, No Pcp Per Primary Cardiologist: No primary care provider on file.  Chief Complaint:    Chief Complaint  Patient presents with  . Lung Lesion    new LULobe lung lesion per CT C/A/P 09/18/19, PET 10/03/19...h/o RECTAL CANCER/reversal of colostomy   Cancer Staging No matching staging information was found for the patient.  History of Present Illness:    LADISLAUS REPSHER 58 y.o. male is seen in the office  today for for a new left upper lobe lung nodule 1.2 cm in size hypermetabolic.  Mass was not present on a CT scan of the chest 1 year ago.  The patient has been treated for known rectal cancer first diagnosed in November 2018.  Subsequent follow-up CT scans showed a new lung lesion and the patient was referred for consideration of resection, of which could be a solitary lung metastasis versus primary lung cancer.  The patient is a very distant smoker but quit at the age of 56.  He currently works full-time as a Administrator.  He denies any shortness of breath wheezing or other respiratory difficulty.  He has had no previous cardiac history.    From Cancer history: 1. Rectal cancer ? Colonoscopy 09/08/2017, rectal mass at 5 cm from the anal verge, biopsy confirmed invasive adenocarcinoma ? Staging CTs 09/15/2017, rectal mass and perirectal lymphadenopathy, indeterminate right upper lobe nodule ? Elevated CEA ? MRI pelvis 10/07/2017-5.3 cm circumferential lower rectal mass with perirectal extension along the inferior mesenteric vein;T3, N1; distance from tumor to the anal sphincter is 5 cm. ? Initiation of radiation and concurrent Xeloda chemotherapy 10/12/2017;completion of radiation/Xeloda 11/23/2017. ? Surgery 02/01/18 per Dr. Nadeen Landau;  laparoscopic lower anterior rectosigmoid resection, coloanal anastomosis, diverting loop ileostomy, flexible sigmoidoscopy; ypT3, pN0 ? Developed low-grade fever and abdominal pain on POD 4, CT [facility] on 02/06/18 and 02/09/18 shows small loculated fluid collections in RLQ and presacral space, consulted IR for transgluteal presacral abscess drain, treated with IV zosyn then transitioned to po cipro and flagyl ? Follow-up CT 03/24/2018-2.2 x 4.9 x 4.0 cm thick-walled presacral gas and fluid collection/abscess, minimally increased status post removal of surgical drain. No evidence of recurrent or metastatic disease. ? Cycle 1 adjuvant Xeloda 04/12/2018 ? Cycle 2 adjuvant Xeloda 05/03/2018 ? Cycle 3 adjuvant Xeloda 06/05/2018 (Xeloda dose reduced to 1500 mg twice daily secondary to mild neutropenia) ? Cycle 4adjuvant Xeloda 06/26/2018 ? Cycle 5 adjuvant Xeloda 07/18/2018 ? CTs 08/21/2018-no evidence of tumor recurrence or metastatic disease in the chest, abdomen or pelvis. Presacral soft tissue thickening and some calcifications compatible with postradiation changes. Stable probablybenign 3 mm right upper lobe nodule. ? CTs 09/18/2019-new 1.2 cm pulmonary nodule within the left upper lobe; stable 3 mm nodule right upper lobe. ? PET scan 10/03/2019-hypermetabolic left upper lobe nodule, no evidence of local recurrence, no other evidence of metastatic disease  Current Activity/ Functional Status:  Patient is independent with mobility/ambulation, transfers, ADL's, IADL's.   Zubrod Score: At the time of surgery this patient's most appropriate activity status/level should be described as: [x]     0  Normal activity, no symptoms []     1    Restricted in physical strenuous activity but ambulatory, able to do out light work []     2    Ambulatory and capable of self care, unable to do work activities, up and about               >50 % of waking hours                              []     3    Only limited self care,  in bed greater than 50% of waking hours []     4    Completely disabled, no self care, confined to bed or chair []     5    Moribund   Past Medical History:  Diagnosis Date  . Anxiety    Truck driver  . Dysrhythmia    palpitations  . Elevated serum creatinine    1.39 in 06/2012  . Family history of cancer   . GERD (gastroesophageal reflux disease)   . History of kidney stones   . Ileostomy in place [facility])   . Palpitations    + chest pressure; PVCs  . Rectal bleeding 01/07/2017  . Rectal cancer ([place]) 09/30/2017    Past Surgical History:  Procedure Laterality Date  . COLONOSCOPY N/A 09/08/2017   Procedure: COLONOSCOPY;  Surgeon: Rogene Houston, MD;  [facility];  Service: Endoscopy;  Laterality: N/A;  . COLONOSCOPY N/A 12/14/2018   Procedure: COLONOSCOPY;  Surgeon: Rogene Houston, MD;  Location: [facility];  Service: Endoscopy;  Laterality: N/A;  830  . CYSTOSCOPY WITH RETROGRADE PYELOGRAM, URETEROSCOPY AND STENT PLACEMENT Bilateral 02/01/2018   Procedure: CYSTOSCOPY WITH RETROGRADE PYELOGRAM, URETEROSCOPY AND URETERAL CATHETERS BILATERAL;  Surgeon: Alexis Frock, MD;  Location: [facility];  Service: Urology;  Laterality: Bilateral;  . FLEXIBLE SIGMOIDOSCOPY N/A 09/13/2018   Procedure: FLEXIBLE SIGMOIDOSCOPY;  Surgeon: Ileana Roup, MD;  Location: [facility];  Service: General;  Laterality: N/A;  . FLEXIBLE SIGMOIDOSCOPY N/A 05/30/2019   Procedure: Beryle Quant;  Surgeon: Ileana Roup, MD;  Location: [facility];  Service: General;  Laterality: N/A;  . LAPAROSCOPIC LOW ANTERIOR RESECTION N/A 02/01/2018   Procedure: LAPAROSCOPIC  LOW ANTERIOR RECTOSIGMOID RESECTION, COLOANAL ANASTOMOSIS;  DIVERTING LOOP ILESTOMY; FLEXIBLE SIGMOIDOSCOPY;  Surgeon: Ileana Roup, MD;  Location: [facility];  Service: General;  Laterality: N/A;    Family History  Problem Relation Age of Onset  . Cancer Mother 34       Lung cancer  . Leukemia Sister 43  .  Cancer Maternal Aunt      Social History   Tobacco Use  Smoking Status Former Smoker  Smokeless Tobacco Never Used  Tobacco Comment   quit at age 22    Social History   Substance and Sexual Activity  Alcohol Use Yes   Comment: occ- none since 08/2017     No Known Allergies  Current Outpatient Medications  Medication Sig Dispense Refill  . alum & mag hydroxide-simeth (MAALOX/MYLANTA) 200-200-20 MG/5ML suspension Take 30 mLs by mouth every 6 (six) hours as needed for indigestion or heartburn.    . diphenhydrAMINE (SOMINEX) 25 MG tablet Take 50 mg by mouth at bedtime as needed for sleep.     . naphazoline-glycerin (CLEAR EYES REDNESS) 0.012-0.2 % SOLN Place 1-2 drops into both eyes 4 (four) times daily as needed for eye irritation.  No current facility-administered medications for this visit.       Review of Systems:     Cardiac Review of Systems: [Y] = yes  or   [ N ] = no   Chest Pain [ n   ]  Resting SOB [ n  ] Exertional SOB  [  n]  Orthopnea [ n ]   Pedal Edema [ n  ]    Palpitations [ n ] Syncope  [ n ]   Presyncope [ n  ]   General Review of Systems: [Y] = yes [  ]=no Constitional: recent weight change [  ];  Wt loss over the last 3 months [   ] anorexia [  ]; fatigue [  ]; nausea [  ]; night sweats [  ]; fever [  ]; or chills [  ];           Eye : blurred vision [  ]; diplopia [   ]; vision changes [  ];  Amaurosis fugax[  ]; Resp: cough [  ];  wheezing[  ];  hemoptysis[  ]; shortness of breath[  ]; paroxysmal nocturnal dyspnea[  ]; dyspnea on exertion[  ]; or orthopnea[  ];  GI:  gallstones[  ], vomiting[  ];  dysphagia[  ]; melena[  ];  hematochezia [  ]; heartburn[  ];   Hx of  Colonoscopy[y  ]; GU: kidney stones [  ]; hematuria[  ];   dysuria [  ];  nocturia[  ];  history of     obstruction [  ]; urinary frequency [  ]             Skin: rash, swelling[  ];, hair loss[  ];  peripheral edema[  ];  or itching[  ]; Musculosketetal: myalgias[  ];  joint swelling[   ];  joint erythema[  ];  joint pain[  ];  back pain[  ];  Heme/Lymph: bruising[  ];  bleeding[  ];  anemia[  ];  Neuro: TIA[  ];  headaches[  ];  stroke[  ];  vertigo[  ];  seizures[  ];   paresthesias[  ];  difficulty walking[  ];  Psych:depression[  ]; anxiety[  ];  Endocrine: diabetes[  ];  thyroid dysfunction[  ];  Immunizations: Flu up to date [  ]; Pneumococcal up to date [  ];  Other:     PHYSICAL EXAMINATION: BP (!) 134/91 (BP Location: Right Arm, Patient Position: Sitting, Cuff Size: Large)   Pulse 83   Temp 97.9 F (36.6 C)   Resp 16   Ht 5\' 9"  (1.753 m)   Wt 95.3 kg   SpO2 94% Comment: RA  BMI 31.01 kg/m  General appearance: alert, cooperative and no distress Head: Normocephalic, without obvious abnormality, atraumatic Neck: no adenopathy, no carotid bruit, no JVD, supple, symmetrical, trachea midline and thyroid not enlarged, symmetric, no tenderness/mass/nodules Lymph nodes: Cervical, supraclavicular, and axillary nodes normal. Resp: clear to auscultation bilaterally Cardio: regular rate and rhythm, S1, S2 normal, no murmur, click, rub or gallop GI: soft, non-tender; bowel sounds normal; no masses,  no organomegaly Extremities: extremities normal, atraumatic, no cyanosis or edema and Homans sign is negative, no sign of DVT Neurologic: Grossly normal  Diagnostic Studies & Laboratory data:     Recent Radiology Findings:   Ct Chest W Contrast  Result Date: 09/18/2019 CLINICAL DATA:  Preop for ileostomy take-down. History of rectal cancer resected on 02/01/2018. EXAM: CT CHEST,  ABDOMEN, AND PELVIS WITH CONTRAST TECHNIQUE: Multidetector CT imaging of the chest, abdomen and pelvis was performed following the standard protocol during bolus administration of intravenous contrast. CONTRAST:  178mL ISOVUE-370 IOPAMIDOL (ISOVUE-370) INJECTION 76% COMPARISON:  CT chest abdomen pelvis dated 08/21/2018. FINDINGS: CT CHEST FINDINGS Cardiovascular: Heart size is normal. No  pericardial effusion. No thoracic aortic aneurysm or evidence of aortic dissection. Mediastinum/Nodes: No mass or enlarged lymph nodes seen within the mediastinum, perihilar or axillary regions. Esophagus appears normal. Trachea and central bronchi are unremarkable. Lungs/Pleura: New 1.2 cm nodule within the LEFT upper lobe (series 4, image 57). Stable 3 mm nodule within the RIGHT upper lobe (image 63). Lungs are otherwise clear. Musculoskeletal: No acute or suspicious osseous finding. CT ABDOMEN PELVIS FINDINGS Hepatobiliary: No focal mass or lesion is seen within the liver. Subtle low-density area within the RIGHT liver lobe is likely artifactual or related to geographic fatty infiltration. Gallbladder appears normal. No bile duct dilatation. Pancreas: Unremarkable. No pancreatic ductal dilatation or surrounding inflammatory changes. Spleen: Normal in size without focal abnormality. Adrenals/Urinary Tract: Adrenal glands appear normal. Kidneys are unremarkable without mass, stone or hydronephrosis. Bladder is decompressed. Stomach/Bowel: Surgical changes at the rectum, compatible with the given history of rectal resection. Associated chronic presacral thickening. No obstruction, inflammation or other complicating features at the surgical anastomosis. No dilated large or small bowel loops. RIGHT lower quadrant ostomy in place. Appendix is normal. Stomach is unremarkable, partially decompressed. Vascular/Lymphatic: No vascular abnormality. No enlarged lymph nodes seen in the abdomen or pelvis. Reproductive: Prostate is unremarkable. Other: No free fluid or abscess collection seen. No free intraperitoneal air. Musculoskeletal: No acute or suspicious osseous finding. IMPRESSION: 1. New 1.2 cm pulmonary nodule within the LEFT upper lobe (series 4, image 57), new compared to the chest CT of 08/21/2018, highly suspicious for neoplastic process (favor metastatic disease). Recommend further characterization with PET-CT  and/or tissue sampling. 2. Surgical anastomosis at the rectum. No obstruction, inflammation or other complicating feature at the surgical anastomosis. 3. RIGHT lower quadrant ostomy in place. No bowel obstruction or inflammation. 4. Remainder of the chest, abdomen and pelvis CT is unremarkable, as detailed above. No bowel obstruction. No evidence of additional metastasis within the abdomen or pelvis. These results will be called to the ordering clinician or representative by the Radiologist Assistant, and communication documented in the PACS or zVision Dashboard. Electronically Signed   By: Franki Cabot M.D.   On: 09/18/2019 14:25    Nm Pet Image Initial (pi) Skull Base To Thigh  Result Date: 10/03/2019 CLINICAL DATA:  Subsequent treatment strategy for rectal carcinoma. Post rectal surgery. Diverting ileostomy EXAM: NUCLEAR MEDICINE PET SKULL BASE TO THIGH TECHNIQUE: 10.5 mCi F-18 FDG was injected intravenously. Full-ring PET imaging was performed from the skull base to thigh after the radiotracer. CT data was obtained and used for attenuation correction and anatomic localization. Fasting blood glucose: 100 mg/dl COMPARISON:  CT 09/18/2019 FINDINGS: Mediastinal blood pool activity: SUV max 2.5 Liver activity: SUV max NA NECK: No hypermetabolic lymph nodes in the neck. Incidental CT findings: none CHEST: Within the LEFT upper lobe 10 mm nodule (image 19) is not changed in size from 10 mm on 09/18/2019. This LEFT upper lobe nodule has intense metabolic activity with SUV max equal 6.0. No additional hypermetabolic pulmonary nodules in LEFT or RIGHT lung. No hypermetabolic mediastinal lymph nodes. Incidental CT findings: none ABDOMEN/PELVIS: No abnormal hypermetabolic activity within the liver, pancreas, adrenal glands, or spleen. No hypermetabolic lymph nodes in the abdomen  or pelvis. No abnormal metabolic activity at the low rectal anastomosis. No hypermetabolic abdominopelvic lymph nodes. Incidental CT  findings: Loop ileostomy without complication SKELETON: No focal hypermetabolic activity to suggest skeletal metastasis. Incidental CT findings: none IMPRESSION: 1. Hypermetabolic nodule in the LEFT upper lobe consistent with solitary rectal carcinoma metastasis. 2. No evidence of local recurrence at the low rectal anastomosis. 3. No evidence of metastatic disease in the abdomen pelvis. Electronically Signed   By: Suzy Bouchard M.D.   On: 10/03/2019 16:07     I have independently reviewed the above radiology studies  and reviewed the findings with the patient.   Recent Lab Findings: Lab Results  Component Value Date   WBC 2.5 (L) 10/30/2018   HGB 16.2 10/30/2018   HCT 47.0 10/30/2018   PLT 203 10/30/2018   GLUCOSE 121 (H) 10/30/2018   ALT 35 07/31/2018   AST 19 07/31/2018   NA 141 10/30/2018   K 4.3 10/30/2018   CL 109 10/30/2018   CREATININE 1.33 (H) 10/30/2018   BUN 16 10/30/2018   CO2 22 10/30/2018   TSH 1.442 07/25/2012   INR 1.05 02/10/2018   HGBA1C 5.4 01/30/2018   PFT's pending     Assessment / Plan:   #1 rectal cancer previously treated with surgical resection and chemotherapy.  Now patient appears to have a solitary nodule left upper lobe that is highly suspicious for metastatic rectal cancer.  I reviewed the scans with the patient and have recommended to him that we proceed with surgical resection of the left upper lobe lung nodule, as this appears to be the only site of disease if this is a lung metastasis, there is an outside chance it could be a primary lung cancer early stage.  Risks of surgery were discussed with the patient including bleeding, respiratory failure, cardiac valves, pulmonary emboli, prolonged air leak involvement discussed.  We also discussed the pros and cons of attempted biopsy now but the suspicious nature of the lesion would lead to resection even if he had a negative biopsy.  We will tentatively plan to proceed on December 7 with bronchoscopy  left video-assisted thoracoscopy lung resection.   Grace Isaac MD      [address] [place] [address] Office [phone]     10/15/2019 5:38 PM

## 2019-10-15 NOTE — Patient Instructions (Signed)
Pulmonary Nodule A pulmonary nodule is a small, round growth of tissue in the lung. It is sometimes referred to as a "shadow" or "spot on the lung." Nodules range in size from less than 1/5 of an inch (4 mm) to a little bigger than an inch (30 mm). Pulmonary nodules can be either noncancerous (benign) or cancerous (malignant). Most are noncancerous. Smaller nodules in people who do not smoke and do not have any other risk factors for lung cancer are more likely to be noncancerous. Larger, irregular nodules in people who smoke or who have a strong family history of lung cancer are more likely to be cancerous. What are the causes? This condition may be caused by:  A bacterial, fungal, or viral infection, such as tuberculosis. The infection is usually an old and inactive one.  A noncancerous mass of tissue.  Inflammation from conditions such as rheumatoid arthritis.  Abnormal blood vessels in the lungs.  Cancerous tissue, such as lung cancer or a cancer in another part of the body that has spread to the lung. What are the signs or symptoms? This condition usually does not cause symptoms. If symptoms appear, they are usually related to the underlying cause. For example, if the condition is caused by an infection, you may have a cough or fever. How is this diagnosed? This condition is usually diagnosed with an X-ray or CT scan. To help determine whether a pulmonary nodule is benign or malignant, your health care provider will:  Take your medical history.  Perform a physical exam.  Order tests, including: ? Blood tests. ? A skin test called a tuberculin test. This test is done to check if you have been exposed to the germ that causes tuberculosis. ? Chest X-rays. ? A CT scan. This test shows smaller pulmonary nodules more clearly and with more detail than an X-ray. ? A positron emission tomography (PET) scan. This test is done to check if the nodule is cancerous. During the test, a safe amount  of a radioactive substance is injected into the bloodstream. Then a picture is taken. ? Biopsy. In this test, a tiny piece of the pulmonary nodule is removed and then examined under a microscope. How is this treated? Treatment for this condition depends on whether the pulmonary nodule is malignant or benign as well as your risk of getting cancer.  Noncancerous nodules usually do not need to be treated, but they may need to be monitored with CT scans. If a CT scan shows that the pulmonary nodule got bigger, more tests may be done.  Some nodules need to be removed. If this is the case, you may have a procedure called a thoractomy. During the procedure, your health care provider will make an incision in your chest and remove the part of the lung where the nodule is located. Follow these instructions at home:   Take over-the-counter and prescription medicines only as told by your health care provider.  Do not use any products that contain nicotine or tobacco, such as cigarettes and e-cigarettes. If you need help quitting, ask your health care provider.  Keep all follow-up visits as told by your health care provider. This is important. Contact a health care provider if:  You have trouble breathing when you are active.  You feel sick or unusually tired.  You do not feel like eating.  You lose weight without trying.  You develop chills or night sweats. Get help right away if:  You cannot catch your breath.  You begin wheezing.  You cannot stop coughing.  You cough up blood.  You become dizzy or feel like you are going to faint.  You have sudden chest pain.  You have a fever or persistent symptoms for more than 2-3 days.  You have a fever and your symptoms suddenly get worse. Summary  A pulmonary nodule is a small, round growth of tissue in the lung. Most pulmonary nodules are noncancerous.  This condition is usually diagnosed with an X-ray or CT scan.  Common causes of  pulmonary nodules include infection, inflammation, and noncancerous growths.  Though less common, if a nodule is found to be cancerous, you will need specific diagnostic tests and treatment options as directed by your medical provider.  Treatment for this condition depends on whether the pulmonary nodule is benign or malignant as well as your risk of getting cancer. This information is not intended to replace advice given to you by your health care provider. Make sure you discuss any questions you have with your health care provider. Document Released: 08/29/2009 Document Revised: 11/25/2017 Document Reviewed: 11/30/2016 Elsevier Patient Education  2020 [place] [facility] Video-assisted thoracic surgery (VATS) is a procedure that allows your surgeon to look inside your chest and perform minor procedures as needed. The thoracic area is between the neck and abdomen. VATS is commonly done to:  Study or diagnose problems in the chest.  Remove a tissue sample (biopsy) to be examined.  Put medicines directly into the chest.  Remove collections of fluid, pus, or blood.  Remove tumors.  Remove a part of a lung (lobectomy).  Treat some problems with the spine, including: ? Abnormal curves (scoliosis or kyphosis). ? Breaks (fractures). ? Tumors. VATS is done using thoracoscopy. Thoracoscopy is a procedure in which a thin tube with a light and camera on the end (thoracoscope) is inserted through a small incision in the chest wall. The thoracoscope sends images to a video monitor that your surgeon will use to view the inside of your chest. Tell a health care provider about:  Any allergies you have.  All medicines you are taking, including vitamins, herbs, eye drops, creams, and over-the-counter medicines.  Any problems you or family members have had with anesthetic medicines.  Any surgeries you have had.  Any medical conditions you have.  Any blood  disorders you have.  Whether you are pregnant or may be pregnant. What are the risks? Generally, this is a safe procedure. However, problems may occur, including:  Infection.  Severe bleeding (hemorrhage).  Allergic reaction to medicines.  Damage to structures or organs in the chest, such as nerves.  Lung infection (pneumonia).  Inability to complete the procedure. If this is the case, your chest may need to be opened with a large incision (thoracotomy). What happens before the procedure? Medicines  Ask your health care provider about: ? Changing or stopping your regular medicines. This is especially important if you are taking diabetes medicines or blood thinners. ? Taking medicines such as aspirin and ibuprofen. These medicines can thin your blood. Do not take these medicines before your procedure if your health care provider instructs you not to.  You may be given antibiotic medicine to help prevent infection. Staying hydrated Follow instructions from your health care provider about hydration, which may include:  Up to 2 hours before the procedure - you may continue to drink clear liquids, such as water, clear fruit juice, black coffee, and plain tea. Eating and  drinking restrictions Follow instructions from your health care provider about eating and drinking, which may include:  8 hours before the procedure - stop eating heavy meals or foods such as meat, fried foods, or fatty foods.  6 hours before the procedure - stop eating light meals or foods, such as toast or cereal.  6 hours before the procedure - stop drinking milk or drinks that contain milk.  2 hours before the procedure - stop drinking clear liquids. General instructions  Ask your health care provider how your surgical site will be marked or identified.  You may be asked to shower with a germ-killing soap.  You may have a blood or urine sample taken.  You may have imaging tests, such as: ? Chest  X-ray. ? Electrocardiogram (ECG). ? Ultrasound. ? CT scan.  Do not use any products that contain nicotine or tobacco for as long as possible before your procedure. These include cigarettes and e-cigarettes. If you need help quitting, ask your health care provider.  Plan to have someone take you home from the hospital or clinic.  If you will be going home right after the procedure, plan to have someone with you for 24 hours. What happens during the procedure?  To lower your risk of infection: ? Your health care team will wash or sanitize their hands. ? Your skin will be washed with soap.  An IV tube will be inserted into one of your veins.  You will be given one or more of the following: ? A medicine to make you fall asleep (general anesthetic). ? A medicine to help you relax (sedative). ? A medicine that is injected into an area of your body to numb everything below the injection site (regional anesthetic). This is less common for this procedure. It may be given if you are not able to tolerate general anesthetic.  A thin tube (catheter) will be inserted into your bladder and your tube that carries urine out of your body (urethra). The catheter will drain your urine.  1-4 small incisions will be made in your chest. The number of incisions depends on the purpose of your procedure.  The thoracoscope will be inserted into your incision(s) and used to view the inside of your chest.  One of your lungs will be deflated. This makes it easier for your surgeon to see the area.  Other surgical instruments may be inserted through your incision(s) to perform necessary procedures.  After any procedures are done, your lung will be inflated.  A chest tube may be inserted through an incision to drain excess fluid from the surgical area.  Any remaining incisions will be closed with stitches (sutures) or staples.  A bandage (dressing) may be placed over your incision(s). The procedure may vary  among health care providers and hospitals. What happens after the procedure?  Your blood pressure, heart rate, breathing rate, and blood oxygen level will be monitored until the medicines you were given have worn off.  You may have a chest tube draining fluid from the surgical area. The chest tube will be closely monitored for signs of fluid or air buildup in your lungs.  You may continue to receive fluids and medicines through an IV tube.  You may have to wear compression stockings. These stockings help to prevent blood clots and reduce swelling in your legs.  Do not drive for 24 hours if you were given a sedative. Summary  Video-assisted thoracic surgery (VATS) is a procedure that allows your surgeon  to look inside your chest to study or diagnose problems in your thoracic area.  VATS is done by inserting a thin tube with a light and camera on the end (thoracoscope) through a small incision in the chest wall.  After the procedure, you may have a chest tube draining fluid from the surgical area. The chest tube will be closely monitored for signs of fluid or air buildup in your lungs. This information is not intended to replace advice given to you by your health care provider. Make sure you discuss any questions you have with your health care provider. Document Released: 02/26/2013 Document Revised: 10/14/2017 Document Reviewed: 10/18/2016 Elsevier Patient Education  [employer].   Lung Resection, Care After This sheet gives you information about how to care for yourself after your procedure. Your health care provider may also give you more specific instructions. If you have problems or questions, contact your health care provider. What can I expect after the procedure? After the procedure, it is common to have:  Pain in your throat and near your incisions.  Pain when taking deep breaths.  Nausea.  Tiredness (fatigue). Follow these instructions at home:  Medicines  Take  over-the-counter and prescription medicines only as told by your health care provider.  If you were prescribed an antibiotic medicine, take it as told by your health care provider. Do not stop taking the antibiotic even if you start to feel better.  If you are taking prescription pain medicine, take actions to prevent or treat constipation. Your health care provider may recommend that you: ? Drink enough fluid to keep your urine pale yellow. ? Eat foods that are high in fiber, such as fresh fruits and vegetables, whole grains, and beans. ? Limit foods that are high in fat and processed sugars, such as fried or sweet foods. ? Take an over-the-counter or prescription medicine for constipation. Incision care  Follow instructions from your health care provider about how to take care of your incisions. Make sure you: ? Wash your hands with soap and water before you change your bandage (dressing). If soap and water are not available, use hand sanitizer. ? Change your dressing as told by your health care provider. ? Leave stitches (sutures), skin glue, or adhesive strips in place. These skin closures may need to stay in place for 2 weeks or longer. If adhesive strip edges start to loosen and curl up, you may trim the loose edges. Do not remove adhesive strips completely unless your health care provider tells you to do that.  Check your incision area every day for signs of infection. Check for: ? Redness, swelling, or pain. ? Fluid or blood. ? Pus or a bad smell. ? Warmth.  Do not take baths, swim, or use a hot tub until your health care provider approves. Ask your health care provider if you may take showers. Preventing pneumonia   Do breathing exercises as instructed by your health care provider. Doing this helps prevent lung infection (pneumonia).  Try to breathe deeply and cough as told by your health care provider. Holding a pillow firmly over your ribs may help with discomfort.  If you  were given an incentive spirometer in the hospital, continue to use it as directed by your health care provider.  Participate in pulmonary rehabilitation as directed by your health care provider. This is a program that combines education, exercise, and support from a team of specialists. The goal is to help you heal and get back  to your normal activities as soon as possible. Activity  Rest as told by your health care provider.  Avoid sitting for a long time without moving. Get up to take short walks every 1-2 hours. This is important to improve blood flow and breathing. Ask for help if you feel weak or unsteady.  Ask your health care provider what activities are safe for you.  Do not lift anything that is heavier than 10 lb (4.5 kg), or the limit that you are told, until your health care provider says that it is safe.  Return to a normal diet and activities as told by your health care provider. General instructions  Wear compression stockings as told by your health care provider. These stockings help to prevent blood clots and reduce swelling in your legs.  If you have a chest tube, care for it as instructed by your health care provider. Do not travel by airplane during the 2 weeks after your chest tube is removed, or until your health care provider says that this is safe.  Do not use any products that contain nicotine or tobacco, such as cigarettes and e-cigarettes. These can delay healing after surgery. If you need help quitting, ask your health care provider.  Do not drive until your health care provider approves.  Do not drive or use heavy machinery while taking prescription pain medicine.  Keep all follow-up visits as told by your health care provider. This is important. Contact a health care provider if you:  Have redness, swelling, or pain around your incision.  Have fluid or blood coming from your incision.  Have pus or a bad smell coming from your incision or  bandage.  Have an incision that feels warm to the touch.  Have a fever or chills.  Notice that your incision is breaking open.  Cough up blood or pus, or you develop a cough that produces bad-smelling sputum.  Have pain or swelling in your legs.  Have increasing pain that is not controlled with medicine.  Have trouble managing any of the tubes that have been left in place after surgery. Get help right away if you:  Have chest pain or an irregular or rapid heartbeat.  Feel weak, light-headed, or dizzy.  Have shortness of breath or difficulty breathing.  Have persistent nausea or vomiting.  Have a rash. These symptoms may represent a serious problem that is an emergency. Do not wait to see if the symptoms will go away. Get medical help right away. Call your local emergency services (911 in the [place] Do not drive yourself to the hospital. Summary  After lung resection surgery, it is common to have pain around your incisions, pain when taking deep breaths, nausea, and fatigue.  Follow instructions from your health care provider about how to take care of your incisions.  Be sure to contact your health care provider if you have any redness or swelling around your incision area or if blood, pus, or other fluid drains from your incision. This information is not intended to replace advice given to you by your health care provider. Make sure you discuss any questions you have with your health care provider. Document Released: 05/21/2005 Document Revised: 12/25/2018 Document Reviewed: 11/14/2017 Elsevier Patient Education  2020 [employer].

## 2019-10-16 ENCOUNTER — Encounter: Payer: Self-pay | Admitting: *Deleted

## 2019-10-16 ENCOUNTER — Other Ambulatory Visit: Payer: Self-pay | Admitting: *Deleted

## 2019-10-16 DIAGNOSIS — R911 Solitary pulmonary nodule: Secondary | ICD-10-CM

## 2019-10-17 NOTE — Progress Notes (Signed)
[place], [place] AT [facility] OF [address] [place] [address] Phone: [phone] Fax: [place], [place] [address] [place] [address] Phone: [phone] Fax: [phone]      Your procedure is scheduled on Monday, December 7th.  Report to [facility] Main Entrance "A" at 5:30 A.M., and check in at the Admitting  office.  Call this number if you have problems the morning of surgery:  [phone]  Call [phone] if you have any questions prior to your surgery date Monday-Friday 8am-4pm    Remember:  Do not eat or drink after midnight the night before your surgery     Take these medicines the morning of surgery with A SIP OF WATER   Tylenol - if needed  Eye drops - if needed  Nasal spray - if needed  7 days prior to surgery STOP taking any Aspirin (unless otherwise instructed by your surgeon), Aleve, Naproxen, Ibuprofen, Motrin, Advil, Goody's, BC's, all herbal medications, fish oil, and all vitamins.    The Morning of Surgery  Do not wear jewelry.  Do not wear lotions, powders, colognes, or deodorant  Men may shave face and neck.  Do not bring valuables to the hospital.  [facility] is not responsible for any belongings or valuables.  If you are a smoker, DO NOT Smoke 24 hours prior to surgery  If you wear a CPAP at night please bring your mask, tubing, and machine the morning of surgery   Remember that you must have someone to transport you home after your surgery, and remain with you for 24 hours if you are discharged the same day.   Please bring cases for contacts, glasses, hearing aids, dentures or bridgework because it cannot be worn into surgery.    Leave your suitcase in the car.  After surgery it may be brought to your room.  For patients admitted to the hospital, discharge time will be determined by  your treatment team.  Patients discharged the day of surgery will not be allowed to drive home.    Special instructions:   - Preparing For Surgery  Before surgery, you can play an important role. Because skin is not sterile, your skin needs to be as free of germs as possible. You can reduce the number of germs on your skin by washing with CHG (chlorahexidine gluconate) Soap before surgery.  CHG is an antiseptic cleaner which kills germs and bonds with the skin to continue killing germs even after washing.    Oral Hygiene is also important to reduce your risk of infection.  Remember - BRUSH YOUR TEETH THE MORNING OF SURGERY WITH YOUR REGULAR TOOTHPASTE  Please do not use if you have an allergy to CHG or antibacterial soaps. If your skin becomes reddened/irritated stop using the CHG.  Do not shave (including legs and underarms) for at least 48 hours prior to first CHG shower. It is OK to shave your face.  Please follow these instructions carefully.   1. Shower the NIGHT BEFORE SURGERY and the MORNING OF SURGERY with CHG Soap.   2. If you chose to wash your hair, wash your hair first as usual with your normal shampoo.  3. After you shampoo, rinse your hair and body thoroughly to remove the shampoo.  4. Use CHG as you would any other liquid soap.  You can apply CHG directly to the skin and wash gently with a scrungie or a clean washcloth.   5. Apply the CHG Soap to your body ONLY FROM THE NECK DOWN.  Do not use on open wounds or open sores. Avoid contact with your eyes, ears, mouth and genitals (private parts). Wash Face and genitals (private parts)  with your normal soap.   6. Wash thoroughly, paying special attention to the area where your surgery will be performed.  7. Thoroughly rinse your body with warm water from the neck down.  8. DO NOT shower/wash with your normal soap after using and rinsing off the CHG Soap.  9. Pat yourself dry with a CLEAN TOWEL.  10. Wear CLEAN  PAJAMAS to bed the night before surgery, wear comfortable clothes the morning of surgery  11. Place CLEAN SHEETS on your bed the night of your first shower and DO NOT SLEEP WITH PETS.    Day of Surgery:  Please shower the morning of surgery with the CHG soap Do not apply any deodorants/lotions. Please wear clean clothes to the hospital/surgery center.   Remember to brush your teeth WITH YOUR REGULAR TOOTHPASTE.   Please read over the following fact sheets that you were given.

## 2019-10-18 ENCOUNTER — Other Ambulatory Visit: Payer: Self-pay

## 2019-10-18 ENCOUNTER — Other Ambulatory Visit (HOSPITAL_COMMUNITY)
Admission: RE | Admit: 2019-10-18 | Discharge: 2019-10-18 | Disposition: A | Discharge status: Home / Self Care | Payer: 59 | Source: Ambulatory Visit | Attending: Cardiothoracic Surgery | Admitting: Cardiothoracic Surgery

## 2019-10-18 ENCOUNTER — Encounter (HOSPITAL_COMMUNITY)
Admission: RE | Admit: 2019-10-18 | Discharge: 2019-10-18 | Disposition: A | Discharge status: Home / Self Care | Payer: 59 | Source: Ambulatory Visit | Attending: Cardiothoracic Surgery | Admitting: Cardiothoracic Surgery

## 2019-10-18 ENCOUNTER — Other Ambulatory Visit: Payer: Self-pay | Admitting: *Deleted

## 2019-10-18 ENCOUNTER — Encounter (HOSPITAL_COMMUNITY): Payer: Self-pay

## 2019-10-18 DIAGNOSIS — I491 Atrial premature depolarization: Secondary | ICD-10-CM | POA: Diagnosis not present

## 2019-10-18 DIAGNOSIS — Z01810 Encounter for preprocedural cardiovascular examination: Secondary | ICD-10-CM | POA: Diagnosis not present

## 2019-10-18 DIAGNOSIS — Z01812 Encounter for preprocedural laboratory examination: Secondary | ICD-10-CM | POA: Diagnosis present

## 2019-10-18 DIAGNOSIS — Z20828 Contact with and (suspected) exposure to other viral communicable diseases: Secondary | ICD-10-CM | POA: Diagnosis not present

## 2019-10-18 DIAGNOSIS — R911 Solitary pulmonary nodule: Secondary | ICD-10-CM | POA: Insufficient documentation

## 2019-10-18 LAB — COMPREHENSIVE METABOLIC PANEL
ALT: 109 U/L — ABNORMAL HIGH (ref 0–44)
AST: 93 U/L — ABNORMAL HIGH (ref 15–41)
Albumin: 3.6 g/dL (ref 3.5–5.0)
Alkaline Phosphatase: 121 U/L (ref 38–126)
Anion gap: 11 (ref 5–15)
BUN: 14 mg/dL (ref 6–20)
CO2: 19 mmol/L — ABNORMAL LOW (ref 22–32)
Calcium: 9.3 mg/dL (ref 8.9–10.3)
Chloride: 109 mmol/L (ref 98–111)
Creatinine, Ser: 1.38 mg/dL — ABNORMAL HIGH (ref 0.61–1.24)
GFR calc Af Amer: >60 mL/min (ref >60)
GFR calc non Af Amer: 56 mL/min — ABNORMAL LOW (ref >60)
Glucose, Bld: 108 mg/dL — ABNORMAL HIGH (ref 70–99)
Potassium: 3.9 mmol/L (ref 3.5–5.1)
Sodium: 139 mmol/L (ref 135–145)
Total Bilirubin: 0.7 mg/dL (ref 0.3–1.2)
Total Protein: 7 g/dL (ref 6.5–8.1)

## 2019-10-18 LAB — URINALYSIS, ROUTINE W REFLEX MICROSCOPIC
Bilirubin Urine: NEGATIVE
Glucose, UA: NEGATIVE mg/dL
Hgb urine dipstick: NEGATIVE
Ketones, ur: NEGATIVE mg/dL
Leukocytes,Ua: NEGATIVE
Nitrite: NEGATIVE
Protein, ur: NEGATIVE mg/dL
Specific Gravity, Urine: >1.03 — ABNORMAL HIGH (ref 1.005–1.030)
pH: 5 (ref 5.0–8.0)

## 2019-10-18 LAB — SURGICAL PCR SCREEN
MRSA, PCR: NEGATIVE
Staphylococcus aureus: NEGATIVE

## 2019-10-18 LAB — CBC
HCT: 45.4 % (ref 39.0–52.0)
Hemoglobin: 15.1 g/dL (ref 13.0–17.0)
MCH: 31 pg (ref 26.0–34.0)
MCHC: 33.3 g/dL (ref 30.0–36.0)
MCV: 93.2 fL (ref 80.0–100.0)
Platelets: 226 10*3/uL (ref 150–400)
RBC: 4.87 MIL/uL (ref 4.22–5.81)
RDW: 13 % (ref 11.5–15.5)
WBC: 2.7 10*3/uL — ABNORMAL LOW (ref 4.0–10.5)
nRBC: 0 % (ref 0.0–0.2)

## 2019-10-18 LAB — APTT: aPTT: 30 seconds (ref 24–36)

## 2019-10-18 LAB — BLOOD GAS, ARTERIAL
Acid-base deficit: 1.1 mmol/L (ref 0.0–2.0)
Bicarbonate: 22.9 mmol/L (ref 20.0–28.0)
Drawn by: 421801
FIO2: 21
O2 Saturation: 97.1 %
Patient temperature: 37
pCO2 arterial: 36.7 mmHg (ref 32.0–48.0)
pH, Arterial: 7.411 (ref 7.350–7.450)
pO2, Arterial: 90.1 mmHg (ref 83.0–108.0)

## 2019-10-18 LAB — TYPE AND SCREEN
ABO/RH(D): O POS
Antibody Screen: NEGATIVE

## 2019-10-18 LAB — PROTIME-INR
INR: 0.9 (ref 0.8–1.2)
Prothrombin Time: 12.5 seconds (ref 11.4–15.2)

## 2019-10-18 LAB — ABO/RH: ABO/RH(D): O POS

## 2019-10-18 NOTE — Progress Notes (Signed)
The proposed treatment discussed in cancer conference 10/18/2019 is for discussion purpose only and is not a binding recommendation.  The patient was not physically examined nor present for their treatment options. Therefore, final treatment plans cannot be decided.

## 2019-10-18 NOTE — Progress Notes (Signed)
PCP - gary sherrill   oncology Cardiologist -na           Chest x-ray - dos EKG - today Stress Test - na ECHO - 9/13 Cardiac Cath - na  -      Aspirin Instructions:stop   -    COVID TEST- today   Anesthesia review: cardiac hx  Patient denies shortness of breath, fever, cough and chest pain at PAT appointment   All instructions explained to the patient, with a verbal understanding of the material. Patient agrees to go over the instructions while at home for a better understanding. Patient also instructed to self quarantine after being tested for COVID-19. The opportunity to ask questions was provided.

## 2019-10-19 NOTE — Progress Notes (Signed)
Anesthesia Chart Review: Remote cardiac eval for palpitations. Event monitor showed only PVCs. Echo showed normal LV function, normal wall motion.  Proep labs reviewed, unremarkable.   EKG 10/18/19: NSR. Rate 71.  TTE 2013: - Left ventricle: The cavity size was normal. Borderline  septal hypertrophy. Systolic function was normal. The  estimated ejection fraction was in the range of 55% to  60%. Wall motion was normal; there were no regional wall  motion abnormalities.  - Atrial septum: No defect or patent foramen ovale was  identified.    Wynonia Musty [facility] Short Stay Center/Anesthesiology Phone [phone] 10/19/2019 9:46 AM

## 2019-10-19 NOTE — Anesthesia Preprocedure Evaluation (Addendum)
Anesthesia Evaluation  Patient identified by MRN, date of birth, ID band Patient awake    Reviewed: Allergy & Precautions, NPO status , Patient's Chart, lab work & pertinent test results  Airway Mallampati: II  TM Distance: >3 FB Neck ROM: Full    Dental  (+) Teeth Intact, Dental Advisory Given   Pulmonary former smoker,    breath sounds clear to auscultation       Cardiovascular  Rhythm:Regular Rate:Normal     Neuro/Psych    GI/Hepatic   Endo/Other    Renal/GU      Musculoskeletal   Abdominal   Peds  Hematology   Anesthesia Other Findings   Reproductive/Obstetrics                            Anesthesia Physical Anesthesia Plan  ASA: III  Anesthesia Plan: General   Post-op Pain Management:    Induction: Intravenous  PONV Risk Score and Plan: Ondansetron and Dexamethasone  Airway Management Planned: Double Lumen EBT  Additional Equipment: Arterial line and CVP  Intra-op Plan:   Post-operative Plan: Extubation in OR  Informed Consent: I have reviewed the patients History and Physical, chart, labs and discussed the procedure including the risks, benefits and alternatives for the proposed anesthesia with the patient or authorized representative who has indicated his/her understanding and acceptance.     Dental advisory given  Plan Discussed with: CRNA and Anesthesiologist  Anesthesia Plan Comments: (Remote cardiac eval for palpitations. Event monitor showed only PVCs. Echo showed normal LV function, normal wall motion.  Proep labs reviewed, unremarkable.   EKG 10/18/19: NSR. Rate 71.  TTE 2013: - Left ventricle: The cavity size was normal. Borderline  septal hypertrophy. Systolic function was normal. The  estimated ejection fraction was in the range of 55% to  60%. Wall motion was normal; there were no regional wall  motion abnormalities.  - Atrial septum: No defect or  patent foramen ovale was  identified.  )       Anesthesia Quick Evaluation

## 2019-10-21 LAB — NOVEL CORONAVIRUS, NAA (HOSP ORDER, SEND-OUT TO REF LAB; TAT 18-24 HRS): SARS-CoV-2, NAA: NOT DETECTED

## 2019-10-22 ENCOUNTER — Inpatient Hospital Stay (HOSPITAL_COMMUNITY): Payer: 59 | Admitting: Anesthesiology

## 2019-10-22 ENCOUNTER — Other Ambulatory Visit: Payer: Self-pay

## 2019-10-22 ENCOUNTER — Inpatient Hospital Stay (HOSPITAL_COMMUNITY): Payer: 59 | Admitting: Physician Assistant

## 2019-10-22 ENCOUNTER — Inpatient Hospital Stay (HOSPITAL_COMMUNITY): Payer: 59

## 2019-10-22 ENCOUNTER — Encounter (HOSPITAL_COMMUNITY): Payer: Self-pay | Admitting: *Deleted

## 2019-10-22 ENCOUNTER — Inpatient Hospital Stay (HOSPITAL_COMMUNITY)
Admission: RE | Admit: 2019-10-22 | Discharge: 2019-10-25 | DRG: 165 | Disposition: A | Discharge status: Home / Self Care | Payer: 59 | Attending: Cardiothoracic Surgery | Admitting: Cardiothoracic Surgery

## 2019-10-22 ENCOUNTER — Encounter (HOSPITAL_COMMUNITY)
Admission: RE | Disposition: A | Discharge status: Home / Self Care | Payer: Self-pay | Source: Home / Self Care | Attending: Cardiothoracic Surgery

## 2019-10-22 ENCOUNTER — Other Ambulatory Visit (HOSPITAL_COMMUNITY): Payer: BC Managed Care – PPO

## 2019-10-22 DIAGNOSIS — Z87442 Personal history of urinary calculi: Secondary | ICD-10-CM | POA: Diagnosis not present

## 2019-10-22 DIAGNOSIS — F419 Anxiety disorder, unspecified: Secondary | ICD-10-CM | POA: Diagnosis present

## 2019-10-22 DIAGNOSIS — C7802 Secondary malignant neoplasm of left lung: Secondary | ICD-10-CM | POA: Diagnosis present

## 2019-10-22 DIAGNOSIS — Z85048 Personal history of other malignant neoplasm of rectum, rectosigmoid junction, and anus: Secondary | ICD-10-CM | POA: Diagnosis not present

## 2019-10-22 DIAGNOSIS — Z960 Presence of urogenital implants: Secondary | ICD-10-CM | POA: Diagnosis present

## 2019-10-22 DIAGNOSIS — R911 Solitary pulmonary nodule: Secondary | ICD-10-CM | POA: Diagnosis present

## 2019-10-22 DIAGNOSIS — Z801 Family history of malignant neoplasm of trachea, bronchus and lung: Secondary | ICD-10-CM | POA: Diagnosis not present

## 2019-10-22 DIAGNOSIS — Z932 Ileostomy status: Secondary | ICD-10-CM

## 2019-10-22 DIAGNOSIS — Z20828 Contact with and (suspected) exposure to other viral communicable diseases: Secondary | ICD-10-CM | POA: Diagnosis present

## 2019-10-22 DIAGNOSIS — Z09 Encounter for follow-up examination after completed treatment for conditions other than malignant neoplasm: Secondary | ICD-10-CM

## 2019-10-22 DIAGNOSIS — Z9221 Personal history of antineoplastic chemotherapy: Secondary | ICD-10-CM | POA: Diagnosis not present

## 2019-10-22 DIAGNOSIS — K219 Gastro-esophageal reflux disease without esophagitis: Secondary | ICD-10-CM | POA: Diagnosis present

## 2019-10-22 DIAGNOSIS — Z9049 Acquired absence of other specified parts of digestive tract: Secondary | ICD-10-CM | POA: Diagnosis not present

## 2019-10-22 DIAGNOSIS — Z87891 Personal history of nicotine dependence: Secondary | ICD-10-CM

## 2019-10-22 DIAGNOSIS — R222 Localized swelling, mass and lump, trunk: Secondary | ICD-10-CM

## 2019-10-22 DIAGNOSIS — J939 Pneumothorax, unspecified: Secondary | ICD-10-CM

## 2019-10-22 HISTORY — PX: VIDEO BRONCHOSCOPY: SHX5072

## 2019-10-22 HISTORY — PX: VIDEO ASSISTED THORACOSCOPY (VATS)/WEDGE RESECTION: SHX6174

## 2019-10-22 LAB — GLUCOSE, CAPILLARY: Glucose-Capillary: 151 mg/dL — ABNORMAL HIGH (ref 70–99)

## 2019-10-22 SURGERY — BRONCHOSCOPY, VIDEO-ASSISTED
Anesthesia: General | Site: Chest

## 2019-10-22 MED ORDER — BUPIVACAINE HCL (PF) 0.5 % IJ SOLN
INTRAMUSCULAR | Status: AC
  Filled 2019-10-22: qty 30

## 2019-10-22 MED ORDER — MIDAZOLAM HCL 2 MG/2ML IJ SOLN
INTRAMUSCULAR | Status: AC
  Filled 2019-10-22: qty 2

## 2019-10-22 MED ORDER — ROCURONIUM BROMIDE 10 MG/ML (PF) SYRINGE
PREFILLED_SYRINGE | INTRAVENOUS | Status: AC
  Filled 2019-10-22: qty 10

## 2019-10-22 MED ORDER — ACETAMINOPHEN 500 MG PO TABS
1000.0000 mg | ORAL_TABLET | Freq: Four times a day (QID) | ORAL | Status: DC
  Administered 2019-10-22 – 2019-10-25 (×10): 1000 mg via ORAL
  Filled 2019-10-22 (×10): qty 2

## 2019-10-22 MED ORDER — FENTANYL CITRATE (PF) 100 MCG/2ML IJ SOLN: 25.0000 ug | INTRAMUSCULAR | Status: DC | PRN

## 2019-10-22 MED ORDER — FENTANYL CITRATE (PF) 250 MCG/5ML IJ SOLN
INTRAMUSCULAR | Status: AC
  Filled 2019-10-22: qty 5

## 2019-10-22 MED ORDER — PANTOPRAZOLE SODIUM 40 MG PO TBEC
40.0000 mg | DELAYED_RELEASE_TABLET | Freq: Every day | ORAL | Status: DC
  Administered 2019-10-23 – 2019-10-25 (×3): 40 mg via ORAL
  Filled 2019-10-22 (×3): qty 1

## 2019-10-22 MED ORDER — PHENYLEPHRINE 40 MCG/ML (10ML) SYRINGE FOR IV PUSH (FOR BLOOD PRESSURE SUPPORT)
PREFILLED_SYRINGE | INTRAVENOUS | Status: DC | PRN
  Administered 2019-10-22: 80 ug via INTRAVENOUS

## 2019-10-22 MED ORDER — DIPHENHYDRAMINE HCL (SLEEP) 25 MG PO TABS: 50.0000 mg | ORAL_TABLET | Freq: Every evening | ORAL | Status: DC | PRN

## 2019-10-22 MED ORDER — DEXAMETHASONE SODIUM PHOSPHATE 10 MG/ML IJ SOLN
INTRAMUSCULAR | Status: DC | PRN
  Administered 2019-10-22: 10 mg via INTRAVENOUS

## 2019-10-22 MED ORDER — ONDANSETRON HCL 4 MG/2ML IJ SOLN
INTRAMUSCULAR | Status: AC
  Filled 2019-10-22: qty 2

## 2019-10-22 MED ORDER — OXYCODONE HCL 5 MG PO TABS
ORAL_TABLET | ORAL | Status: AC
  Filled 2019-10-22: qty 2

## 2019-10-22 MED ORDER — DIPHENHYDRAMINE HCL 12.5 MG/5ML PO ELIX
12.5000 mg | ORAL_SOLUTION | Freq: Four times a day (QID) | ORAL | Status: DC | PRN
  Filled 2019-10-22: qty 5

## 2019-10-22 MED ORDER — OXYCODONE HCL 5 MG PO TABS: 5.0000 mg | ORAL_TABLET | Freq: Once | ORAL | Status: DC | PRN

## 2019-10-22 MED ORDER — MIDAZOLAM HCL 5 MG/5ML IJ SOLN
INTRAMUSCULAR | Status: DC | PRN
  Administered 2019-10-22: 2 mg via INTRAVENOUS

## 2019-10-22 MED ORDER — OXYCODONE HCL 5 MG PO TABS
5.0000 mg | ORAL_TABLET | ORAL | Status: DC | PRN
  Administered 2019-10-22: 10 mg via ORAL
  Filled 2019-10-22: qty 1

## 2019-10-22 MED ORDER — ONDANSETRON HCL 4 MG/2ML IJ SOLN: 4.0000 mg | Freq: Once | INTRAMUSCULAR | Status: DC | PRN

## 2019-10-22 MED ORDER — PHENYLEPHRINE HCL-NACL 10-0.9 MG/250ML-% IV SOLN
INTRAVENOUS | Status: DC | PRN
  Administered 2019-10-22: 25 ug/min via INTRAVENOUS

## 2019-10-22 MED ORDER — POTASSIUM CHLORIDE IN NACL 20-0.9 MEQ/L-% IV SOLN
INTRAVENOUS | Status: DC
  Administered 2019-10-22 – 2019-10-23 (×2): via INTRAVENOUS
  Filled 2019-10-22 (×2): qty 1000

## 2019-10-22 MED ORDER — FENTANYL 40 MCG/ML IV SOLN
INTRAVENOUS | Status: DC
  Administered 2019-10-22: 150 ug via INTRAVENOUS
  Administered 2019-10-22: 1000 ug via INTRAVENOUS
  Administered 2019-10-22: 45 ug via INTRAVENOUS
  Administered 2019-10-23: 15 ug via INTRAVENOUS
  Filled 2019-10-22: qty 25

## 2019-10-22 MED ORDER — LACTATED RINGERS IV SOLN: INTRAVENOUS | Status: DC | PRN

## 2019-10-22 MED ORDER — SENNOSIDES-DOCUSATE SODIUM 8.6-50 MG PO TABS
1.0000 | ORAL_TABLET | Freq: Every day | ORAL | Status: DC
  Administered 2019-10-23 – 2019-10-24 (×2): 1 via ORAL
  Filled 2019-10-22 (×2): qty 1

## 2019-10-22 MED ORDER — BUPIVACAINE LIPOSOME 1.3 % IJ SUSP
INTRAMUSCULAR | Status: DC | PRN
  Administered 2019-10-22: 55 mL

## 2019-10-22 MED ORDER — CEFAZOLIN SODIUM-DEXTROSE 2-4 GM/100ML-% IV SOLN
2.0000 g | INTRAVENOUS | Status: AC
  Administered 2019-10-22: 2 g via INTRAVENOUS

## 2019-10-22 MED ORDER — LIDOCAINE 2% (20 MG/ML) 5 ML SYRINGE
INTRAMUSCULAR | Status: AC
  Filled 2019-10-22: qty 5

## 2019-10-22 MED ORDER — INSULIN ASPART 100 UNIT/ML ~~LOC~~ SOLN
0.0000 [IU] | Freq: Four times a day (QID) | SUBCUTANEOUS | Status: DC
  Administered 2019-10-22 – 2019-10-25 (×5): 2 [IU] via SUBCUTANEOUS

## 2019-10-22 MED ORDER — OXYCODONE HCL 5 MG/5ML PO SOLN: 5.0000 mg | Freq: Once | ORAL | Status: DC | PRN

## 2019-10-22 MED ORDER — PROPOFOL 10 MG/ML IV BOLUS
INTRAVENOUS | Status: AC
  Filled 2019-10-22: qty 20

## 2019-10-22 MED ORDER — DIPHENHYDRAMINE HCL 50 MG/ML IJ SOLN: 12.5000 mg | Freq: Four times a day (QID) | INTRAMUSCULAR | Status: DC | PRN

## 2019-10-22 MED ORDER — BUPIVACAINE LIPOSOME 1.3 % IJ SUSP
20.0000 mL | Freq: Once | INTRAMUSCULAR | Status: DC
  Filled 2019-10-22: qty 20

## 2019-10-22 MED ORDER — ENOXAPARIN SODIUM 40 MG/0.4ML ~~LOC~~ SOLN
40.0000 mg | SUBCUTANEOUS | Status: DC
  Administered 2019-10-23 – 2019-10-24 (×2): 40 mg via SUBCUTANEOUS
  Filled 2019-10-22 (×2): qty 0.4

## 2019-10-22 MED ORDER — PROPOFOL 10 MG/ML IV BOLUS
INTRAVENOUS | Status: DC | PRN
  Administered 2019-10-22: 180 mg via INTRAVENOUS

## 2019-10-22 MED ORDER — 0.9 % SODIUM CHLORIDE (POUR BTL) OPTIME
TOPICAL | Status: DC | PRN
  Administered 2019-10-22: 2000 mL

## 2019-10-22 MED ORDER — LACTATED RINGERS IV SOLN
INTRAVENOUS | Status: DC | PRN
  Administered 2019-10-22: 07:00:00 via INTRAVENOUS

## 2019-10-22 MED ORDER — CEFAZOLIN SODIUM 1 G IJ SOLR
INTRAMUSCULAR | Status: AC
  Filled 2019-10-22: qty 20

## 2019-10-22 MED ORDER — ROCURONIUM BROMIDE 10 MG/ML (PF) SYRINGE
PREFILLED_SYRINGE | INTRAVENOUS | Status: DC | PRN
  Administered 2019-10-22: 20 mg via INTRAVENOUS
  Administered 2019-10-22: 60 mg via INTRAVENOUS
  Administered 2019-10-22 (×2): 20 mg via INTRAVENOUS

## 2019-10-22 MED ORDER — ROCURONIUM BROMIDE 10 MG/ML (PF) SYRINGE
PREFILLED_SYRINGE | INTRAVENOUS | Status: AC
  Filled 2019-10-22: qty 20

## 2019-10-22 MED ORDER — DEXAMETHASONE SODIUM PHOSPHATE 10 MG/ML IJ SOLN
INTRAMUSCULAR | Status: AC
  Filled 2019-10-22: qty 1

## 2019-10-22 MED ORDER — FENTANYL CITRATE (PF) 100 MCG/2ML IJ SOLN
INTRAMUSCULAR | Status: DC | PRN
  Administered 2019-10-22: 100 ug via INTRAVENOUS
  Administered 2019-10-22 (×3): 50 ug via INTRAVENOUS

## 2019-10-22 MED ORDER — CEFAZOLIN SODIUM-DEXTROSE 2-4 GM/100ML-% IV SOLN
INTRAVENOUS | Status: AC
  Filled 2019-10-22: qty 100

## 2019-10-22 MED ORDER — ACETAMINOPHEN 160 MG/5ML PO SOLN: 1000.0000 mg | Freq: Four times a day (QID) | ORAL | Status: DC

## 2019-10-22 MED ORDER — DIPHENHYDRAMINE HCL 25 MG PO CAPS: 50.0000 mg | ORAL_CAPSULE | Freq: Every evening | ORAL | Status: DC | PRN

## 2019-10-22 MED ORDER — SODIUM CHLORIDE 0.9% FLUSH: 9.0000 mL | INTRAVENOUS | Status: DC | PRN

## 2019-10-22 MED ORDER — TRAMADOL HCL 50 MG PO TABS
50.0000 mg | ORAL_TABLET | Freq: Four times a day (QID) | ORAL | Status: DC | PRN
  Administered 2019-10-23 – 2019-10-24 (×3): 50 mg via ORAL
  Filled 2019-10-22 (×3): qty 1

## 2019-10-22 MED ORDER — SUGAMMADEX SODIUM 200 MG/2ML IV SOLN
INTRAVENOUS | Status: DC | PRN
  Administered 2019-10-22: 195 mg via INTRAVENOUS

## 2019-10-22 MED ORDER — NALOXONE HCL 0.4 MG/ML IJ SOLN: 0.4000 mg | INTRAMUSCULAR | Status: DC | PRN

## 2019-10-22 MED ORDER — ASPIRIN EFFERVESCENT 325 MG PO TBEF: 650.0000 mg | EFFERVESCENT_TABLET | Freq: Four times a day (QID) | ORAL | Status: DC | PRN

## 2019-10-22 MED ORDER — ONDANSETRON HCL 4 MG/2ML IJ SOLN
INTRAMUSCULAR | Status: DC | PRN
  Administered 2019-10-22: 4 mg via INTRAVENOUS

## 2019-10-22 MED ORDER — LACTATED RINGERS IV SOLN
INTRAVENOUS | Status: DC | PRN
  Administered 2019-10-22: 08:00:00 via INTRAVENOUS

## 2019-10-22 MED ORDER — CEFAZOLIN SODIUM-DEXTROSE 2-4 GM/100ML-% IV SOLN
2.0000 g | Freq: Three times a day (TID) | INTRAVENOUS | Status: AC
  Administered 2019-10-22 – 2019-10-23 (×2): 2 g via INTRAVENOUS
  Filled 2019-10-22 (×3): qty 100

## 2019-10-22 MED ORDER — ONDANSETRON HCL 4 MG/2ML IJ SOLN: 4.0000 mg | Freq: Four times a day (QID) | INTRAMUSCULAR | Status: DC | PRN

## 2019-10-22 MED ORDER — BISACODYL 5 MG PO TBEC
10.0000 mg | DELAYED_RELEASE_TABLET | Freq: Every day | ORAL | Status: DC
  Filled 2019-10-22: qty 2

## 2019-10-22 MED ORDER — BUPIVACAINE-EPINEPHRINE (PF) 0.5% -1:200000 IJ SOLN
INTRAMUSCULAR | Status: AC
  Filled 2019-10-22: qty 30

## 2019-10-22 SURGICAL SUPPLY — 96 items
APPLICATOR COTTON TIP 6 STRL (MISCELLANEOUS) ×2 IMPLANT
APPLICATOR COTTON TIP 6IN STRL (MISCELLANEOUS) ×3
APPLICATOR TIP COSEAL (VASCULAR PRODUCTS) IMPLANT
APPLICATOR TIP EXT COSEAL (VASCULAR PRODUCTS) IMPLANT
BLADE CLIPPER SURG (BLADE) ×3 IMPLANT
BRUSH CYTOL CELLEBRITY 1.5X140 (MISCELLANEOUS) IMPLANT
CANISTER SUCT 3000ML PPV (MISCELLANEOUS) ×3 IMPLANT
CATH THORACIC 28FR (CATHETERS) IMPLANT
CATH THORACIC 36FR (CATHETERS) IMPLANT
CATH THORACIC 36FR RT ANG (CATHETERS) IMPLANT
CLIP VESOCCLUDE MED 6/CT (CLIP) IMPLANT
CONN ST 1/4X3/8  BEN (MISCELLANEOUS) ×1
CONN ST 1/4X3/8 BEN (MISCELLANEOUS) ×2 IMPLANT
CONT SPEC 4OZ CLIKSEAL STRL BL (MISCELLANEOUS) ×21 IMPLANT
COVER BACK TABLE 60X90IN (DRAPES) IMPLANT
DERMABOND ADVANCED (GAUZE/BANDAGES/DRESSINGS) ×1
DERMABOND ADVANCED .7 DNX12 (GAUZE/BANDAGES/DRESSINGS) ×2 IMPLANT
DISSECTOR BLUNT TIP ENDO 5MM (MISCELLANEOUS) IMPLANT
DRAIN CHANNEL 28F RND 3/8 FF (WOUND CARE) ×3 IMPLANT
DRAIN CHANNEL 32F RND 10.7 FF (WOUND CARE) IMPLANT
DRAPE LAPAROSCOPIC ABDOMINAL (DRAPES) ×3 IMPLANT
ELECT BLADE 4.0 EZ CLEAN MEGAD (MISCELLANEOUS) ×3
ELECT BLADE 6.5 EXT (BLADE) ×3 IMPLANT
ELECT REM PT RETURN 9FT ADLT (ELECTROSURGICAL) ×3
ELECTRODE BLDE 4.0 EZ CLN MEGD (MISCELLANEOUS) ×2 IMPLANT
ELECTRODE REM PT RTRN 9FT ADLT (ELECTROSURGICAL) ×2 IMPLANT
FORCEPS BIOP RJ4 1.8 (CUTTING FORCEPS) IMPLANT
GAUZE SPONGE 4X4 12PLY STRL (GAUZE/BANDAGES/DRESSINGS) ×3 IMPLANT
GAUZE SPONGE 4X4 12PLY STRL LF (GAUZE/BANDAGES/DRESSINGS) ×3 IMPLANT
GLOVE BIO SURGEON STRL SZ 6 (GLOVE) ×9 IMPLANT
GLOVE BIO SURGEON STRL SZ 6.5 (GLOVE) ×15 IMPLANT
GLOVE BIOGEL PI IND STRL 6 (GLOVE) ×8 IMPLANT
GLOVE BIOGEL PI IND STRL 6.5 (GLOVE) ×8 IMPLANT
GLOVE BIOGEL PI INDICATOR 6 (GLOVE) ×4
GLOVE BIOGEL PI INDICATOR 6.5 (GLOVE) ×4
GOWN STRL REUS W/ TWL LRG LVL3 (GOWN DISPOSABLE) ×16 IMPLANT
GOWN STRL REUS W/TWL LRG LVL3 (GOWN DISPOSABLE) ×8
KIT BASIN OR (CUSTOM PROCEDURE TRAY) ×3 IMPLANT
KIT CLEAN ENDO COMPLIANCE (KITS) IMPLANT
KIT SUCTION CATH 14FR (SUCTIONS) ×3 IMPLANT
KIT TURNOVER KIT B (KITS) ×3 IMPLANT
MARKER SKIN DUAL TIP RULER LAB (MISCELLANEOUS) IMPLANT
NEEDLE HYPO 25GX1X1/2 BEV (NEEDLE) ×3 IMPLANT
NEEDLE SPNL 18GX3.5 QUINCKE PK (NEEDLE) ×3 IMPLANT
NS IRRIG 1000ML POUR BTL (IV SOLUTION) ×6 IMPLANT
OIL SILICONE PENTAX (PARTS (SERVICE/REPAIRS)) ×3 IMPLANT
PACK CHEST (CUSTOM PROCEDURE TRAY) ×3 IMPLANT
PAD ARMBOARD 7.5X6 YLW CONV (MISCELLANEOUS) ×9 IMPLANT
PASSER SUT SWANSON 36MM LOOP (INSTRUMENTS) ×3 IMPLANT
RELOAD TRI 45 ART MED THCK PUR (STAPLE) ×21 IMPLANT
SCISSORS LAP 5X35 DISP (ENDOMECHANICALS) IMPLANT
SEALANT SURG COSEAL 4ML (VASCULAR PRODUCTS) IMPLANT
SEALANT SURG COSEAL 8ML (VASCULAR PRODUCTS) IMPLANT
SOL ANTI FOG 6CC (MISCELLANEOUS) ×2 IMPLANT
SOLUTION ANTI FOG 6CC (MISCELLANEOUS) ×1
SPONGE TONSIL TAPE 1 RFD (DISPOSABLE) IMPLANT
STAPLER ECHELON POWERED (MISCELLANEOUS) IMPLANT
STAPLER ENDO GIA 12MM SHORT (STAPLE) ×3 IMPLANT
STOPCOCK 4 WAY LG BORE MALE ST (IV SETS) ×3 IMPLANT
SUT PROLENE 3 0 SH DA (SUTURE) IMPLANT
SUT PROLENE 4 0 RB 1 (SUTURE)
SUT PROLENE 4-0 RB1 .5 CRCL 36 (SUTURE) IMPLANT
SUT SILK  1 MH (SUTURE) ×2
SUT SILK 1 MH (SUTURE) ×4 IMPLANT
SUT SILK 1 TIES 10X30 (SUTURE) IMPLANT
SUT SILK 2 0 SH (SUTURE) IMPLANT
SUT SILK 2 0SH CR/8 30 (SUTURE) IMPLANT
SUT STEEL 1 (SUTURE) IMPLANT
SUT VIC AB 0 CTX 18 (SUTURE) IMPLANT
SUT VIC AB 1 CTX 18 (SUTURE) ×3 IMPLANT
SUT VIC AB 1 CTX 36 (SUTURE)
SUT VIC AB 1 CTX36XBRD ANBCTR (SUTURE) IMPLANT
SUT VIC AB 2-0 CTX 36 (SUTURE) ×3 IMPLANT
SUT VIC AB 2-0 UR6 27 (SUTURE) ×3 IMPLANT
SUT VIC AB 3-0 SH 8-18 (SUTURE) IMPLANT
SUT VIC AB 3-0 X1 27 (SUTURE) ×3 IMPLANT
SUT VICRYL 0 UR6 27IN ABS (SUTURE) IMPLANT
SUT VICRYL 2 TP 1 (SUTURE) ×3 IMPLANT
SYR 10ML LL (SYRINGE) ×3 IMPLANT
SYR 20ML ECCENTRIC (SYRINGE) ×3 IMPLANT
SYR 50ML LL SCALE MARK (SYRINGE) ×3 IMPLANT
SYR 5ML LL (SYRINGE) IMPLANT
SYSTEM SAHARA CHEST DRAIN ATS (WOUND CARE) ×3 IMPLANT
TAPE CLOTH SURG 4X10 WHT LF (GAUZE/BANDAGES/DRESSINGS) ×3 IMPLANT
TAPE UMBILICAL COTTON 1/8X30 (MISCELLANEOUS) IMPLANT
TOWEL GREEN STERILE (TOWEL DISPOSABLE) ×6 IMPLANT
TOWEL GREEN STERILE FF (TOWEL DISPOSABLE) IMPLANT
TRAP SPECIMEN MUCOUS 40CC (MISCELLANEOUS) IMPLANT
TRAY FOLEY MTR SLVR 16FR STAT (SET/KITS/TRAYS/PACK) ×3 IMPLANT
TROCAR BLADELESS 5M (ENDOMECHANICALS) ×3 IMPLANT
TROCAR XCEL 12X100 BLDLESS (ENDOMECHANICALS) ×6 IMPLANT
TUBE CONNECTING 20X1/4 (TUBING) ×3 IMPLANT
TUBING EXTENTION W/L.L. (IV SETS) ×3 IMPLANT
TUBING LAP HI FLOW INSUFFLATIO (TUBING) ×3 IMPLANT
WATER STERILE IRR 1000ML POUR (IV SOLUTION) ×6 IMPLANT
YANKAUER SUCT BULB TIP NO VENT (SUCTIONS) ×3 IMPLANT

## 2019-10-22 NOTE — Anesthesia Postprocedure Evaluation (Signed)
Anesthesia Post Note  Patient: JOVAN COLLIGAN  Procedure(s) Performed: VIDEO BRONCHOSCOPY using anesthesia scope (N/A ) VIDEO ASSISTED THORACOSCOPY (VATS) with mini thoracotomy/Left Upper Lobe Wedge RESECTION x2, left pleural biopsy, lymph node sampling, intercoastal nerve block (Left Chest)     Patient location during evaluation: PACU Anesthesia Type: General Level of consciousness: awake and alert Pain management: pain level controlled Vital Signs Assessment: post-procedure vital signs reviewed and stable Respiratory status: spontaneous breathing, nonlabored ventilation, respiratory function stable and patient connected to nasal cannula oxygen Cardiovascular status: blood pressure returned to baseline and stable Postop Assessment: no apparent nausea or vomiting Anesthetic complications: no    Last Vitals:  Vitals:   10/22/19 1620 10/22/19 1705  BP: 133/84   Pulse: 94   Resp: 20 20  Temp: 36.6 C   SpO2: 97% 98%    Last Pain:  Vitals:   10/22/19 1808  TempSrc:   PainSc: 5                  Salah Burlison COKER

## 2019-10-22 NOTE — H&P (Signed)
This is H& P       [address]       [place] [address]             [phone]                    Craig [name] [place] Medical Record #[id number] Date of Birth: 06-26-1961 Referring: Ladell Pier, MD Primary Care: Patient, No Pcp Per Primary Cardiologist: No primary care provider on file.  Chief Complaint:    Chief Complaint  Patient presents with  . Lung Lesion    new LULobe lung lesion per CT C/A/P 09/18/19, PET 10/03/19...h/o RECTAL CANCER/reversal of colostomy     Cancer Staging No matching staging information was found for the patient.  History of Present Illness:    Craig [name] 58 y.o. male has been  seen in the office for for a new left upper lobe lung nodule 1.2 cm in size hypermetabolic.  Mass was not present on a CT scan of the chest 1 year ago.  The patient has been treated for known rectal cancer first diagnosed in November 2018.  Subsequent follow-up CT scans showed a new lung lesion and the patient was referred for consideration of resection, of which could be a solitary lung metastasis versus primary lung cancer.  The patient is a very distant smoker but quit at the age of 41.  He currently works full-time as a Administrator.  He denies any shortness of breath wheezing or other respiratory difficulty.  He has had no previous cardiac history.    From Cancer history: 1. Rectal cancer ? Colonoscopy 09/08/2017, rectal mass at 5 cm from the anal verge, biopsy confirmed invasive adenocarcinoma ? Staging CTs 09/15/2017, rectal mass and perirectal lymphadenopathy, indeterminate right upper lobe nodule ? Elevated CEA ? MRI pelvis 10/07/2017-5.3 cm circumferential lower rectal mass with perirectal extension along the inferior mesenteric vein;T3, N1; distance from tumor to the anal sphincter is 5 cm. ? Initiation of radiation and concurrent Xeloda chemotherapy 10/12/2017;completion of radiation/Xeloda 11/23/2017. ? Surgery 02/01/18 per Dr. Nadeen Landau; laparoscopic lower anterior rectosigmoid resection, coloanal anastomosis, diverting loop ileostomy, flexible sigmoidoscopy; ypT3, pN0 ? Developed low-grade fever and abdominal pain on POD 4, CT [facility] on 02/06/18 and 02/09/18 shows small loculated fluid collections in RLQ and presacral space, consulted IR for transgluteal presacral abscess drain, treated with IV zosyn then transitioned to po cipro and flagyl ? Follow-up CT 03/24/2018-2.2 x 4.9 x 4.0 cm thick-walled presacral gas and fluid collection/abscess, minimally increased status post removal of surgical drain. No evidence of recurrent or metastatic disease. ? Cycle 1 adjuvant Xeloda 04/12/2018 ? Cycle 2 adjuvant Xeloda 05/03/2018 ? Cycle 3 adjuvant Xeloda 06/05/2018 (Xeloda dose reduced to 1500 mg twice daily secondary to mild neutropenia) ? Cycle 4adjuvant Xeloda 06/26/2018 ? Cycle 5 adjuvant Xeloda 07/18/2018 ? CTs 08/21/2018-no evidence of tumor recurrence or metastatic disease in the chest, abdomen or pelvis. Presacral soft tissue thickening and some calcifications compatible with postradiation changes. Stable probablybenign 3 mm right upper lobe nodule. ? CTs 09/18/2019-new 1.2 cm pulmonary nodule within the left upper lobe; stable 3 mm nodule right upper lobe. ? PET scan 10/03/2019-hypermetabolic left upper lobe nodule, no evidence of local recurrence, no other evidence of metastatic disease  Current Activity/ Functional Status:  Patient is independent with mobility/ambulation, transfers, ADL's, IADL's.   Zubrod Score: At the time of surgery this patient's most appropriate activity status/level should be described as: [x]   0    Normal activity, no symptoms []     1    Restricted in physical strenuous activity but ambulatory, able to do out light work []     2    Ambulatory and capable of self care, unable to do work activities, up and about               >50 % of waking hours                              []     3    Only limited self  care, in bed greater than 50% of waking hours []     4    Completely disabled, no self care, confined to bed or chair []     5    Moribund   Past Medical History:  Diagnosis Date  . Anxiety    Truck driver  . Dysrhythmia    palpitations  . Elevated serum creatinine    1.39 in 06/2012  . Family history of cancer   . GERD (gastroesophageal reflux disease)   . History of kidney stones   . Ileostomy in place [facility])   . Palpitations    + chest pressure; PVCs  . Rectal bleeding 01/07/2017  . Rectal cancer ([place]) 09/30/2017    Past Surgical History:  Procedure Laterality Date  . COLONOSCOPY N/A 09/08/2017   Procedure: COLONOSCOPY;  Surgeon: Rogene Houston, MD;  [facility];  Service: Endoscopy;  Laterality: N/A;  . COLONOSCOPY N/A 12/14/2018   Procedure: COLONOSCOPY;  Surgeon: Rogene Houston, MD;  Location: [facility];  Service: Endoscopy;  Laterality: N/A;  830  . CYSTOSCOPY WITH RETROGRADE PYELOGRAM, URETEROSCOPY AND STENT PLACEMENT Bilateral 02/01/2018   Procedure: CYSTOSCOPY WITH RETROGRADE PYELOGRAM, URETEROSCOPY AND URETERAL CATHETERS BILATERAL;  Surgeon: Alexis Frock, MD;  Location: [facility];  Service: Urology;  Laterality: Bilateral;  . FLEXIBLE SIGMOIDOSCOPY N/A 09/13/2018   Procedure: FLEXIBLE SIGMOIDOSCOPY;  Surgeon: Ileana Roup, MD;  Location: [facility];  Service: General;  Laterality: N/A;  . FLEXIBLE SIGMOIDOSCOPY N/A 05/30/2019   Procedure: Beryle Quant;  Surgeon: Ileana Roup, MD;  Location: [facility];  Service: General;  Laterality: N/A;  . LAPAROSCOPIC LOW ANTERIOR RESECTION N/A 02/01/2018   Procedure: LAPAROSCOPIC  LOW ANTERIOR RECTOSIGMOID RESECTION, COLOANAL ANASTOMOSIS;  DIVERTING LOOP ILESTOMY; FLEXIBLE SIGMOIDOSCOPY;  Surgeon: Ileana Roup, MD;  Location: [facility];  Service: General;  Laterality: N/A;    Family History  Problem Relation Age of Onset  . Cancer Mother 49       Lung cancer  . Leukemia Sister 93   . Cancer Maternal Aunt      Social History   Tobacco Use  Smoking Status Former Smoker  Smokeless Tobacco Never Used  Tobacco Comment   quit at age 61    Social History   Substance and Sexual Activity  Alcohol Use Yes   Comment: occ- none since 08/2017     No Known Allergies  Current Facility-Administered Medications  Medication Dose Route Frequency Provider Last Rate Last Dose  . bupivacaine liposome (EXPAREL) 1.3 % injection 266 mg  20 mL Infiltration Once Grace Isaac, MD      . ceFAZolin (ANCEF) 2-4 GM/100ML-% IVPB           . ceFAZolin (ANCEF) IVPB 2g/100 mL premix  2 g Intravenous 30 min Pre-Op Grace Isaac, MD  Review of Systems:     Cardiac Review of Systems: [Y] = yes  or   [ N ] = no   Chest Pain [ n   ]  Resting SOB [ n  ] Exertional SOB  [  n]  Orthopnea [ n ]   Pedal Edema [ n  ]    Palpitations [ n ] Syncope  [ n ]   Presyncope [ n  ]   General Review of Systems: [Y] = yes [  ]=no Constitional: recent weight change [  ];  Wt loss over the last 3 months [   ] anorexia [  ]; fatigue [  ]; nausea [  ]; night sweats [  ]; fever [  ]; or chills [  ];           Eye : blurred vision [  ]; diplopia [   ]; vision changes [  ];  Amaurosis fugax[  ]; Resp: cough [  ];  wheezing[  ];  hemoptysis[  ]; shortness of breath[  ]; paroxysmal nocturnal dyspnea[  ]; dyspnea on exertion[  ]; or orthopnea[  ];  GI:  gallstones[  ], vomiting[  ];  dysphagia[  ]; melena[  ];  hematochezia [  ]; heartburn[  ];   Hx of  Colonoscopy[y  ]; GU: kidney stones [  ]; hematuria[  ];   dysuria [  ];  nocturia[  ];  history of     obstruction [  ]; urinary frequency [  ]             Skin: rash, swelling[  ];, hair loss[  ];  peripheral edema[  ];  or itching[  ]; Musculosketetal: myalgias[  ];  joint swelling[  ];  joint erythema[  ];  joint pain[  ];  back pain[  ];  Heme/Lymph: bruising[  ];  bleeding[  ];  anemia[  ];  Neuro: TIA[  ];  headaches[  ];  stroke[  ];   vertigo[  ];  seizures[  ];   paresthesias[  ];  difficulty walking[  ];  Psych:depression[  ]; anxiety[  ];  Endocrine: diabetes[  ];  thyroid dysfunction[  ];  Immunizations: Flu up to date [  ]; Pneumococcal up to date [  ];  Other:     PHYSICAL EXAMINATION: BP (!) 158/98   Pulse 76   Temp 98.1 F (36.7 C) (Oral)   Resp 18   Ht 5\' 9"  (1.753 m)   Wt 97.5 kg   SpO2 99%   BMI 31.75 kg/m  General appearance: alert, cooperative and no distress Head: Normocephalic, without obvious abnormality, atraumatic Neck: no adenopathy, no carotid bruit, no JVD, supple, symmetrical, trachea midline and thyroid not enlarged, symmetric, no tenderness/mass/nodules Lymph nodes: Cervical, supraclavicular, and axillary nodes normal. Resp: clear to auscultation bilaterally Cardio: regular rate and rhythm, S1, S2 normal, no murmur, click, rub or gallop GI: soft, non-tender; bowel sounds normal; no masses,  no organomegaly Extremities: extremities normal, atraumatic, no cyanosis or edema and Homans sign is negative, no sign of DVT Neurologic: Grossly normal  Diagnostic Studies & Laboratory data:     Recent Radiology Findings:   Ct Chest W Contrast  Result Date: 09/18/2019 CLINICAL DATA:  Preop for ileostomy take-down. History of rectal cancer resected on 02/01/2018. EXAM: CT CHEST, ABDOMEN, AND PELVIS WITH CONTRAST TECHNIQUE: Multidetector CT imaging of the chest, abdomen and pelvis was performed following the standard protocol during bolus  administration of intravenous contrast. CONTRAST:  144mL ISOVUE-370 IOPAMIDOL (ISOVUE-370) INJECTION 76% COMPARISON:  CT chest abdomen pelvis dated 08/21/2018. FINDINGS: CT CHEST FINDINGS Cardiovascular: Heart size is normal. No pericardial effusion. No thoracic aortic aneurysm or evidence of aortic dissection. Mediastinum/Nodes: No mass or enlarged lymph nodes seen within the mediastinum, perihilar or axillary regions. Esophagus appears normal. Trachea and central  bronchi are unremarkable. Lungs/Pleura: New 1.2 cm nodule within the LEFT upper lobe (series 4, image 57). Stable 3 mm nodule within the RIGHT upper lobe (image 63). Lungs are otherwise clear. Musculoskeletal: No acute or suspicious osseous finding. CT ABDOMEN PELVIS FINDINGS Hepatobiliary: No focal mass or lesion is seen within the liver. Subtle low-density area within the RIGHT liver lobe is likely artifactual or related to geographic fatty infiltration. Gallbladder appears normal. No bile duct dilatation. Pancreas: Unremarkable. No pancreatic ductal dilatation or surrounding inflammatory changes. Spleen: Normal in size without focal abnormality. Adrenals/Urinary Tract: Adrenal glands appear normal. Kidneys are unremarkable without mass, stone or hydronephrosis. Bladder is decompressed. Stomach/Bowel: Surgical changes at the rectum, compatible with the given history of rectal resection. Associated chronic presacral thickening. No obstruction, inflammation or other complicating features at the surgical anastomosis. No dilated large or small bowel loops. RIGHT lower quadrant ostomy in place. Appendix is normal. Stomach is unremarkable, partially decompressed. Vascular/Lymphatic: No vascular abnormality. No enlarged lymph nodes seen in the abdomen or pelvis. Reproductive: Prostate is unremarkable. Other: No free fluid or abscess collection seen. No free intraperitoneal air. Musculoskeletal: No acute or suspicious osseous finding. IMPRESSION: 1. New 1.2 cm pulmonary nodule within the LEFT upper lobe (series 4, image 57), new compared to the chest CT of 08/21/2018, highly suspicious for neoplastic process (favor metastatic disease). Recommend further characterization with PET-CT and/or tissue sampling. 2. Surgical anastomosis at the rectum. No obstruction, inflammation or other complicating feature at the surgical anastomosis. 3. RIGHT lower quadrant ostomy in place. No bowel obstruction or inflammation. 4. Remainder  of the chest, abdomen and pelvis CT is unremarkable, as detailed above. No bowel obstruction. No evidence of additional metastasis within the abdomen or pelvis. These results will be called to the ordering clinician or representative by the Radiologist Assistant, and communication documented in the PACS or zVision Dashboard. Electronically Signed   By: Franki Cabot M.D.   On: 09/18/2019 14:25    Nm Pet Image Initial (pi) Skull Base To Thigh  Result Date: 10/03/2019 CLINICAL DATA:  Subsequent treatment strategy for rectal carcinoma. Post rectal surgery. Diverting ileostomy EXAM: NUCLEAR MEDICINE PET SKULL BASE TO THIGH TECHNIQUE: 10.5 mCi F-18 FDG was injected intravenously. Full-ring PET imaging was performed from the skull base to thigh after the radiotracer. CT data was obtained and used for attenuation correction and anatomic localization. Fasting blood glucose: 100 mg/dl COMPARISON:  CT 09/18/2019 FINDINGS: Mediastinal blood pool activity: SUV max 2.5 Liver activity: SUV max NA NECK: No hypermetabolic lymph nodes in the neck. Incidental CT findings: none CHEST: Within the LEFT upper lobe 10 mm nodule (image 19) is not changed in size from 10 mm on 09/18/2019. This LEFT upper lobe nodule has intense metabolic activity with SUV max equal 6.0. No additional hypermetabolic pulmonary nodules in LEFT or RIGHT lung. No hypermetabolic mediastinal lymph nodes. Incidental CT findings: none ABDOMEN/PELVIS: No abnormal hypermetabolic activity within the liver, pancreas, adrenal glands, or spleen. No hypermetabolic lymph nodes in the abdomen or pelvis. No abnormal metabolic activity at the low rectal anastomosis. No hypermetabolic abdominopelvic lymph nodes. Incidental CT findings: Loop ileostomy without complication  SKELETON: No focal hypermetabolic activity to suggest skeletal metastasis. Incidental CT findings: none IMPRESSION: 1. Hypermetabolic nodule in the LEFT upper lobe consistent with solitary rectal  carcinoma metastasis. 2. No evidence of local recurrence at the low rectal anastomosis. 3. No evidence of metastatic disease in the abdomen pelvis. Electronically Signed   By: Suzy Bouchard M.D.   On: 10/03/2019 16:07     I have independently reviewed the above radiology studies  and reviewed the findings with the patient.   Recent Lab Findings: Lab Results  Component Value Date   WBC 2.7 (L) 10/18/2019   HGB 15.1 10/18/2019   HCT 45.4 10/18/2019   PLT 226 10/18/2019   GLUCOSE 108 (H) 10/18/2019   ALT 109 (H) 10/18/2019   AST 93 (H) 10/18/2019   NA 139 10/18/2019   K 3.9 10/18/2019   CL 109 10/18/2019   CREATININE 1.38 (H) 10/18/2019   BUN 14 10/18/2019   CO2 19 (L) 10/18/2019   TSH 1.442 07/25/2012   INR 0.9 10/18/2019   HGBA1C 5.4 01/30/2018       Assessment / Plan:   #1 rectal cancer previously treated with surgical resection and chemotherapy.  Now patient appears to have a solitary nodule left upper lobe that is highly suspicious for metastatic rectal cancer.  I reviewed the scans with the patient and have recommended to him that we proceed with surgical resection of the left upper lobe lung nodule, as this appears to be the only site of disease if this is a lung metastasis, there is an outside chance it could be a primary lung cancer early stage.  Risks of surgery were discussed with the patient including bleeding, respiratory failure, cardiac valves, pulmonary emboli, prolonged air leak involvement discussed.  We also discussed the pros and cons of attempted biopsy now but the suspicious nature of the lesion would lead to resection even if he had a negative biopsy.  The goals risks and alternatives of the planned surgical procedure Procedure(s): VIDEO BRONCHOSCOPY (N/A) VIDEO ASSISTED THORACOSCOPY (VATS)/LUNG RESECTION (Left)  have been discussed with the patient in detail. The risks of the procedure including death, infection, stroke, myocardial infarction, bleeding, blood  transfusion have all been discussed specifically.  I have quoted [name] a 2 % of perioperative mortality and a complication rate as high as 30%. The patient's questions have been answered.Rolland Porter Gola is willing  to proceed with the planned procedure.   Grace Isaac MD      [address] [place] [address] Office [phone]     10/22/2019 7:03 AM

## 2019-10-22 NOTE — Anesthesia Procedure Notes (Signed)
Procedure Name: Intubation Date/Time: 10/22/2019 7:32 AM Performed by: Lowella Dell, CRNA Pre-anesthesia Checklist: Patient identified, Emergency Drugs available, Suction available and Patient being monitored Patient Re-evaluated:Patient Re-evaluated prior to induction Oxygen Delivery Method: Circle System Utilized Preoxygenation: Pre-oxygenation with 100% oxygen Induction Type: IV induction Ventilation: Mask ventilation without difficulty and Oral airway inserted - appropriate to patient size Laryngoscope Size: Mac and 4 Grade View: Grade I Tube type: Oral Endobronchial tube: EBT position confirmed by auscultation, Double lumen EBT, EBT position confirmed by fiberoptic bronchoscope and Left and 39 Fr Laser Tube: Cuffed inflated with minimal occlusive pressure - saline Number of attempts: 1 Airway Equipment and Method: Stylet,  Oral airway and Fiberoptic brochoscope (VIVA Sight-DL EBT used) Placement Confirmation: ETT inserted through vocal cords under direct vision,  positive ETCO2 and breath sounds checked- equal and bilateral Secured at: 29 cm Tube secured with: Tape Dental Injury: Teeth and Oropharynx as per pre-operative assessment

## 2019-10-22 NOTE — Anesthesia Procedure Notes (Signed)
Central Venous Catheter Insertion Performed by: Roberts Gaudy, MD, anesthesiologist Start/End12/05/2019 7:00 AM, 10/22/2019 7:10 AM Patient location: Pre-op. Preanesthetic checklist: patient identified, IV checked, site marked, risks and benefits discussed, surgical consent, monitors and equipment checked, pre-op evaluation, timeout performed and anesthesia consent Lidocaine 1% used for infiltration and patient sedated Hand hygiene performed  and maximum sterile barriers used  Catheter size: 8 Fr Total catheter length 16. Central line was placed.Double lumen Procedure performed using ultrasound guided technique. Ultrasound Notes:image(s) printed for medical record Attempts: 1 Following insertion, dressing applied and line sutured. Post procedure assessment: blood return through all ports  Patient tolerated the procedure well with no immediate complications. Additional procedure comments: No ultrasound used.

## 2019-10-22 NOTE — Brief Op Note (Addendum)
      [address]       [place] [address]             [phone]     10/22/2019  11:18 AM  PATIENT:  Craig [name]  58 y.o. male  PRE-OPERATIVE DIAGNOSIS:  Left upper lobe lung lesion  POST-OPERATIVE DIAGNOSIS:  Metastatic left upper lobe lung lesion  PROCEDURE:   VIDEO BRONCHOSCOPY using anesthesia scope, LEFT VIDEO ASSISTED THORACOSCOPY (VATS) with left mini thoracotomy,Left Upper Lobe Wedge RESECTION x2, left pleural biopsy, lymph node sampling, intercoastal nerve block (Left)  SURGEON:  Surgeon(s) and Role:    Grace Isaac, MD - Primary  PHYSICIAN ASSISTANT: Lars Pinks PA-C  ANESTHESIA:   general  EBL:  100 mL   BLOOD ADMINISTERED:none  DRAINS: 28 Blake drain placed in the left pleural space   LOCAL MEDICATIONS USED:  OTHER Exparel  SPECIMEN:  Source of Specimen:  Left pleural biopsy, lymph nodes, wedge LUL x 2  DISPOSITION OF SPECIMEN:  Pathology-left pleural biopsy with atypcial features. Wedge LUL #2 mass positive for adenocarcinoma  COUNTS CORRECT:  YES  DICTATION: .Dragon Dictation  PLAN OF CARE: Admit to inpatient   PATIENT DISPOSITION:  PACU - hemodynamically stable.   Delay start of Pharmacological VTE agent (>24hrs) due to surgical blood loss or risk of bleeding: yes

## 2019-10-22 NOTE — Anesthesia Procedure Notes (Signed)
Arterial Line Insertion Start/End12/05/2019 7:00 AM, 10/22/2019 7:05 AM Performed by: Lowella Dell, CRNA, CRNA  Patient location: Pre-op. Preanesthetic checklist: patient identified, IV checked, site marked, risks and benefits discussed, surgical consent, monitors and equipment checked, pre-op evaluation, timeout performed and anesthesia consent Lidocaine 1% used for infiltration and patient sedated Left, radial was placed Catheter size: 20 G Hand hygiene performed  and maximum sterile barriers used   Attempts: 1 Procedure performed without using ultrasound guided technique. Following insertion, Biopatch and dressing applied. Post procedure assessment: normal  Patient tolerated the procedure well with no immediate complications.

## 2019-10-22 NOTE — Transfer of Care (Signed)
Immediate Anesthesia Transfer of Care Note  Patient: Craig [name]  Procedure(s) Performed: VIDEO BRONCHOSCOPY using anesthesia scope (N/A ) VIDEO ASSISTED THORACOSCOPY (VATS) with mini thoracotomy/Left Upper Lobe Wedge RESECTION x2, left pleural biopsy, lymph node sampling, intercoastal nerve block (Left Chest)  Patient Location: PACU  Anesthesia Type:General  Level of Consciousness: awake, oriented and patient cooperative  Airway & Oxygen Therapy: Patient Spontanous Breathing and Patient connected to face mask oxygen  Post-op Assessment: Report given to RN, Post -op Vital signs reviewed and stable and Patient moving all extremities  Post vital signs: Reviewed and stable  Last Vitals:  Vitals Value Taken Time  BP 134/91 10/22/19 1140  Temp    Pulse 83 10/22/19 1142  Resp 19 10/22/19 1142  SpO2 100 % 10/22/19 1142  Vitals shown include unvalidated device data.  Last Pain:  Vitals:   10/22/19 0624  TempSrc:   PainSc: 3       Patients Stated Pain Goal: 4 (19/41/74 0814)  Complications: No apparent anesthesia complications

## 2019-10-22 NOTE — Progress Notes (Signed)
Craig [name] [address]       [place] [address]             [phone]                    Craig [name] [place] Medical Record #[id number] Date of Birth: 10-31-1961 Referring: Craig Pier, MD Primary Care: Patient, No Pcp Per Primary Cardiologist: No primary care provider on file.  Chief Complaint:    Chief Complaint  Patient presents with  . Lung Lesion    new LULobe lung lesion per CT C/A/P 09/18/19, PET 10/03/19...h/o RECTAL CANCER/reversal of colostomy     Cancer Staging No matching staging information was found for the patient.  History of Present Illness:    Craig [name] 58 y.o. male has been  seen in the office for for a new left upper lobe lung nodule 1.2 cm in size hypermetabolic.  Mass was not present on a CT scan of the chest 1 year ago.  The patient has been treated for known rectal cancer first diagnosed in November 2018.  Subsequent follow-up CT scans showed a new lung lesion and the patient was referred for consideration of resection, of which could be a solitary lung metastasis versus primary lung cancer.  The patient is a very distant smoker but quit at the age of 62.  He currently works full-time as a Administrator.  He denies any shortness of breath wheezing or other respiratory difficulty.  He has had no previous cardiac history.    From Cancer history: 1. Rectal cancer ? Colonoscopy 09/08/2017, rectal mass at 5 cm from the anal verge, biopsy confirmed invasive adenocarcinoma ? Staging CTs 09/15/2017, rectal mass and perirectal lymphadenopathy, indeterminate right upper lobe nodule ? Elevated CEA ? MRI pelvis 10/07/2017-5.3 cm circumferential lower rectal mass with perirectal extension along the inferior mesenteric vein;T3, N1; distance from tumor to the anal sphincter is 5 cm. ? Initiation of radiation and concurrent Xeloda chemotherapy 10/12/2017;completion of radiation/Xeloda 11/23/2017. ? Surgery 02/01/18 per Dr. Nadeen Landau;  laparoscopic lower anterior rectosigmoid resection, coloanal anastomosis, diverting loop ileostomy, flexible sigmoidoscopy; ypT3, pN0 ? Developed low-grade fever and abdominal pain on POD 4, CT [facility] on 02/06/18 and 02/09/18 shows small loculated fluid collections in RLQ and presacral space, consulted IR for transgluteal presacral abscess drain, treated with IV zosyn then transitioned to po cipro and flagyl ? Follow-up CT 03/24/2018-2.2 x 4.9 x 4.0 cm thick-walled presacral gas and fluid collection/abscess, minimally increased status post removal of surgical drain. No evidence of recurrent or metastatic disease. ? Cycle 1 adjuvant Xeloda 04/12/2018 ? Cycle 2 adjuvant Xeloda 05/03/2018 ? Cycle 3 adjuvant Xeloda 06/05/2018 (Xeloda dose reduced to 1500 mg twice daily secondary to mild neutropenia) ? Cycle 4adjuvant Xeloda 06/26/2018 ? Cycle 5 adjuvant Xeloda 07/18/2018 ? CTs 08/21/2018-no evidence of tumor recurrence or metastatic disease in the chest, abdomen or pelvis. Presacral soft tissue thickening and some calcifications compatible with postradiation changes. Stable probablybenign 3 mm right upper lobe nodule. ? CTs 09/18/2019-new 1.2 cm pulmonary nodule within the left upper lobe; stable 3 mm nodule right upper lobe. ? PET scan 10/03/2019-hypermetabolic left upper lobe nodule, no evidence of local recurrence, no other evidence of metastatic disease  Current Activity/ Functional Status:  Patient is independent with mobility/ambulation, transfers, ADL's, IADL's.   Zubrod Score: At the time of surgery this patient's most appropriate activity status/level should be described as: [x]     0  Normal activity, no symptoms []     1    Restricted in physical strenuous activity but ambulatory, able to do out light work []     2    Ambulatory and capable of self care, unable to do work activities, up and about               >50 % of waking hours                              []     3    Only limited self care,  in bed greater than 50% of waking hours []     4    Completely disabled, no self care, confined to bed or chair []     5    Moribund   Past Medical History:  Diagnosis Date  . Anxiety    Truck driver  . Dysrhythmia    palpitations  . Elevated serum creatinine    1.39 in 06/2012  . Family history of cancer   . GERD (gastroesophageal reflux disease)   . History of kidney stones   . Ileostomy in place [facility])   . Palpitations    + chest pressure; PVCs  . Rectal bleeding 01/07/2017  . Rectal cancer ([place]) 09/30/2017    Past Surgical History:  Procedure Laterality Date  . COLONOSCOPY N/A 09/08/2017   Procedure: COLONOSCOPY;  Surgeon: Rogene Houston, MD;  [facility];  Service: Endoscopy;  Laterality: N/A;  . COLONOSCOPY N/A 12/14/2018   Procedure: COLONOSCOPY;  Surgeon: Rogene Houston, MD;  Location: [facility];  Service: Endoscopy;  Laterality: N/A;  830  . CYSTOSCOPY WITH RETROGRADE PYELOGRAM, URETEROSCOPY AND STENT PLACEMENT Bilateral 02/01/2018   Procedure: CYSTOSCOPY WITH RETROGRADE PYELOGRAM, URETEROSCOPY AND URETERAL CATHETERS BILATERAL;  Surgeon: Alexis Frock, MD;  Location: [facility];  Service: Urology;  Laterality: Bilateral;  . FLEXIBLE SIGMOIDOSCOPY N/A 09/13/2018   Procedure: FLEXIBLE SIGMOIDOSCOPY;  Surgeon: Ileana Roup, MD;  Location: [facility];  Service: General;  Laterality: N/A;  . FLEXIBLE SIGMOIDOSCOPY N/A 05/30/2019   Procedure: Beryle Quant;  Surgeon: Ileana Roup, MD;  Location: [facility];  Service: General;  Laterality: N/A;  . LAPAROSCOPIC LOW ANTERIOR RESECTION N/A 02/01/2018   Procedure: LAPAROSCOPIC  LOW ANTERIOR RECTOSIGMOID RESECTION, COLOANAL ANASTOMOSIS;  DIVERTING LOOP ILESTOMY; FLEXIBLE SIGMOIDOSCOPY;  Surgeon: Ileana Roup, MD;  Location: [facility];  Service: General;  Laterality: N/A;    Family History  Problem Relation Age of Onset  . Cancer Mother 60       Lung cancer  . Leukemia Sister 9  .  Cancer Maternal Aunt      Social History   Tobacco Use  Smoking Status Former Smoker  Smokeless Tobacco Never Used  Tobacco Comment   quit at age 59    Social History   Substance and Sexual Activity  Alcohol Use Yes   Comment: occ- none since 08/2017     No Known Allergies  Current Facility-Administered Medications  Medication Dose Route Frequency Provider Last Rate Last Dose  . bupivacaine liposome (EXPAREL) 1.3 % injection 266 mg  20 mL Infiltration Once Craig Isaac, MD      . ceFAZolin (ANCEF) 2-4 GM/100ML-% IVPB           . ceFAZolin (ANCEF) IVPB 2g/100 mL premix  2 g Intravenous 30 min Pre-Op Craig Isaac, MD          Review  of Systems:     Cardiac Review of Systems: [Y] = yes  or   [ N ] = no   Chest Pain [ n   ]  Resting SOB [ n  ] Exertional SOB  [  n]  Orthopnea [ n ]   Pedal Edema [ n  ]    Palpitations [ n ] Syncope  [ n ]   Presyncope [ n  ]   General Review of Systems: [Y] = yes [  ]=no Constitional: recent weight change [  ];  Wt loss over the last 3 months [   ] anorexia [  ]; fatigue [  ]; nausea [  ]; night sweats [  ]; fever [  ]; or chills [  ];           Eye : blurred vision [  ]; diplopia [   ]; vision changes [  ];  Amaurosis fugax[  ]; Resp: cough [  ];  wheezing[  ];  hemoptysis[  ]; shortness of breath[  ]; paroxysmal nocturnal dyspnea[  ]; dyspnea on exertion[  ]; or orthopnea[  ];  GI:  gallstones[  ], vomiting[  ];  dysphagia[  ]; melena[  ];  hematochezia [  ]; heartburn[  ];   Hx of  Colonoscopy[y  ]; GU: kidney stones [  ]; hematuria[  ];   dysuria [  ];  nocturia[  ];  history of     obstruction [  ]; urinary frequency [  ]             Skin: rash, swelling[  ];, hair loss[  ];  peripheral edema[  ];  or itching[  ]; Musculosketetal: myalgias[  ];  joint swelling[  ];  joint erythema[  ];  joint pain[  ];  back pain[  ];  Heme/Lymph: bruising[  ];  bleeding[  ];  anemia[  ];  Neuro: TIA[  ];  headaches[  ];  stroke[  ];   vertigo[  ];  seizures[  ];   paresthesias[  ];  difficulty walking[  ];  Psych:depression[  ]; anxiety[  ];  Endocrine: diabetes[  ];  thyroid dysfunction[  ];  Immunizations: Flu up to date [  ]; Pneumococcal up to date [  ];  Other:     PHYSICAL EXAMINATION: BP (!) 158/98   Pulse 76   Temp 98.1 F (36.7 C) (Oral)   Resp 18   Ht 5\' 9"  (1.753 m)   Wt 97.5 kg   SpO2 99%   BMI 31.75 kg/m  General appearance: alert, cooperative and no distress Head: Normocephalic, without obvious abnormality, atraumatic Neck: no adenopathy, no carotid bruit, no JVD, supple, symmetrical, trachea midline and thyroid not enlarged, symmetric, no tenderness/mass/nodules Lymph nodes: Cervical, supraclavicular, and axillary nodes normal. Resp: clear to auscultation bilaterally Cardio: regular rate and rhythm, S1, S2 normal, no murmur, click, rub or gallop GI: soft, non-tender; bowel sounds normal; no masses,  no organomegaly Extremities: extremities normal, atraumatic, no cyanosis or edema and Homans sign is negative, no sign of DVT Neurologic: Grossly normal  Diagnostic Studies & Laboratory data:     Recent Radiology Findings:   Ct Chest W Contrast  Result Date: 09/18/2019 CLINICAL DATA:  Preop for ileostomy take-down. History of rectal cancer resected on 02/01/2018. EXAM: CT CHEST, ABDOMEN, AND PELVIS WITH CONTRAST TECHNIQUE: Multidetector CT imaging of the chest, abdomen and pelvis was performed following the standard protocol during bolus administration  of intravenous contrast. CONTRAST:  167mL ISOVUE-370 IOPAMIDOL (ISOVUE-370) INJECTION 76% COMPARISON:  CT chest abdomen pelvis dated 08/21/2018. FINDINGS: CT CHEST FINDINGS Cardiovascular: Heart size is normal. No pericardial effusion. No thoracic aortic aneurysm or evidence of aortic dissection. Mediastinum/Nodes: No mass or enlarged lymph nodes seen within the mediastinum, perihilar or axillary regions. Esophagus appears normal. Trachea and central  bronchi are unremarkable. Lungs/Pleura: New 1.2 cm nodule within the LEFT upper lobe (series 4, image 57). Stable 3 mm nodule within the RIGHT upper lobe (image 63). Lungs are otherwise clear. Musculoskeletal: No acute or suspicious osseous finding. CT ABDOMEN PELVIS FINDINGS Hepatobiliary: No focal mass or lesion is seen within the liver. Subtle low-density area within the RIGHT liver lobe is likely artifactual or related to geographic fatty infiltration. Gallbladder appears normal. No bile duct dilatation. Pancreas: Unremarkable. No pancreatic ductal dilatation or surrounding inflammatory changes. Spleen: Normal in size without focal abnormality. Adrenals/Urinary Tract: Adrenal glands appear normal. Kidneys are unremarkable without mass, stone or hydronephrosis. Bladder is decompressed. Stomach/Bowel: Surgical changes at the rectum, compatible with the given history of rectal resection. Associated chronic presacral thickening. No obstruction, inflammation or other complicating features at the surgical anastomosis. No dilated large or small bowel loops. RIGHT lower quadrant ostomy in place. Appendix is normal. Stomach is unremarkable, partially decompressed. Vascular/Lymphatic: No vascular abnormality. No enlarged lymph nodes seen in the abdomen or pelvis. Reproductive: Prostate is unremarkable. Other: No free fluid or abscess collection seen. No free intraperitoneal air. Musculoskeletal: No acute or suspicious osseous finding. IMPRESSION: 1. New 1.2 cm pulmonary nodule within the LEFT upper lobe (series 4, image 57), new compared to the chest CT of 08/21/2018, highly suspicious for neoplastic process (favor metastatic disease). Recommend further characterization with PET-CT and/or tissue sampling. 2. Surgical anastomosis at the rectum. No obstruction, inflammation or other complicating feature at the surgical anastomosis. 3. RIGHT lower quadrant ostomy in place. No bowel obstruction or inflammation. 4. Remainder  of the chest, abdomen and pelvis CT is unremarkable, as detailed above. No bowel obstruction. No evidence of additional metastasis within the abdomen or pelvis. These results will be called to the ordering clinician or representative by the Radiologist Assistant, and communication documented in the PACS or zVision Dashboard. Electronically Signed   By: Craig [name] M.D.   On: 09/18/2019 14:25    Nm Pet Image Initial (pi) Skull Base To Thigh  Result Date: 10/03/2019 CLINICAL DATA:  Subsequent treatment strategy for rectal carcinoma. Post rectal surgery. Diverting ileostomy EXAM: NUCLEAR MEDICINE PET SKULL BASE TO THIGH TECHNIQUE: 10.5 mCi F-18 FDG was injected intravenously. Full-ring PET imaging was performed from the skull base to thigh after the radiotracer. CT data was obtained and used for attenuation correction and anatomic localization. Fasting blood glucose: 100 mg/dl COMPARISON:  CT 09/18/2019 FINDINGS: Mediastinal blood pool activity: SUV max 2.5 Liver activity: SUV max NA NECK: No hypermetabolic lymph nodes in the neck. Incidental CT findings: none CHEST: Within the LEFT upper lobe 10 mm nodule (image 19) is not changed in size from 10 mm on 09/18/2019. This LEFT upper lobe nodule has intense metabolic activity with SUV max equal 6.0. No additional hypermetabolic pulmonary nodules in LEFT or RIGHT lung. No hypermetabolic mediastinal lymph nodes. Incidental CT findings: none ABDOMEN/PELVIS: No abnormal hypermetabolic activity within the liver, pancreas, adrenal glands, or spleen. No hypermetabolic lymph nodes in the abdomen or pelvis. No abnormal metabolic activity at the low rectal anastomosis. No hypermetabolic abdominopelvic lymph nodes. Incidental CT findings: Loop ileostomy without complication SKELETON:  No focal hypermetabolic activity to suggest skeletal metastasis. Incidental CT findings: none IMPRESSION: 1. Hypermetabolic nodule in the LEFT upper lobe consistent with solitary rectal  carcinoma metastasis. 2. No evidence of local recurrence at the low rectal anastomosis. 3. No evidence of metastatic disease in the abdomen pelvis. Electronically Signed   By: Craig [name] M.D.   On: 10/03/2019 16:07     I have independently reviewed the above radiology studies  and reviewed the findings with the patient.   Recent Lab Findings: Lab Results  Component Value Date   WBC 2.7 (L) 10/18/2019   HGB 15.1 10/18/2019   HCT 45.4 10/18/2019   PLT 226 10/18/2019   GLUCOSE 108 (H) 10/18/2019   ALT 109 (H) 10/18/2019   AST 93 (H) 10/18/2019   NA 139 10/18/2019   K 3.9 10/18/2019   CL 109 10/18/2019   CREATININE 1.38 (H) 10/18/2019   BUN 14 10/18/2019   CO2 19 (L) 10/18/2019   TSH 1.442 07/25/2012   INR 0.9 10/18/2019   HGBA1C 5.4 01/30/2018       Assessment / Plan:   #1 rectal cancer previously treated with surgical resection and chemotherapy.  Now patient appears to have a solitary nodule left upper lobe that is highly suspicious for metastatic rectal cancer.  I reviewed the scans with the patient and have recommended to him that we proceed with surgical resection of the left upper lobe lung nodule, as this appears to be the only site of disease if this is a lung metastasis, there is an outside chance it could be a primary lung cancer early stage.  Risks of surgery were discussed with the patient including bleeding, respiratory failure, cardiac valves, pulmonary emboli, prolonged air leak involvement discussed.  We also discussed the pros and cons of attempted biopsy now but the suspicious nature of the lesion would lead to resection even if he had a negative biopsy.  The goals risks and alternatives of the planned surgical procedure Procedure(s): VIDEO BRONCHOSCOPY (N/A) VIDEO ASSISTED THORACOSCOPY (VATS)/LUNG RESECTION (Left)  have been discussed with the patient in detail. The risks of the procedure including death, infection, stroke, myocardial infarction, bleeding, blood  transfusion have all been discussed specifically.  I have quoted Craig [name] a 2 % of perioperative mortality and a complication rate as high as 30%. The patient's questions have been answered.Craig [name] is willing  to proceed with the planned procedure.   Craig Isaac MD      [address] [place] [address] Office [phone]     10/22/2019 7:03 AM

## 2019-10-23 ENCOUNTER — Other Ambulatory Visit: Payer: Self-pay

## 2019-10-23 ENCOUNTER — Encounter (HOSPITAL_COMMUNITY): Payer: Self-pay | Admitting: Cardiothoracic Surgery

## 2019-10-23 ENCOUNTER — Inpatient Hospital Stay (HOSPITAL_COMMUNITY): Payer: 59

## 2019-10-23 LAB — CBC
HCT: 41.2 % (ref 39.0–52.0)
Hemoglobin: 14.1 g/dL (ref 13.0–17.0)
MCH: 31.6 pg (ref 26.0–34.0)
MCHC: 34.2 g/dL (ref 30.0–36.0)
MCV: 92.4 fL (ref 80.0–100.0)
Platelets: 211 10*3/uL (ref 150–400)
RBC: 4.46 MIL/uL (ref 4.22–5.81)
RDW: 12.7 % (ref 11.5–15.5)
WBC: 7.9 10*3/uL (ref 4.0–10.5)
nRBC: 0 % (ref 0.0–0.2)

## 2019-10-23 LAB — BASIC METABOLIC PANEL
Anion gap: 10 (ref 5–15)
BUN: 13 mg/dL (ref 6–20)
CO2: 23 mmol/L (ref 22–32)
Calcium: 8 mg/dL — ABNORMAL LOW (ref 8.9–10.3)
Chloride: 107 mmol/L (ref 98–111)
Creatinine, Ser: 1.33 mg/dL — ABNORMAL HIGH (ref 0.61–1.24)
GFR calc Af Amer: >60 mL/min (ref >60)
GFR calc non Af Amer: 59 mL/min — ABNORMAL LOW (ref >60)
Glucose, Bld: 146 mg/dL — ABNORMAL HIGH (ref 70–99)
Potassium: 4.7 mmol/L (ref 3.5–5.1)
Sodium: 140 mmol/L (ref 135–145)

## 2019-10-23 LAB — GLUCOSE, CAPILLARY
Glucose-Capillary: 116 mg/dL — ABNORMAL HIGH (ref 70–99)
Glucose-Capillary: 117 mg/dL — ABNORMAL HIGH (ref 70–99)
Glucose-Capillary: 129 mg/dL — ABNORMAL HIGH (ref 70–99)
Glucose-Capillary: 136 mg/dL — ABNORMAL HIGH (ref 70–99)
Glucose-Capillary: 97 mg/dL (ref 70–99)

## 2019-10-23 MED ORDER — ALTEPLASE 2 MG IJ SOLR
2.0000 mg | Freq: Once | INTRAMUSCULAR | Status: AC
  Administered 2019-10-23: 2 mg
  Filled 2019-10-23: qty 2

## 2019-10-23 MED ORDER — CHLORHEXIDINE GLUCONATE CLOTH 2 % EX PADS
6.0000 | MEDICATED_PAD | Freq: Every day | CUTANEOUS | Status: DC
  Administered 2019-10-24: 6 via TOPICAL

## 2019-10-23 NOTE — Plan of Care (Signed)
  Problem: Health Behavior/Discharge Planning: Goal: Ability to manage health-related needs will improve Outcome: Progressing   Problem: Activity: Goal: Risk for activity intolerance will decrease Outcome: Progressing   

## 2019-10-23 NOTE — Progress Notes (Addendum)
[address] [address]       [place] [address]             [phone]      1 Day Post-Op Procedure(s) (LRB): VIDEO BRONCHOSCOPY using anesthesia scope (N/A) VIDEO ASSISTED THORACOSCOPY (VATS) with mini thoracotomy/Left Upper Lobe Wedge RESECTION x2, left pleural biopsy, lymph node sampling, intercoastal nerve block (Left) Subjective: Feels pretty well. Minor pain, doesn't want to use PCA  Objective: Vital signs in last 24 hours: Temp:  [97.8 F (36.6 C)-99 F (37.2 C)] 98.8 F (37.1 C) (12/08 0737) Pulse Rate:  [67-100] 67 (12/08 0737) Cardiac Rhythm: Normal sinus rhythm (12/08 0748) Resp:  [16-25] 17 (12/08 0748) BP: (113-151)/(60-113) 134/99 (12/08 0737) SpO2:  [90 %-100 %] 99 % (12/08 0748) Arterial Line BP: (130-156)/(78-82) 156/82 (12/07 1340)  Hemodynamic parameters for last 24 hours:    Intake/Output from previous day: 12/07 0701 - 12/08 0700 In: 2626 [I.V.:2426; IV Piggyback:200] Out: 3450 [Urine:3100; Blood:100; Chest Tube:250] Intake/Output this shift: No intake/output data recorded.  General appearance: alert, cooperative and no distress Heart: regular rate and rhythm Lungs: clear to auscultation bilaterally Abdomen: benign Extremities: no edema or calf tenderness Wound: dressings intact  Lab Results: Recent Labs    10/23/19 0430  WBC 7.9  HGB 14.1  HCT 41.2  PLT 211   BMET:  Recent Labs    10/23/19 0430  NA 140  K 4.7  CL 107  CO2 23  GLUCOSE 146*  BUN 13  CREATININE 1.33*  CALCIUM 8.0*    PT/INR: No results for input(s): LABPROT, INR in the last 72 hours. ABG    Component Value Date/Time   PHART 7.411 10/18/2019 0959   HCO3 22.9 10/18/2019 0959   ACIDBASEDEF 1.1 10/18/2019 0959   O2SAT 97.1 10/18/2019 0959   CBG (last 3)  Recent Labs    10/22/19 1814 10/23/19 0002 10/23/19 0627  GLUCAP 151* 136* 117*    Meds Scheduled Meds: . acetaminophen  1,000 mg Oral Q6H   Or  . acetaminophen (TYLENOL) oral liquid  160 mg/5 mL  1,000 mg Oral Q6H  . bisacodyl  10 mg Oral Daily  . Chlorhexidine Gluconate Cloth  6 each Topical Daily  . enoxaparin (LOVENOX) injection  40 mg Subcutaneous Q24H  . fentaNYL   Intravenous Q4H  . insulin aspart  0-24 Units Subcutaneous Q6H  . pantoprazole  40 mg Oral Daily  . senna-docusate  1 tablet Oral QHS   Continuous Infusions: . 0.9 % NaCl with KCl 20 mEq / L 100 mL/hr at 10/23/19 0355   PRN Meds:.diphenhydrAMINE **OR** diphenhydrAMINE, diphenhydrAMINE, naloxone **AND** sodium chloride flush, ondansetron (ZOFRAN) IV, oxyCODONE, traMADol  Xrays Dg Chest 2 View  Result Date: 10/22/2019 CLINICAL DATA:  Preop. EXAM: CHEST - 2 VIEW COMPARISON:  PET CT 10/03/2019 FINDINGS: 1.2 cm left upper lobe pulmonary nodule.The cardiomediastinal contours are normal. Pulmonary vasculature is normal. No consolidation, pleural effusion, or pneumothorax. No acute osseous abnormalities are seen. IMPRESSION: The 1.2 cm left upper lobe pulmonary nodule is not significantly changed from prior PET-CT. No new abnormality. Electronically Signed   By: Keith Rake M.D.   On: 10/22/2019 06:47   Dg Chest Port 1 View  Result Date: 10/22/2019 CLINICAL DATA:  Chest tube. EXAM: PORTABLE CHEST 1 VIEW COMPARISON:  PET-CT 10/03/2019. CT chest 09/18/2019. Chest x-ray 10/22/2019. FINDINGS: Left subclavian line noted with tip over upper superior vena cava. Left chest tube noted with tip over the left upper chest.  No pneumothorax. Postsurgical changes left lung. Density noted in the left upper lung could be related to prior surgery. Pulmonary infiltrate cannot be excluded. No pleural effusion. Mild left chest wall subcutaneous emphysema. Heart size stable. No acute bony abnormality. IMPRESSION: 1. Left subclavian line noted with tip over upper superior vena cava. Left chest tube noted with tip over left upper chest. No pneumothorax. 2. Postsurgical changes left lung. Density noted the left upper lung could be  related to prior surgery. Pulmonary infiltrate cannot be excluded. Electronically Signed   By: Marcello Moores  Register   On: 10/22/2019 12:50    Assessment/Plan: S/P Procedure(s) (LRB): VIDEO BRONCHOSCOPY using anesthesia scope (N/A) VIDEO ASSISTED THORACOSCOPY (VATS) with mini thoracotomy/Left Upper Lobe Wedge RESECTION x2, left pleural biopsy, lymph node sampling, intercoastal nerve block (Left)  1 doing well 2 hemodyn stable in sinus rhythm 3 sats good on RA 4 CXR ok, CT- no air leak- H2O seal 5 doesn't want PCA- will d/c 6 creat stable, excellent UOP- d/c IVF 7 H/H stable 8 no leukocytosis BS adeq controlled  LOS: 1 day    John Giovanni PA-C 10/23/2019 Pager [phone]- not for patient use  Stable , ct to water seal  I have seen and examined [name] and agree with the above assessment  and plan.  Grace Isaac MD [facility] [phone] Office [phone] 10/23/2019 9:11 AM

## 2019-10-23 NOTE — Consult Note (Signed)
[facility] Nurse ostomy consult note Stoma type/location:  RUQ ileostomy. Contacted for provision of ostomy supplies. Stomal assessment/size: Not assessed Peristomal assessment: Did not see today Treatment options for stomal/peristomal skin: skin barrier ring Output Did not see today Ostomy pouching: 1pc.convex pouch with skin barrier ring Education provided: None Enrolled patient in [facility] program: Yes, previously.  Patient is connected to a supplier  Supplies Five (5) convex pouches, [employer] # [id number] and 5 skin barrier rings, Kellie Simmering # [phone] are ordered via the Unit Secretary. I have discussed with Bedside RN Katharine Look to let her know that pouches are to be delivered this morning so that she is expecting them. Guidance for ostomy care is provided for Nursing via the Orders.  [facility] nursing team will not follow, but will remain available to this patient, the nursing and medical teams.  Please re-consult if needed. Thanks, Maudie Flakes, MSN, RN, [facility], Ar[facility] Abbott  Pager# [phone]

## 2019-10-24 ENCOUNTER — Inpatient Hospital Stay (HOSPITAL_COMMUNITY): Payer: 59

## 2019-10-24 LAB — COMPREHENSIVE METABOLIC PANEL
ALT: 46 U/L — ABNORMAL HIGH (ref 0–44)
AST: 42 U/L — ABNORMAL HIGH (ref 15–41)
Albumin: 3 g/dL — ABNORMAL LOW (ref 3.5–5.0)
Alkaline Phosphatase: 102 U/L (ref 38–126)
Anion gap: 9 (ref 5–15)
BUN: 14 mg/dL (ref 6–20)
CO2: 24 mmol/L (ref 22–32)
Calcium: 8.7 mg/dL — ABNORMAL LOW (ref 8.9–10.3)
Chloride: 104 mmol/L (ref 98–111)
Creatinine, Ser: 1.28 mg/dL — ABNORMAL HIGH (ref 0.61–1.24)
GFR calc Af Amer: >60 mL/min (ref >60)
GFR calc non Af Amer: >60 mL/min (ref >60)
Glucose, Bld: 99 mg/dL (ref 70–99)
Potassium: 4.1 mmol/L (ref 3.5–5.1)
Sodium: 137 mmol/L (ref 135–145)
Total Bilirubin: 1.2 mg/dL (ref 0.3–1.2)
Total Protein: 6.6 g/dL (ref 6.5–8.1)

## 2019-10-24 LAB — GLUCOSE, CAPILLARY
Glucose-Capillary: 120 mg/dL — ABNORMAL HIGH (ref 70–99)
Glucose-Capillary: 123 mg/dL — ABNORMAL HIGH (ref 70–99)
Glucose-Capillary: 140 mg/dL — ABNORMAL HIGH (ref 70–99)
Glucose-Capillary: 116 mg/dL — ABNORMAL HIGH (ref 70–99)

## 2019-10-24 LAB — CBC
HCT: 43.8 % (ref 39.0–52.0)
Hemoglobin: 14.7 g/dL (ref 13.0–17.0)
MCH: 31.2 pg (ref 26.0–34.0)
MCHC: 33.6 g/dL (ref 30.0–36.0)
MCV: 93 fL (ref 80.0–100.0)
Platelets: 233 10*3/uL (ref 150–400)
RBC: 4.71 MIL/uL (ref 4.22–5.81)
RDW: 13 % (ref 11.5–15.5)
WBC: 6.7 10*3/uL (ref 4.0–10.5)
nRBC: 0 % (ref 0.0–0.2)

## 2019-10-24 LAB — SURGICAL PATHOLOGY

## 2019-10-24 NOTE — Discharge Instructions (Signed)
Thoracoscopy, Care After This sheet gives you information about how to care for yourself after your procedure. Your health care provider may also give you more specific instructions. If you have problems or questions, contact your health care provider. What can I expect after the procedure? After the procedure, it is common to have pain and soreness in the surgical area. Follow these instructions at home: Incision care   Follow instructions from your health care provider about how to take care of your incision. Make sure you: ? Wash your hands with soap and water before you change your bandage (dressing). If soap and water are not available, use hand sanitizer. ? Change your dressing as told by your health care provider. ? Leave stitches (sutures), skin glue, or adhesive strips in place. These skin closures may need to stay in place for 2 weeks or longer. If adhesive strip edges start to loosen and curl up, you may trim the loose edges. Do not remove adhesive strips completely unless your health care provider tells you to do that.  Check your incision areas every day for signs of infection. Check for: ? Redness, swelling, or pain. ? Fluid or blood. ? Warmth. ? Pus or a bad smell.  Do not take baths, swim, or use a hot tub until your health care provider approves. You may take showers. Medicines  Take over-the-counter and prescription medicines only as told by your health care provider.  If you were prescribed an antibiotic medicine, take it as told by your health care provider. Do not stop taking the antibiotic even if you start to feel better.  Do not drive or use heavy machinery while taking prescription pain medicine.  If you are taking prescription pain medicine, take actions to prevent or treat constipation. Your health care provider may recommend that you: ? Drink enough fluid to keep your urine pale yellow. ? Eat foods that are high in fiber, such as fresh fruits and vegetables,  whole grains, and beans. ? Limit foods that are high in fat and processed sugars, such as fried and sweet foods. ? Take an over-the-counter or prescription medicine for constipation. Managing pain, stiffness, and swelling   If directed, put ice on the affected area: ? Put ice in a plastic bag. ? Place a towel between your skin and the bag. ? Leave the ice on for 20 minutes, 2-3 times a day. Preventing lung infection  To prevent pneumonia and to keep your lungs healthy: ? Try to cough often. If it hurts to cough, hold a pillow against your chest as you cough. ? Take deep breaths or do breathing exercises as instructed by your health care provider. ? If you were given an incentive spirometer, use it as directed by your health care provider. General instructions  Do not lift anything that is heavier than 10 lb (4.5 kg), or the limit that you are told, until your health care provider says that it is safe.  Do not use any products that contain nicotine or tobacco, such as cigarettes and e-cigarettes. These can delay healing after surgery. If you need help quitting, ask your health care provider.  Avoid driving until your health care provider approves.  If you have a chest drainage tube, care for it as instructed by your health care provider. Do not travel by airplane after the chest drainage tube is removed until your health care provider approves.  Keep all follow-up visits as told by your health care provider. This is important. Contact  a health care provider if:  You have a fever.  Pain medicines do not ease your pain.  You have redness, swelling, or increasing pain in your incision area.  You develop a cough that does not go away, or you are coughing up mucus that is yellow or green. Get help right away if:  You have fluid, blood, or pus coming from your incision.  There is a bad smell coming from your incision or dressing.  You develop a rash.  You cough up blood.  You  develop light-headedness, or you feel faint.  You have difficulty breathing.  You develop chest pain.  Your heartbeat feels irregular or very fast. These symptoms may represent a serious problem that is an emergency. Do not wait to see if the symptoms will go away. Get medical help right away. Call your local emergency services (911 in the [place] Do not drive yourself to the hospital. Summary  Follow instructions from your health care provider about how to take care of your incision.  Do not drive or use heavy machinery while taking prescription pain medicine.  Leave stitches (sutures), skin glue, or adhesive strips in place.  Check your incision areas every day for signs of infection. This information is not intended to replace advice given to you by your health care provider. Make sure you discuss any questions you have with your health care provider. Document Released: 05/21/2005 Document Revised: 10/14/2017 Document Reviewed: 10/11/2017 Elsevier Patient Education  2020 [employer].

## 2019-10-24 NOTE — Progress Notes (Signed)
[address]       [place] [address]             [phone]      2 Days Post-Op Procedure(s) (LRB): VIDEO BRONCHOSCOPY using anesthesia scope (N/A) VIDEO ASSISTED THORACOSCOPY (VATS) with mini thoracotomy/Left Upper Lobe Wedge RESECTION x2, left pleural biopsy, lymph node sampling, intercoastal nerve block (Left) Subjective: Feels fine, minor sputum production  Objective: Vital signs in last 24 hours: Temp:  [98.4 F (36.9 C)-100.5 F (38.1 C)] 98.9 F (37.2 C) (12/09 0230) Pulse Rate:  [67-79] 77 (12/09 0230) Cardiac Rhythm: Normal sinus rhythm (12/09 0230) Resp:  [17-23] 18 (12/09 0640) BP: (121-134)/(83-99) 128/93 (12/09 0230) SpO2:  [94 %-99 %] 94 % (12/09 0230)  Hemodynamic parameters for last 24 hours:    Intake/Output from previous day: 12/08 0701 - 12/09 0700 In: 1000 [P.O.:1000] Out: 1435 [Urine:1125; Stool:150; Chest Tube:160] Intake/Output this shift: No intake/output data recorded.  General appearance: alert, cooperative and no distress Heart: regular rate and rhythm Lungs: mildly dim in bases Abdomen: benign, ostomy functioning well Extremities: no edema or calf tenderness Wound: incis healing well  Lab Results: Recent Labs    10/23/19 0430 10/24/19 0228  WBC 7.9 6.7  HGB 14.1 14.7  HCT 41.2 43.8  PLT 211 233   BMET:  Recent Labs    10/23/19 0430 10/24/19 0228  NA 140 137  K 4.7 4.1  CL 107 104  CO2 23 24  GLUCOSE 146* 99  BUN 13 14  CREATININE 1.33* 1.28*  CALCIUM 8.0* 8.7*    PT/INR: No results for input(s): LABPROT, INR in the last 72 hours. ABG    Component Value Date/Time   PHART 7.411 10/18/2019 0959   HCO3 22.9 10/18/2019 0959   ACIDBASEDEF 1.1 10/18/2019 0959   O2SAT 97.1 10/18/2019 0959   CBG (last 3)  Recent Labs    10/23/19 1619 10/23/19 2342 10/24/19 0635  GLUCAP 116* 97 120*    Meds Scheduled Meds: . acetaminophen  1,000 mg Oral Q6H   Or  . acetaminophen (TYLENOL) oral liquid  160 mg/5 mL  1,000 mg Oral Q6H  . bisacodyl  10 mg Oral Daily  . Chlorhexidine Gluconate Cloth  6 each Topical Daily  . enoxaparin (LOVENOX) injection  40 mg Subcutaneous Q24H  . insulin aspart  0-24 Units Subcutaneous Q6H  . pantoprazole  40 mg Oral Daily  . senna-docusate  1 tablet Oral QHS   Continuous Infusions: PRN Meds:.diphenhydrAMINE, oxyCODONE, traMADol  Xrays Dg Chest Port 1 View  Result Date: 10/23/2019 CLINICAL DATA:  Follow-up pneumothorax. EXAM: PORTABLE CHEST 1 VIEW COMPARISON:  October 22, 2019 FINDINGS: A left subclavian line is stable. A left chest tube is stable. No pneumothorax. A masslike opacity remains in the left upper lobe. No other interval changes. IMPRESSION: 1. No interval change in the appearance of the chest. No pneumothorax. 2. The masslike opacity in the left upper lobe is stable in the interval. Electronically Signed   By: Dorise Bullion III M.D   On: 10/23/2019 08:34   Dg Chest Port 1 View  Result Date: 10/22/2019 CLINICAL DATA:  Chest tube. EXAM: PORTABLE CHEST 1 VIEW COMPARISON:  PET-CT 10/03/2019. CT chest 09/18/2019. Chest x-ray 10/22/2019. FINDINGS: Left subclavian line noted with tip over upper superior vena cava. Left chest tube noted with tip over the left upper chest. No pneumothorax. Postsurgical changes left lung. Density noted in the left upper lung could be related to prior  surgery. Pulmonary infiltrate cannot be excluded. No pleural effusion. Mild left chest wall subcutaneous emphysema. Heart size stable. No acute bony abnormality. IMPRESSION: 1. Left subclavian line noted with tip over upper superior vena cava. Left chest tube noted with tip over left upper chest. No pneumothorax. 2. Postsurgical changes left lung. Density noted the left upper lung could be related to prior surgery. Pulmonary infiltrate cannot be excluded. Electronically Signed   By: Marcello Moores  Register   On: 10/22/2019 12:50    Assessment/Plan: S/P Procedure(s) (LRB): VIDEO  BRONCHOSCOPY using anesthesia scope (N/A) VIDEO ASSISTED THORACOSCOPY (VATS) with mini thoracotomy/Left Upper Lobe Wedge RESECTION x2, left pleural biopsy, lymph node sampling, intercoastal nerve block (Left)  1 doing very well 2 CXR is very stable in appearance 3 CT without air leak- will d/c tube 4 creat improved 5 H/H stable 6 no leukocytosis, tmax 100.5 7 routine pulm toilet and rehab 8 hopefully home in am  LOS: 2 days    John Giovanni PA-C 10/24/2019 Pager [phone]- not for patient loss

## 2019-10-24 NOTE — Op Note (Signed)
NAMEMICKEAL, DAWS MEDICAL RECORD YN:[id number] ACCOUNT [id number] DATE OF BIRTH:[birth year] FACILITY: [facility] LOCATION: [facility] PHYSICIAN:Wren Pryce Maryruth Bun, MD  OPERATIVE REPORT  DATE OF PROCEDURE:  10/22/2019  PREOPERATIVE DIAGNOSIS:  New left upper lobe lung lesion suggestive of rectal carcinoma metastasis.  POSTOPERATIVE DIAGNOSIS:  New left upper lobe lung lesion suggestive of rectal carcinoma metastasis.  SURGICAL PROCEDURE:  Bronchoscopy, left video-assisted thoracoscopy, wedge resection, left upper lobe x2, Lymph Node Sampling  mini left thoracotomy, pleural biopsy x1, and intercostal nerve blocks with Exparel.  SURGEON:  Lanelle Bal, MD  FIRST ASSISTANT:  Lars Pinks, PA  BRIEF HISTORY:  The patient is a 58 year old male who had previously been diagnosed with rectal cancer, currently has an ileostomy in place and was being considered for takedown of his ileostomy.  However, on screening CT scans a 1 cm left upper lobe  lung nodule was noted on recent scan that we had not been present the year previously.  PET scan showed it to have mild metabolic activity.  There was no evidence of other metastatic disease on PET scan.  With what appeared to be an isolated pulmonary  metastasis the patient had been referred for consideration of surgical resection.  Risks and options were discussed with the patient who was agreeable with proceeding with resection of the what appeared to be an isolating metastasis, both for therapeutic  and diagnostic reasons.  DESCRIPTION OF PROCEDURE:  The patient underwent general endotracheal anesthesia with a double-lumen endotracheal tube without difficulty.  Appropriate timeout was performed and we proceeded with video bronchoscopy down the endotracheal tube confirming  its proper location through the tube.  No endobronchial lesions were appreciated.  The scope was removed.  The patient was turned in lateral decubitus position with the left side up.   The left side had preoperatively been marked.  The left chest was  prepped with Betadine, draped in a sterile manner.  Initially, started approximately at the 8th intercostal space with a 5 mm port incision and placed a 30-degree 5 mm scope into the chest.  Mild insufflation was used with adequate collapse of the lung.   We then moved around the space examining with the scope and a single nodular density on the posterior pleura was noted and was somewhat suspicious.  A 12 mm port was placed superior and more anterior and through this, the pleural biopsy was obtained and  a specimen submitted for frozen section.  Pathology noted that this appeared atypical, but could make no definitive diagnosis on the frozen section.  We then proceeded with our localization of the left upper lobe lesion which appeared to be in the  anterior segment.  A 2nd port incision 12 mm was placed more posteriorly to assist in location and stapling.  A small utilitarian incision was made in approximately at the 3rd intercostal space.  Through this and manipulating the lung through the ports,  we were able to palpate the lesion.  Initial wedge resection of the area after examination of the table, did not include the specimen.  With this, we enlarged our thoracotomy incision slightly and were better able to isolate and secure the lesion with a  lung clamp.  Using 40 mm purple load Covidien staplers with reinforcement, a 2nd wedge resection was done.  This one was removed and the lesion palpated.  The posterior margin and deep margins were labeled and the specimen was submitted to pathology for  frozen section.  Pathology confirmed a lesion was in the  specimen with clear deep margin.  There was tumor present at the margin opposite area and labeled deep margin, the staple line associated with the previous wedge resection.  With this orientation  of the wedge resection we decided that we had free margins.  We did sample  lymph nodes 10L and  submitted these for pathologic examination.  Using 50 mL of saline, 30 mL bupivacaine, 20 mL as Exparel combination a large spinal needle was used to  inject approximately 10 mL of solution in each intercostal space from 3 to 8.  A single 28 Blake chest tube was left in place.  The utilitarian incision and we had made was closed with interrupted 0 Vicryl, running 3-0 Vicryl for subcutaneous tissue, and  3-0 subcuticular in the skin edges.  The remaining 2 port sites were then closed with UR-6 in the deep layer and a running 3-0 Vicryl.  The lung reinflated nicely.  There was no air leak.  Estimated blood loss was less than 100 mL.  The patient was  awakened and extubated in the operating room.  Sponge and needle count were reported as correct.  The patient was transferred to the recovery room having tolerated the procedure without obvious complication.  CN/NUANCE  D:10/23/2019 T:10/24/2019 JOB:[id number]

## 2019-10-24 NOTE — Progress Notes (Signed)
Pt ambulated 470 feet in hallway with front wheel walker. Pt tolerated well.   Arletta Bale, RN

## 2019-10-24 NOTE — Discharge Summary (Signed)
Physician Discharge Summary  Patient ID: REMIJIO [name] MRN: [id number] DOB/AGE: 1961-09-14 58 y.o.  Admit date: 10/22/2019 Discharge date: 10/30/2019  Admission Diagnoses:Left lung nodules  Discharge Diagnoses:  Active Problems:   Nodule of left lung   Patient Active Problem List   Diagnosis Date Noted  . Nodule of left lung 10/22/2019  . History of rectal cancer 10/05/2018  . Malnutrition of moderate degree 02/10/2018  . Family history of cancer   . Rectal cancer ([place]) 09/30/2017  . Rectal bleeding 01/07/2017  . Rectal mass 01/07/2017  . History of diagnostic tests 08/25/2012  . Palpitations   . Anxiety    History of Present Illness:   at time of admission Craig [name] 58 y.o. male has been  seen in the office for for a new left upper lobe lung nodule 1.2 cm in size hypermetabolic.  Mass was not present on a CT scan of the chest 1 year ago.  The patient has been treated for known rectal cancer first diagnosed in November 2018.  Subsequent follow-up CT scans showed a new lung lesion and the patient was referred for consideration of resection, of which could be a solitary lung metastasis versus primary lung cancer.  The patient is a very distant smoker but quit at the age of 71.  He currently works full-time as a Administrator.  He denies any shortness of breath wheezing or other respiratory difficulty.  He has had no previous cardiac history.  Discharged Condition: good  Hospital Course: Patient was admitted electively and on 10/22/2019 taken to the operating room at which time he underwent the below described procedure.  He tolerated it well and was taken to the postanesthesia care unit in stable condition  Postoperative hospital course:  The patient has done well postoperatively.  He has maintained stable hemodynamics and normal sinus rhythm.  Oxygen was weaned without difficulty and he maintains good saturations on room air.  His chest tube was monitored in a routine manner with  serial chest x-rays and was stable for removal on postoperative day #2.  His pathology revealed metastatic adenocarcinoma.  Please see report for full details.  The patient did not have any postoperative anemia.  His renal function remained stable.  Most recent BUN and creatinine dated 10/24/2019 was 14 and 1.28 respectively.  Incisions were noted to be healing well.  There was no evidence of infection.  He was tolerating gradually increasing activities using standard protocols without difficulty.  At the time of discharge the patient was felt to be quite stable.  Consults: None  Pathology:  SURGICAL PATHOLOGY SURGICAL PATHOLOGY  CASE: [id number]  PATIENT: Craig [name]  Surgical Pathology Report      Clinical History: left upper lobe lung lesion (cm)     FINAL MICROSCOPIC DIAGNOSIS:   A. PLEURA, LEFT, BIOPSY:  - No carcinoma identified   B. LUNG, LEFT UPPER LOBE, WEDGE RESECTION:  - No carcinoma identified   C. LUNG, LEFT UPPER LOBE, WEDGE RESECTION:  - Metastatic adenocarcinoma  - No carcinoma identified in one lymph node (0/1)  - Margins uninvolved by carcinoma  - See comment   D. LYMPH NODE, 10L, BIOPSY:  - No carcinoma identified in one lymph node (0/1)   COMMENT:   C. By immunohistochemistry the neoplastic cells are positive for  cytokeratin 20 and CDX2 but negative for cytokeratin 7 and TTF-1. The  overall morphology and immunophenotype are consistent with metastasis  from the patient's known gastrointestinal primary. Dr. Jeannie Done reviewed  the case and agrees with the above diagnosis.    INTRAOPERATIVE DIAGNOSIS:   A. LEFT PLEURAL BIOPSY: FOCAL ATYPICAL CELLS WITH ASSOCIATED  INFLAMMATION. PER DR. Melina Copa. 10/22/2019.  B. LEFT UPPER LOBE LUNG WEDGE RESECTION, GROSS OR CONSULT: NO NODULE  IDENTIFIED GROSSLY. CALLED TO OR 14/DR. GERHARDT AT 10 AM PER DR.  CANACCI.  C. LEFT UPPER LOBE LUNG WEDGE RESECTION #2, MASS: CONSISTENT WITH  METASTATIC  ADENOCARCINOMA. CALL TO OR 14/DR. GERHARDT AT 11 AM. PER  DR. CANACCI.   GROSS DESCRIPTION:   A. Received in saline is a 0.4 cm tan-pink soft tissue fragment,  entirely frozen and subsequent submitted in block A1.   B. Received fresh for gross OR consult is a 26 g, 7.8 x 3.5 x 3 cm  wedge of lung parenchyma with a glistening pleural surface and cut  surface that reveals spongy tan-pink cut surfaces. A discrete mass is  not seen. A single 0.3 cm area of firm brown tissue is identified, 2.5  cm to be closest stapled margin. Representative sections are as  follows:  Block summary:  1 = firm area  2 = additional representative section   C. Received fresh for intraoperative consult is a 5 x 2.5 x 2 cm, 9 g  wedge of tan-pink parenchyma, received with the posterior and deep  stapled margins oriented. Specimen displays a glistening intact pleural  surface and cut surface that reveals a 1.2 x 1.2 x 1 cm firm tan mass.  The mass is at the superficial staple line margin, is 1 cm to the deep  stapled margin, greater than 1 cm to the posterior staple line margin.  Representative section is submitted for frozen sections. Additional  representative sections are as follows:  Block summary:  1 = mass to superficial stapled margin, margin inked black  2-3 = mass   D. Received in saline is a 0.6 cm anthracotic lymph node fragment,  entirely submitted in block D1. (AK 10/22/2019)     Final Diagnosis performed by Thressa Sheller, MD.  Electronically signed  10/24/2019  Technical and / or Professional components performed at [facility]. [facility], [place] [address] [place], [place] [address].  Immunohistochemistry Technical component (if applicable) was performed  at [facility]. [address] [place],  [place], [place] [address].  IMMUNOHISTOCHEMISTRY DISCLAIMER (if applicable):  Some of these immunohistochemical stains may have been developed and the   performance characteristics determine by [facility]. Some  may not have been cleared or approved by the U.S. Food and Drug  Administration. The FDA has determined that such clearance or approval  is not necessary. This test is used for clinical purposes. It should not  be regarded as investigational or for research. This laboratory is  certified under the [place]  (CLIA-88) as qualified to perform high complexity clinical laboratory  testing. The controls stained appropriately.      Significant Diagnostic Studies: routine post op labs and serial chest Xray's  Treatments: surgery:  OPERATIVE REPORT  DATE OF PROCEDURE:  10/22/2019  PREOPERATIVE DIAGNOSIS:  New left upper lobe lung lesion suggestive of rectal carcinoma metastasis.  POSTOPERATIVE DIAGNOSIS:  New left upper lobe lung lesion suggestive of rectal carcinoma metastasis.  SURGICAL PROCEDURE:  Bronchoscopy, left video-assisted thoracoscopy, wedge resection, left upper lobe x2, Lymph Node Sampling  mini left thoracotomy, pleural biopsy x1, and intercostal nerve blocks with Exparel.  SURGEON:  Lanelle Bal, MD  FIRST ASSISTANT:  Lars Pinks, PA Discharge Exam: Blood pressure 123/86, pulse 62, temperature 98.1 F (36.7 C), temperature source Oral, resp. rate 18, height 5\' 9"  (1.753 m), weight 97.5 kg, SpO2 91 %.  General appearance: alert, cooperative and no distress Heart: regular rate and rhythm Lungs: clear to auscultation bilaterally Abdomen: benign Extremities: no edema or calf tenderness Wound: incis healing well  Disposition: Discharge disposition: 01-Home or Self Care       Discharge Instructions    Discharge patient   Complete by: As directed    Discharge disposition: 01-Home or Self Care   Discharge patient date: 10/25/2019     Allergies as of 10/25/2019   No Known Allergies     Medication List    TAKE these medications    acetaminophen 500 MG tablet Commonly known as: TYLENOL Take 1,000 mg by mouth every 6 (six) hours as needed for moderate pain or headache.   aspirin-sod bicarb-citric acid 325 MG Tbef tablet Commonly known as: ALKA-SELTZER Take 650 mg by mouth every 6 (six) hours as needed (heartburn).   diphenhydrAMINE 25 MG tablet Commonly known as: SOMINEX Take 50 mg by mouth at bedtime as needed for sleep.   naphazoline-glycerin 0.012-0.2 % Soln Commonly known as: CLEAR EYES REDNESS Place 1-2 drops into both eyes 4 (four) times daily as needed for eye irritation.   naproxen sodium 220 MG tablet Commonly known as: ALEVE Take 440 mg by mouth daily as needed (pain).   oxymetazoline 0.05 % nasal spray Commonly known as: AFRIN Place 1 spray into both nostrils 2 (two) times daily as needed for congestion.   traMADol 50 MG tablet Commonly known as: ULTRAM Take 1 tablet (50 mg total) by mouth every 6 (six) hours as needed for up to 7 days (mild pain).      Follow-up Information    Grace Isaac, MD Follow up.   Specialty: Cardiothoracic Surgery Why: Please see discharge paperwork for follow-up appointments with Dr. Servando Snare and with the nurse for suture removal.  On the date you see Dr. Servando Snare obtain a chest x-ray from [facility] imaging 1/2-hour prior to this appointment.  It is in same Medical laboratory scientific officer information: [place] [id number] [phone]           Signed: John Giovanni PA-C 10/30/2019, 1:15 PM

## 2019-10-24 NOTE — Progress Notes (Signed)
Chest tube removed per protocol. Patient informed of bedrest for 1 hour. All questions answered. Will continue to monitor.   Arletta Bale, RN

## 2019-10-24 NOTE — Progress Notes (Signed)
Pt ambulated 470 feet in hallway. Pt tolerated well.  Arletta Bale, RN

## 2019-10-25 ENCOUNTER — Inpatient Hospital Stay (HOSPITAL_COMMUNITY): Payer: 59

## 2019-10-25 ENCOUNTER — Inpatient Hospital Stay: Admit: 2019-10-25 | Payer: 59 | Admitting: Surgery

## 2019-10-25 LAB — GLUCOSE, CAPILLARY: Glucose-Capillary: 129 mg/dL — ABNORMAL HIGH (ref 70–99)

## 2019-10-25 SURGERY — CLOSURE, ILEOSTOMY
Anesthesia: General

## 2019-10-25 MED ORDER — TRAMADOL HCL 50 MG PO TABS: 50.0000 mg | ORAL_TABLET | Freq: Four times a day (QID) | ORAL | 0 refills | Status: AC | PRN

## 2019-10-25 NOTE — Progress Notes (Addendum)
[address]       [place] [address]             [phone]      3 Days Post-Op Procedure(s) (LRB): VIDEO BRONCHOSCOPY using anesthesia scope (N/A) VIDEO ASSISTED THORACOSCOPY (VATS) with mini thoracotomy/Left Upper Lobe Wedge RESECTION x2, left pleural biopsy, lymph node sampling, intercoastal nerve block (Left) Subjective: Feels well, minor sputum production  Objective: Vital signs in last 24 hours: Temp:  [98 F (36.7 C)-98.8 F (37.1 C)] 98.5 F (36.9 C) (12/10 0419) Pulse Rate:  [3-80] 69 (12/10 0419) Cardiac Rhythm: Normal sinus rhythm (12/09 1900) Resp:  [14-23] 14 (12/10 0419) BP: (120-136)/(76-88) 120/83 (12/10 0419) SpO2:  [95 %-97 %] 95 % (12/10 0419)  Hemodynamic parameters for last 24 hours:    Intake/Output from previous day: 12/09 0701 - 12/10 0700 In: -  Out: 400 [Urine:325; Chest Tube:75] Intake/Output this shift: No intake/output data recorded.  General appearance: alert, cooperative and no distress Heart: regular rate and rhythm Lungs: clear to auscultation bilaterally Abdomen: benign Extremities: no edema or calf tenderness Wound: incis healing well  Lab Results: Recent Labs    10/23/19 0430 10/24/19 0228  WBC 7.9 6.7  HGB 14.1 14.7  HCT 41.2 43.8  PLT 211 233   BMET:  Recent Labs    10/23/19 0430 10/24/19 0228  NA 140 137  K 4.7 4.1  CL 107 104  CO2 23 24  GLUCOSE 146* 99  BUN 13 14  CREATININE 1.33* 1.28*  CALCIUM 8.0* 8.7*    PT/INR: No results for input(s): LABPROT, INR in the last 72 hours. ABG    Component Value Date/Time   PHART 7.411 10/18/2019 0959   HCO3 22.9 10/18/2019 0959   ACIDBASEDEF 1.1 10/18/2019 0959   O2SAT 97.1 10/18/2019 0959   CBG (last 3)  Recent Labs    10/24/19 1747 10/24/19 2308 10/25/19 0629  GLUCAP 140* 116* 129*    Meds Scheduled Meds: . acetaminophen  1,000 mg Oral Q6H   Or  . acetaminophen (TYLENOL) oral liquid 160 mg/5 mL  1,000 mg Oral Q6H  .  bisacodyl  10 mg Oral Daily  . Chlorhexidine Gluconate Cloth  6 each Topical Daily  . enoxaparin (LOVENOX) injection  40 mg Subcutaneous Q24H  . insulin aspart  0-24 Units Subcutaneous Q6H  . pantoprazole  40 mg Oral Daily  . senna-docusate  1 tablet Oral QHS   Continuous Infusions: PRN Meds:.diphenhydrAMINE, oxyCODONE, traMADol  Xrays DG CHEST PORT 1 VIEW  Result Date: 10/24/2019 CLINICAL DATA:  Cardiorespiratory status postsurgery. Post VATS with left upper lobe right wedge resection. EXAM: PORTABLE CHEST 1 VIEW COMPARISON:  10/23/2011 FINDINGS: Left subclavian central venous catheterization unchanged. Left-sided chest tube unchanged. Lungs are hypoinflated with improving opacification over the left upper lung. Surgical suture line over the left upper lung compatible with recent wedge resection. No pneumothorax. Mild stable cardiomegaly. Remainder of the exam is unchanged. IMPRESSION: 1. Hypoinflation with improving opacification over the left upper lung. 2.  Tubes and lines as described.  No pneumothorax. Electronically Signed   By: Marin Olp M.D.   On: 10/24/2019 08:08    Assessment/Plan: S/P Procedure(s) (LRB): VIDEO BRONCHOSCOPY using anesthesia scope (N/A) VIDEO ASSISTED THORACOSCOPY (VATS) with mini thoracotomy/Left Upper Lobe Wedge RESECTION x2, left pleural biopsy, lymph node sampling, intercoastal nerve block (Left)  1 doing very well, stable for discharge 2 hemodyn stable in sinus rhythm 3 sats good on RA 4 no new  labs 5 CXR very stable in appearance  LOS: 3 days    Craig Giovanni PA-C 10/25/2019 Pager [phone]- not for patient use  Patient seen, feels well with chest tube out, chest xray thia am reviewed , plan removal of central line and d/c home today  I have seen and examined Craig [name] and agree with the above assessment  and plan.  Grace Isaac MD Beeper [phone] Office [phone] 10/25/2019 8:28 AM

## 2019-10-25 NOTE — Progress Notes (Signed)
Blaire RN aware of order to d/c CVC.

## 2019-10-25 NOTE — Progress Notes (Signed)
Discharge instructions given to patient. IV and CVC removed per order. Wound care and medications reviewed. All questions answered. Patient escorted home by girlfriend.  Arletta Bale, RN

## 2019-10-29 ENCOUNTER — Other Ambulatory Visit: Payer: Self-pay

## 2019-10-29 ENCOUNTER — Ambulatory Visit (INDEPENDENT_AMBULATORY_CARE_PROVIDER_SITE_OTHER): Payer: Self-pay

## 2019-10-29 VITALS — Temp 97.5°F

## 2019-10-29 DIAGNOSIS — Z736 Limitation of activities due to disability: Secondary | ICD-10-CM

## 2019-10-29 DIAGNOSIS — R911 Solitary pulmonary nodule: Secondary | ICD-10-CM

## 2019-10-29 DIAGNOSIS — Z4802 Encounter for removal of sutures: Secondary | ICD-10-CM

## 2019-10-29 NOTE — Progress Notes (Signed)
Pt here today for suture removal s/p L VATS on 12/7. He is a/o and without c/o. Two sutures to L upper/lateral back are in place. Incision is well approximated and w/o s/s of infection. Sutures x 2 removed using standard procedure. Pt tolerates well. Instructed to keep incision clean and dry, using only mild soap and water. Pt for f/u w/ MD next Monday.

## 2019-10-31 ENCOUNTER — Other Ambulatory Visit: Payer: Self-pay | Admitting: Cardiothoracic Surgery

## 2019-10-31 DIAGNOSIS — R911 Solitary pulmonary nodule: Secondary | ICD-10-CM

## 2019-11-07 ENCOUNTER — Other Ambulatory Visit: Payer: Self-pay

## 2019-11-07 ENCOUNTER — Ambulatory Visit
Admission: RE | Admit: 2019-11-07 | Discharge: 2019-11-07 | Disposition: A | Discharge status: Home / Self Care | Payer: 59 | Source: Ambulatory Visit | Attending: Cardiothoracic Surgery | Admitting: Cardiothoracic Surgery

## 2019-11-07 ENCOUNTER — Ambulatory Visit (INDEPENDENT_AMBULATORY_CARE_PROVIDER_SITE_OTHER): Payer: Self-pay | Admitting: Cardiothoracic Surgery

## 2019-11-07 VITALS — BP 144/95 | HR 94 | Temp 97.5°F | Resp 20 | Ht 69.0 in | Wt 210.0 lb

## 2019-11-07 DIAGNOSIS — Z09 Encounter for follow-up examination after completed treatment for conditions other than malignant neoplasm: Secondary | ICD-10-CM

## 2019-11-07 DIAGNOSIS — R911 Solitary pulmonary nodule: Secondary | ICD-10-CM

## 2019-11-07 DIAGNOSIS — C189 Malignant neoplasm of colon, unspecified: Secondary | ICD-10-CM

## 2019-11-07 DIAGNOSIS — C7800 Secondary malignant neoplasm of unspecified lung: Secondary | ICD-10-CM

## 2019-11-07 NOTE — Progress Notes (Signed)
[address]       [place] [address]             [phone]      Tregan D Allers Vayas Medical Record #[id number] Date of Birth: 1961-08-14  Referring: Ladell Pier, MD Primary Care: Patient, No Pcp Per Primary Cardiologist: No primary care provider on file.   Chief Complaint:   POST OP FOLLOW UP OPERATIVE REPORT DATE OF PROCEDURE:  10/22/2019 PREOPERATIVE DIAGNOSIS:  New left upper lobe lung lesion suggestive of rectal carcinoma metastasis. POSTOPERATIVE DIAGNOSIS:  New left upper lobe lung lesion suggestive of rectal carcinoma metastasis. SURGICAL PROCEDURE:  Bronchoscopy, left video-assisted thoracoscopy, wedge resection, left upper lobe x2, Lymph Node Sampling  mini left thoracotomy, pleural biopsy x1, and intercostal nerve blocks with Exparel.  History of Present Illness:     Patient returns office today for follow-up visit after recent wedge resection of left upper lobe lung nodule-found to be consistent with metastatic colon cancer.  Pleural biopsy of was also done without evidence of pleural metastasis.  The patient is making a very quick recovery.  Notes he is able to move his arms and shoulders chest without a lot of soreness, no longer needing pain medication.   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       Past Medical History:  Diagnosis Date  . Anxiety    Truck driver  . Dysrhythmia    palpitations  . Elevated serum creatinine    1.39 in 06/2012  . Family history of cancer   . GERD (gastroesophageal reflux disease)   . History of kidney stones   . Ileostomy in place [facility])   . Palpitations    + chest pressure; PVCs  . Rectal bleeding 01/07/2017  . Rectal cancer ([facility]) 09/30/2017     Social History   Tobacco Use  Smoking Status Former Smoker  Smokeless Tobacco Never Used  Tobacco Comment   quit at age 55    Social History   Substance and  Sexual Activity  Alcohol Use Yes   Comment: occ- none since 08/2017     No Known Allergies  Current Outpatient Medications  Medication Sig Dispense Refill  . acetaminophen (TYLENOL) 500 MG tablet Take 1,000 mg by mouth every 6 (six) hours as needed for moderate pain or headache.    Marland Kitchen aspirin-sod bicarb-citric acid (ALKA-SELTZER) 325 MG TBEF tablet Take 650 mg by mouth every 6 (six) hours as needed (heartburn).    . diphenhydrAMINE (SOMINEX) 25 MG tablet Take 50 mg by mouth at bedtime as needed for sleep.     . naphazoline-glycerin (CLEAR EYES REDNESS) 0.012-0.2 % SOLN Place 1-2 drops into both eyes 4 (four) times daily as needed for eye irritation.     . naproxen sodium (ALEVE) 220 MG tablet Take 440 mg by mouth daily as needed (pain).    Marland Kitchen oxymetazoline (AFRIN) 0.05 % nasal spray Place 1 spray into both nostrils 2 (two) times daily as needed for congestion.     No current facility-administered medications for this visit.       Physical Exam: BP (!) 144/95 (BP Location: Right Arm, Patient Position: Sitting, Cuff Size: Normal)   Pulse 94   Temp (!) 97.5 F (36.4 C) (Skin)   Resp 20   Ht 5\' 9"  (1.753 m)   Wt 95.3 kg   SpO2 96% Comment: RA  BMI  31.01 kg/m   General appearance: alert, cooperative, appears stated age and no distress Neurologic: intact Heart: regular rate and rhythm, S1, S2 normal, no murmur, click, rub or gallop Lungs: clear to auscultation bilaterally Abdomen: soft, non-tender; bowel sounds normal; no masses,  no organomegaly Extremities: extremities normal, atraumatic, no cyanosis or edema Wound: Patient's incisions and port sites are healing well without evidence of infection   Diagnostic Studies & Laboratory data:     Recent Radiology Findings:   DG Chest 2 View  Result Date: 11/07/2019 CLINICAL DATA:  Rectal cancer. Left upper lobe pulmonary nodule status post left upper lobe which resection 10/22/2019. EXAM: CHEST - 2 VIEW COMPARISON:  10/25/2019  chest radiograph. FINDINGS: Stable cardiomediastinal silhouette with normal heart size. No pneumothorax. No right pleural effusion. Stable mild blunting of the left costophrenic angle. No pulmonary edema. Suture lines overlie the apical left lung. Mild volume loss in the left hemithorax, unchanged. Mild linear scarring versus atelectasis at the left lung base. Decreased hazy opacity in the medial apical left lung. No acute consolidative airspace disease. Surgical clip overlies the left axilla. IMPRESSION: 1. No pneumothorax. 2. Decreased hazy opacity in the medial apical left lung, favor evolving postsurgical change. Continued chest imaging follow-up advised. 3. Stable mild blunting of the left costophrenic angle, favor mild pleural-parenchymal scarring. 4. Stable mild linear scarring versus atelectasis at the left lung base. Electronically Signed   By: Ilona Sorrel M.D.   On: 11/07/2019 15:19   Stable postop chest x-ray I have independently reviewed the above radiology studies  and reviewed the findings with the patient.    Recent Lab Findings: Lab Results  Component Value Date   WBC 6.7 10/24/2019   HGB 14.7 10/24/2019   HCT 43.8 10/24/2019   PLT 233 10/24/2019   GLUCOSE 99 10/24/2019   ALT 46 (H) 10/24/2019   AST 42 (H) 10/24/2019   NA 137 10/24/2019   K 4.1 10/24/2019   CL 104 10/24/2019   CREATININE 1.28 (H) 10/24/2019   BUN 14 10/24/2019   CO2 24 10/24/2019   TSH 1.442 07/25/2012   INR 0.9 10/18/2019   HGBA1C 5.4 01/30/2018   Clinical History: left upper lobe lung lesion (cm)   FINAL MICROSCOPIC DIAGNOSIS:   A. PLEURA, LEFT, BIOPSY:  - No carcinoma identified   B. LUNG, LEFT UPPER LOBE, WEDGE RESECTION:  - No carcinoma identified   C. LUNG, LEFT UPPER LOBE, WEDGE RESECTION:  - Metastatic adenocarcinoma  - No carcinoma identified in one lymph node (0/1)  - Margins uninvolved by carcinoma  - See comment   D. LYMPH NODE, 10L, BIOPSY:  - No carcinoma identified in  one lymph node (0/1)   COMMENT:   C. By immunohistochemistry the neoplastic cells are positive for  cytokeratin 20 and CDX2 but negative for cytokeratin 7 and TTF-1. The  overall morphology and immunophenotype are consistent with metastasis  from the patient's known gastrointestinal primary. Dr. Jeannie Done reviewed  the case and agrees with the above diagnosis.    INTRAOPERATIVE DIAGNOSIS:   A. LEFT PLEURAL BIOPSY: FOCAL ATYPICAL CELLS WITH ASSOCIATED  INFLAMMATION. PER DR. Melina Copa. 10/22/2019.  B. LEFT UPPER LOBE LUNG WEDGE RESECTION, GROSS OR CONSULT: NO NODULE  IDENTIFIED GROSSLY. CALLED TO OR 14/DR. Gimena Buick AT 10 AM PER DR.  CANACCI.  C. LEFT UPPER LOBE LUNG WEDGE RESECTION #2, MASS: CONSISTENT WITH  METASTATIC ADENOCARCINOMA. CALL TO OR 14/DR. Garet Hooton AT 11 AM. PER  DR. CANACCI.   GROSS DESCRIPTION:   A. Received  in saline is a 0.4 cm tan-pink soft tissue fragment,  entirely frozen and subsequent submitted in block A1.   B. Received fresh for gross OR consult is a 26 g, 7.8 x 3.5 x 3 cm  wedge of lung parenchyma with a glistening pleural surface and cut  surface that reveals spongy tan-pink cut surfaces. A discrete mass is  not seen. A single 0.3 cm area of firm brown tissue is identified, 2.5  cm to be closest stapled margin. Representative sections are as  follows:  Block summary:  1 = firm area  2 = additional representative section   C. Received fresh for intraoperative consult is a 5 x 2.5 x 2 cm, 9 g  wedge of tan-pink parenchyma, received with the posterior and deep  stapled margins oriented. Specimen displays a glistening intact pleural  surface and cut surface that reveals a 1.2 x 1.2 x 1 cm firm tan mass.  The mass is at the superficial staple line margin, is 1 cm to the deep  stapled margin, greater than 1 cm to the posterior staple line margin.  Representative section is submitted for frozen sections. Additional  representative sections are  as follows:  Block summary:  1 = mass to superficial stapled margin, margin inked black  2-3 = mass   D. Received in saline is a 0.6 cm anthracotic lymph node fragment,  entirely submitted in block D1. (AK 10/22/2019)     Final Diagnosis performed by Thressa Sheller, MD.  Electronically signed  10/24/2019    Assessment / Plan:   Stable after recent wedge resection left upper lobe for 1.2 cm lung mass found to be consistent with adenocarcinoma of colon origin. Patient is making good postoperative progress  We will continue to increase his physical activities, we discussed returning to work January 18.  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  He is no longer needing pain medication  Patient to see Dr. Benay Spice next week for follow-up oncology appointment  We will plan to see him back in 6 weeks.   Medication Changes: No orders of the defined types were placed in this encounter.     Grace Isaac MD      [address] [place] [address] Office [phone]     11/07/2019 3:57 PM

## 2019-11-12 ENCOUNTER — Telehealth: Payer: Self-pay | Admitting: Oncology

## 2019-11-12 ENCOUNTER — Other Ambulatory Visit: Payer: Self-pay

## 2019-11-12 ENCOUNTER — Inpatient Hospital Stay: Payer: 59 | Attending: Nurse Practitioner | Admitting: Nurse Practitioner

## 2019-11-12 ENCOUNTER — Encounter: Payer: Self-pay | Admitting: Nurse Practitioner

## 2019-11-12 VITALS — BP 128/90 | HR 81 | Temp 98.0°F | Resp 17 | Ht 69.0 in | Wt 210.7 lb

## 2019-11-12 DIAGNOSIS — R911 Solitary pulmonary nodule: Secondary | ICD-10-CM | POA: Diagnosis not present

## 2019-11-12 DIAGNOSIS — R970 Elevated carcinoembryonic antigen [CEA]: Secondary | ICD-10-CM | POA: Diagnosis not present

## 2019-11-12 DIAGNOSIS — C20 Malignant neoplasm of rectum: Secondary | ICD-10-CM | POA: Diagnosis present

## 2019-11-12 DIAGNOSIS — Z79899 Other long term (current) drug therapy: Secondary | ICD-10-CM | POA: Insufficient documentation

## 2019-11-12 NOTE — Progress Notes (Addendum)
[facility] OFFICE PROGRESS NOTE   Diagnosis: Rectal cancer  INTERVAL HISTORY:   Mr. Stamas returns as scheduled.  He feels he is recovering well from recent surgery.  He continues to note soreness at the left chest.  No shortness of breath.  No fever.  He has an occasional cough.  Colostomy functioning normally.  Objective:  Vital signs in last 24 hours:  Blood pressure 128/90, pulse 81, temperature 98 F (36.7 C), temperature source Temporal, resp. rate 17, height 5\' 9"  (1.753 m), weight 210 lb 11.2 oz (95.6 kg), SpO2 98 %.    Resp: Lungs clear bilaterally. Cardio: Regular rate and rhythm. GI: Abdomen soft and nontender.  No hepatomegaly. Vascular: No leg edema. Neuro: Alert and oriented. Skin: Healed surgical incisions left chest.   Lab Results:  Lab Results  Component Value Date   WBC 6.7 10/24/2019   HGB 14.7 10/24/2019   HCT 43.8 10/24/2019   MCV 93.0 10/24/2019   PLT 233 10/24/2019   NEUTROABS 1.6 (L) 10/30/2018    Imaging:  No results found.  Medications: I have reviewed the patient's current medications.  Assessment/Plan: 1. Rectal cancer ? Colonoscopy 09/08/2017, rectal mass at 5 cm from the anal verge, biopsy confirmed invasive adenocarcinoma ? Staging CTs 09/15/2017, rectal mass and perirectal lymphadenopathy, indeterminate right upper lobe nodule ? Elevated CEA ? MRI pelvis 10/07/2017-5.3 cm circumferential lower rectal mass with perirectal extension along the inferior mesenteric vein;T3, N1; distance from tumor to the anal sphincter is 5 cm. ? Initiation of radiation and concurrent Xeloda chemotherapy 10/12/2017;completion of radiation/Xeloda 11/23/2017. ? Surgery 02/01/18 per Dr. Nadeen Landau; laparoscopic lower anterior rectosigmoid resection, coloanal anastomosis, diverting loop ileostomy, flexible sigmoidoscopy; ypT3, pN0 ? Developed low-grade fever and abdominal pain on POD 4, CT AP on 02/06/18 and 02/09/18 shows small loculated  fluid collections in RLQ and presacral space, consulted IR for transgluteal presacral abscess drain, treated with IV zosyn then transitioned to po cipro and flagyl ? Follow-up CT 03/24/2018-2.2 x 4.9 x 4.0 cm thick-walled presacral gas and fluid collection/abscess, minimally increased status post removal of surgical drain. No evidence of recurrent or metastatic disease. ? Cycle 1 adjuvant Xeloda 04/12/2018 ? Cycle 2 adjuvant Xeloda 05/03/2018 ? Cycle 3 adjuvant Xeloda 06/05/2018 (Xeloda dose reduced to 1500 mg twice daily secondary to mild neutropenia) ? Cycle 4adjuvant Xeloda 06/26/2018 ? Cycle 5 adjuvant Xeloda 07/18/2018 ? CTs 08/21/2018-no evidence of tumor recurrence or metastatic disease in the chest, abdomen or pelvis. Presacral soft tissue thickening and some calcifications compatible with postradiation changes. Stable probablybenign 3 mm right upper lobe nodule. ? CTs 09/18/2019-new 1.2 cm pulmonary nodule within the left upper lobe; stable 3 mm nodule right upper lobe. ? PET scan 10/03/2019-hypermetabolic left upper lobe nodule, no evidence of local recurrence, no other evidence of metastatic disease ? Left video-assisted thoracoscopy, wedge resection 10/22/2019-left upper lobe wedge resection with metastatic adenocarcinoma (positive for cytokeratin 20 and CDX2, negative for cytokeratin 7 and TTF-1), no carcinoma identified in one lymph node, margins uninvolved by carcinoma; 10 L lymph node biopsy with no carcinoma identified; left pleural biopsy with no carcinoma identified.  2.Pain and bleeding secondary to #1.Resolved.  3.Skin breakdown at the gluteal fold secondary to radiation. Resolved.  4.History ofneutropenia secondary to chemotherapy  Disposition: Mr. Yepes appears stable.  He is recovering from the recent lung surgery.  We reviewed the pathology report.  He understands the lung nodule showed metastatic adenocarcinoma consistent with a metastasis from a gastrointestinal  primary.  Dr.  Sherrill recommends chemotherapy with either FOLFOX or CAPOX.  We reviewed potential toxicities associated with chemotherapy including bone marrow toxicity, nausea, hair loss, allergic reaction.  We discussed the various forms of neuropathy associated with oxaliplatin.  We reviewed potential toxicities associated with 5-fluorouracil including mouth sores, diarrhea, hand-foot syndrome, skin hyperpigmentation, increased sensitivity to sun.  He understands we recommend placement of a Port-A-Cath for either regimen.  He is undecided regarding proceeding with chemotherapy.  He will return for a follow-up visit on 11/22/2019 for further discussion.  Patient seen with Dr. Benay Spice.  25 minutes were spent face-to-face at today's visit with the majority of that time involved in counseling/coordination of care.    Ned Card ANP/GNP-BC   11/12/2019  3:01 PM This was a shared visit with Ned Card.  Mr. Wintle was confirmed to have recurrent rectal cancer on the lung resection.  We discussed "adjuvant "treatment options with him.  He has a significant chance of developing progression of rectal cancer over the next several years.  I explained there is no "standard "recommendation for systemic therapy in this setting.  It is unclear whether chemotherapy will increase the chance of cure in this setting.  However, at his age and with the potential to be cured I recommend adjuvant therapy.  We discussed observation versus a trial of 5-FU/oxaliplatin therapy.  We discussed CAPOX and FOLFOX.  I do not recommend further treatment with single agent capecitabine.  We reviewed potential toxicities associated with oxaliplatin.  Mr. Herder is undecided on chemotherapy.  He will return for office visit and further discussion next week.  Julieanne Manson, MD

## 2019-11-12 NOTE — Telephone Encounter (Signed)
Scheduled per 12/28 los, patient is aware of upcoming appointments.

## 2019-11-22 ENCOUNTER — Telehealth: Payer: Self-pay | Admitting: *Deleted

## 2019-11-22 ENCOUNTER — Inpatient Hospital Stay: Payer: 59 | Attending: Nurse Practitioner | Admitting: Nurse Practitioner

## 2019-11-22 ENCOUNTER — Other Ambulatory Visit: Payer: 59

## 2019-11-22 DIAGNOSIS — C20 Malignant neoplasm of rectum: Secondary | ICD-10-CM

## 2019-11-22 NOTE — Progress Notes (Signed)
Attempted to contact patient by telephone at approximately 2:15 PM.  There was no answer.  Message left for him to please contact the office to reschedule.

## 2019-11-22 NOTE — Telephone Encounter (Signed)
Left VM that he is not feeling well today and requesting virtual visit instead. Returned call and left VM that NP will call him today for a telephone visit. He does not have Mychart ,so the virtual visit is not possible.

## 2019-12-11 ENCOUNTER — Other Ambulatory Visit: Payer: Self-pay

## 2019-12-14 ENCOUNTER — Telehealth: Payer: Self-pay | Admitting: Nurse Practitioner

## 2019-12-14 NOTE — Telephone Encounter (Signed)
I left a message on voicemail asking Mr. Smart to please call us back to schedule a follow-up appointment.

## 2019-12-19 ENCOUNTER — Telehealth: Payer: Self-pay | Admitting: Oncology

## 2019-12-19 ENCOUNTER — Other Ambulatory Visit: Payer: Self-pay | Admitting: Cardiothoracic Surgery

## 2019-12-19 DIAGNOSIS — R911 Solitary pulmonary nodule: Secondary | ICD-10-CM

## 2019-12-19 NOTE — Progress Notes (Unsigned)
cxr 

## 2019-12-19 NOTE — Telephone Encounter (Signed)
Scheduled appt per 1/29 sch message - unable to reach pt . Left message with appt date and time

## 2019-12-20 ENCOUNTER — Ambulatory Visit (INDEPENDENT_AMBULATORY_CARE_PROVIDER_SITE_OTHER): Payer: Self-pay | Admitting: Cardiothoracic Surgery

## 2019-12-20 ENCOUNTER — Other Ambulatory Visit: Payer: Self-pay

## 2019-12-20 ENCOUNTER — Ambulatory Visit
Admission: RE | Admit: 2019-12-20 | Discharge: 2019-12-20 | Disposition: A | Discharge status: Home / Self Care | Payer: Self-pay | Source: Ambulatory Visit | Attending: Cardiothoracic Surgery | Admitting: Cardiothoracic Surgery

## 2019-12-20 VITALS — BP 132/93 | HR 68 | Temp 97.9°F | Resp 20 | Ht 69.0 in | Wt 205.0 lb

## 2019-12-20 DIAGNOSIS — C7800 Secondary malignant neoplasm of unspecified lung: Secondary | ICD-10-CM

## 2019-12-20 DIAGNOSIS — C189 Malignant neoplasm of colon, unspecified: Secondary | ICD-10-CM

## 2019-12-20 DIAGNOSIS — R911 Solitary pulmonary nodule: Secondary | ICD-10-CM

## 2019-12-20 NOTE — Progress Notes (Signed)
[address]       [place] [address]             [phone]                  Craig [name] Date of Birth: [birth year]  Referring PI:Craig [name], Craig Price, MD Primary Cardiology: Primary Care:Patient, No Pcp Per  Chief Complaint:  Follow Up Visit OPERATIVE REPORT DATE OF PROCEDURE: 10/22/2019 PREOPERATIVE DIAGNOSIS: New left upper lobe lung lesion suggestive of rectal carcinoma metastasis. POSTOPERATIVE DIAGNOSIS: New left upper lobe lung lesion suggestive of rectal carcinoma metastasis. SURGICAL PROCEDURE: Bronchoscopy, left video-assisted thoracoscopy, wedge resection, left upper lobe x2, Lymph Node Samplingmini left thoracotomy, pleural biopsy x1, and intercostal nerve blocks with Exparel.  FINAL MICROSCOPIC DIAGNOSIS:   A. PLEURA, LEFT, BIOPSY:  - No carcinoma identified   B. LUNG, LEFT UPPER LOBE, WEDGE RESECTION:  - No carcinoma identified   C. LUNG, LEFT UPPER LOBE, WEDGE RESECTION:  - Metastatic adenocarcinoma  - No carcinoma identified in one lymph node (0/1)  - Margins uninvolved by carcinoma  - See comment   D. LYMPH NODE, 10L, BIOPSY:  - No carcinoma identified in one lymph node (0/1)   COMMENT:   C. By immunohistochemistry the neoplastic cells are positive for  cytokeratin 20 and CDX2 but negative for cytokeratin 7 and TTF-1. The  overall morphology and immunophenotype are consistent with metastasis  from the patient's known gastrointestinal primary. Dr. Jeannie [name] reviewed  the case and agrees with the above diagnosis.    INTRAOPERATIVE DIAGNOSIS:   A. LEFT PLEURAL BIOPSY: FOCAL ATYPICAL CELLS WITH ASSOCIATED  INFLAMMATION. PER DR. Melina [name]. 10/22/2019.  B. LEFT UPPER LOBE LUNG WEDGE RESECTION, GROSS OR CONSULT: NO NODULE  IDENTIFIED GROSSLY. CALLED TO OR 14/DR. Ananya [name] AT 10 AM PER DR.  CANACCI.  C. LEFT UPPER LOBE LUNG WEDGE RESECTION #2, MASS: CONSISTENT WITH  METASTATIC  ADENOCARCINOMA. CALL TO OR 14/DR. Latoyia [name] AT 11 AM. PER  DR. CANACCI.  History of Present Illness:     Patient doing well following recent resection of isolated left upper lobe lung mass.  Patient notes that she is progressing well.  Has been in discussion with Dr. Benay [name] about further chemotherapy IV or oral options.  He notes he plans on coming back today.  Patient has been increasing his physical activity well, and is actually interested in returning to work in mid February rather than waiting till March.  Denies fever chills or shortness of breath.  Is not requiring any pain medication.       Zubrod Score: At the time of surgery this patient's most appropriate activity status/level should be described as: [x]     0    Normal activity, no symptoms []     1    Restricted in physical strenuous activity but ambulatory, able to do out light work []     2    Ambulatory and capable of self care, unable to do work activities, up and about                 >50 % of waking hours                                                                                   []   3    Only limited self care, in bed greater than 50% of waking hours []     4    Completely disabled, no self care, confined to bed or chair []     5    Moribund  Social History   Tobacco Use  Smoking Status Former Smoker  Smokeless Tobacco Never Used  Tobacco Comment   quit at age 40       No Known Allergies  Current Outpatient Medications  Medication Sig Dispense Refill  . acetaminophen (TYLENOL) 500 MG tablet Take 1,000 mg by mouth every 6 (six) hours as needed for moderate pain or headache.    Marland Kitchen aspirin-sod bicarb-citric acid (ALKA-SELTZER) 325 MG TBEF tablet Take 650 mg by mouth every 6 (six) hours as needed (heartburn).    . diphenhydrAMINE (SOMINEX) 25 MG tablet Take 50 mg by mouth at bedtime as needed for sleep.     . naphazoline-glycerin (CLEAR EYES REDNESS) 0.012-0.2 % SOLN Place 1-2 drops into both eyes 4 (four)  times daily as needed for eye irritation.     . naproxen sodium (ALEVE) 220 MG tablet Take 440 mg by mouth daily as needed (pain).    Marland Kitchen oxymetazoline (AFRIN) 0.05 % nasal spray Place 1 spray into both nostrils 2 (two) times daily as needed for congestion.     No current facility-administered medications for this visit.       Physical Exam: BP (!) 132/93 (BP Location: Right Arm, Patient Position: Sitting, Cuff Size: Normal)   Pulse 68   Temp 97.9 F (36.6 C) (Skin)   Resp 20   Ht 5\' 9"  (1.753 m)   Wt 205 lb (93 kg)   SpO2 97% Comment: RA  BMI 30.27 kg/m   General appearance: alert and cooperative Neurologic: intact Heart: regular rate and rhythm, S1, S2 normal, no murmur, click, rub or gallop Lungs: clear to auscultation bilaterally Abdomen: soft, non-tender; bowel sounds normal; no masses,  no organomegaly Extremities: extremities normal, atraumatic, no cyanosis or edema and Homans sign is negative, no sign of DVT Wound: Left chest incisions and port sites are well-healed Wounds:  Diagnostic Studies & Laboratory data:         Recent Radiology Findings: DG Chest 2 View  Result Date: 12/20/2019 CLINICAL DATA:  Metastatic adenocarcinoma to the left upper lobe. Status post wedge resection of the left upper lobe. History of rectal cancer. EXAM: CHEST - 2 VIEW COMPARISON:  Chest x-rays dated 11/07/2019 and 10/22/2019 FINDINGS: There is minimal residual haziness in the left upper lobe medially at the site of previous wedge resection. Surgical staples are present in that area. No pneumothorax. Resolution of the small left pleural effusion. Right lung is clear. Heart size and vascularity are normal. No bone abnormality. IMPRESSION: Minimal residual haziness in the left upper lobe medially at the site of previous wedge resection. Resolution of the small left effusion. Electronically Signed   By: Lorriane Shire M.D.   On: 12/20/2019 10:45      I have independently reviewed the above  radiology findings and reviewed findings  with the patient.  Recent Labs: Lab Results  Component Value Date   WBC 6.7 10/24/2019   HGB 14.7 10/24/2019   HCT 43.8 10/24/2019   PLT 233 10/24/2019   GLUCOSE 99 10/24/2019   ALT 46 (H) 10/24/2019   AST 42 (H) 10/24/2019   NA 137 10/24/2019   K 4.1 10/24/2019   CL 104 10/24/2019   CREATININE 1.28 (H) 10/24/2019  BUN 14 10/24/2019   CO2 24 10/24/2019   TSH 1.442 07/25/2012   INR 0.9 10/18/2019   HGBA1C 5.4 01/30/2018      Assessment / Plan:   Patient doing well following resection of isolated metastasis from the colon to the left lung Plan to release to full duty at work February 15 Patient notes that he plans to call Dr. Benay [name] back today to discuss further therapy Plan to see the patient back as necessary, he will continue surveillance scans through Dr. Benay [name].   Medication Changes: No orders of the defined types were placed in this encounter.   Grace Isaac 12/20/2019 11:47 AM

## 2019-12-26 ENCOUNTER — Telehealth: Payer: Self-pay | Admitting: *Deleted

## 2019-12-26 NOTE — Telephone Encounter (Signed)
Unable to reach patient to confirm his appointment tomorrow at 0800. Was able to speak w/his significant other, Estalla and she agrees to tell him

## 2019-12-27 ENCOUNTER — Ambulatory Visit: Payer: Self-pay | Admitting: Oncology

## 2020-02-27 ENCOUNTER — Telehealth: Payer: Self-pay | Admitting: Oncology

## 2020-02-27 NOTE — Telephone Encounter (Signed)
Scheduled appt per 4/14 sch message

## 2020-03-06 ENCOUNTER — Ambulatory Visit: Payer: Self-pay | Admitting: Surgery

## 2020-03-06 NOTE — Progress Notes (Signed)
Spoke with Sharyn Lull at Dr. Orest Dikes office to request orders in epic.

## 2020-03-06 NOTE — Patient Instructions (Addendum)
DUE TO COVID-19 ONLY ONE VISITOR ARE ALLOWED TO COME WITH YOU AND STAY IN THE WAITING ROOM ONLY DURING PRE OP AND PROCEDURE. THEN TWO VISITORS MAY VISIT WITH YOU IN YOUR PRIVATE ROOM DURING VISITING HOURS ONLY!!   COVID SWAB TESTING MUST BE COMPLETED ON:  Monday, Mar 17, 2020 at 9:45 AM   [address], [place] -[facility] enter pre surgical testing line (Must self quarantine after testing. Follow instructions on handout.)             Your procedure is scheduled on: Thursday, Mar 20, 2020   Report to [facility] Main  Entrance    Report to admitting at 1:00 PM   Call this number if you have problems the morning of surgery [phone]   Drink 2 Ensure drinks the night before surgery at 10:00PM    Do not eat food :After Midnight.   May have liquids until 12 Noon day of surgery   CLEAR LIQUID DIET  Foods Allowed                                                                     Foods Excluded  Water, Black Coffee and tea, regular and decaf                             liquids that you cannot  Plain Jell-O in any flavor  (No red)                                           see through such as: Fruit ices (not with fruit pulp)                                     milk, soups, orange juice  Iced Popsicles (No red)                                    All solid food Carbonated beverages, regular and diet                                    Apple juices Sports drinks like Gatorade (No red) Lightly seasoned clear broth or consume(fat free) Sugar, honey syrup  Sample Menu Breakfast                                Lunch                                     Supper Cranberry juice                    Beef broth  Chicken broth Jell-O                                     Grape juice                           Apple juice Coffee or tea                        Jell-O                                      Popsicle              Coffee or tea                        Coffee or tea   Complete one Ensure drink 12 NOON day of surgery.   Oral Hygiene is also important to reduce your risk of infection.                                    Remember - BRUSH YOUR TEETH THE MORNING OF SURGERY WITH YOUR REGULAR TOOTHPASTE   Do NOT smoke after Midnight   Take these medicines the morning of surgery with A SIP OF WATER: NONE   May use eye drops and nasal spray day of surgery                               You may not have any metal on your body including jewelry, and body piercings             Do not wear lotions, powders, perfumes/cologne, or deodorant                           Men may shave face and neck.   Do not bring valuables to the hospital. [place].   Contacts, dentures or bridgework may not be worn into surgery.   Bring small overnight bag day of surgery.    Special Instructions: Bring a copy of your healthcare power of attorney and living will documents         the day of surgery if you haven't scanned them in before.              Please read over the following fact sheets you were given: IF YOU HAVE QUESTIONS ABOUT YOUR PRE OP INSTRUCTIONS PLEASE CALL [phone]   [place] - Preparing for Surgery Before surgery, you can play an important role.  Because skin is not sterile, your skin needs to be as free of germs as possible.  You can reduce the number of germs on your skin by washing with CHG (chlorahexidine gluconate) soap before surgery.  CHG is an antiseptic cleaner which kills germs and bonds with the skin to continue killing germs even after washing. Please DO NOT use if you have an allergy to CHG or antibacterial soaps.  If your skin becomes reddened/irritated stop using the CHG and  inform your nurse when you arrive at Short Stay. Do not shave (including legs and underarms) for at least 48 hours prior to the first CHG shower.  You may shave  your face/neck.  Please follow these instructions carefully:  1.  Shower with CHG Soap the night before surgery and the  morning of surgery.  2.  If you choose to wash your hair, wash your hair first as usual with your normal  shampoo.  3.  After you shampoo, rinse your hair and body thoroughly to remove the shampoo.                             4.  Use CHG as you would any other liquid soap.  You can apply chg directly to the skin and wash.  Gently with a scrungie or clean washcloth.  5.  Apply the CHG Soap to your body ONLY FROM THE NECK DOWN.   Do   not use on face/ open                           Wound or open sores. Avoid contact with eyes, ears mouth and   genitals (private parts).                       Wash face,  Genitals (private parts) with your normal soap.             6.  Wash thoroughly, paying special attention to the area where your    surgery  will be performed.  7.  Thoroughly rinse your body with warm water from the neck down.  8.  DO NOT shower/wash with your normal soap after using and rinsing off the CHG Soap.                9.  Pat yourself dry with a clean towel.            10.  Wear clean pajamas.            11.  Place clean sheets on your bed the night of your first shower and do not  sleep with pets. Day of Surgery : Do not apply any lotions/deodorants the morning of surgery.  Please wear clean clothes to the hospital/surgery center.  FAILURE TO FOLLOW THESE INSTRUCTIONS MAY RESULT IN THE CANCELLATION OF YOUR SURGERY  PATIENT SIGNATURE_________________________________  NURSE SIGNATURE__________________________________  ________________________________________________________________________   Adam Phenix  An incentive spirometer is a tool that can help keep your lungs clear and active. This tool measures how well you are filling your lungs with each breath. Taking long deep breaths may help reverse or decrease the chance of developing breathing  (pulmonary) problems (especially infection) following:  A long period of time when you are unable to move or be active. BEFORE THE PROCEDURE   If the spirometer includes an indicator to show your best effort, your nurse or respiratory therapist will set it to a desired goal.  If possible, sit up straight or lean slightly forward. Try not to slouch.  Hold the incentive spirometer in an upright position. INSTRUCTIONS FOR USE  1. Sit on the edge of your bed if possible, or sit up as far as you can in bed or on a chair. 2. Hold the incentive spirometer in an upright position. 3. Breathe out normally. 4. Place the mouthpiece in your mouth and seal  your lips tightly around it. 5. Breathe in slowly and as deeply as possible, raising the piston or the ball toward the top of the column. 6. Hold your breath for 3-5 seconds or for as long as possible. Allow the piston or ball to fall to the bottom of the column. 7. Remove the mouthpiece from your mouth and breathe out normally. 8. Rest for a few seconds and repeat Steps 1 through 7 at least 10 times every 1-2 hours when you are awake. Take your time and take a few normal breaths between deep breaths. 9. The spirometer may include an indicator to show your best effort. Use the indicator as a goal to work toward during each repetition. 10. After each set of 10 deep breaths, practice coughing to be sure your lungs are clear. If you have an incision (the cut made at the time of surgery), support your incision when coughing by placing a pillow or rolled up towels firmly against it. Once you are able to get out of bed, walk around indoors and cough well. You may stop using the incentive spirometer when instructed by your caregiver.  RISKS AND COMPLICATIONS  Take your time so you do not get dizzy or light-headed.  If you are in pain, you may need to take or ask for pain medication before doing incentive spirometry. It is harder to take a deep breath if you  are having pain. AFTER USE  Rest and breathe slowly and easily.  It can be helpful to keep track of a log of your progress. Your caregiver can provide you with a simple table to help with this. If you are using the spirometer at home, follow these instructions: [place] IF:   You are having difficultly using the spirometer.  You have trouble using the spirometer as often as instructed.  Your pain medication is not giving enough relief while using the spirometer.  You develop fever of 100.5 F (38.1 C) or higher. SEEK IMMEDIATE MEDICAL CARE IF:   You cough up bloody sputum that had not been present before.  You develop fever of 102 F (38.9 C) or greater.  You develop worsening pain at or near the incision site. MAKE SURE YOU:   Understand these instructions.  Will watch your condition.  Will get help right away if you are not doing well or get worse. Document Released: 03/14/2007 Document Revised: 01/24/2012 Document Reviewed: 05/15/2007 [facility] Patient Information 2014 [place], [place].   ________________________________________________________________________

## 2020-03-06 NOTE — H&P (Signed)
CC: Long-term f/u  PRIOR HISTORY: Craig [name] is a very pleasant 59 year old male referred to me by Dr. Laural Golden for evaluation of rectal cancer and we initially met him 09/2017. He underwent his first ever colonoscopy 09/08/17. Findings were remarkable for a partially obstructing tumor in the rectum approximate 5 cm needle verge by endoscopic evaluation. This is biopsied and returned invasive adenocarcinoma. The rest of his endoscopic exam was unremarkable.  He subsequently underwent staging CT chest/abdomen/pelvis which was notable for a bulky rectal mass with perirectal nodal metastases, nonspecific right upper lobe nodule but no other evidence of distant metastatic disease.  MRI Pelvis 10/07/17 - rectal mass with shortest distance from tumor to anal sphincter being 5 cm; tumor extending on the left and extending superiorly along the course of the IMV. One enlarged pathologic appearing 55m perirectal LN. On imaging this was read as a T3N1 lesion.  He subsequently underwent neoadjuvant chemoradiation with Xeloda under the care of Dr. SBenay Spice- which he completed 11/23/17. He tolerated this therapy well and noted substantial improvement in his symptoms throughout his treatment course. He no longer had partial obstructive symptoms, was tolerating a diet without nausea or vomiting and had noted substantially improved amounts of bleeding with bowel movements. The pelvic pain and experienced has also resolved.  He went to the OR 02/01/18 for laparoscopic ultra-low anterior resection, double stapled coloanal anastomosis, takedown of splenic flexure, flex sig and diverting loop ileostomy + Cysto/stents by urology. Postoperatively he developed a leak from his coloanal anastomosis that was initially controlled with the surgical drain and IV antibiotics. He also had some smaller fluid collections near the small bowel the right abdomen that were not amenable to percutaneous drainage and were managed with IV  antibiotics. The collection in the pelvis persisted on follow-up imaging and IR was consult for placement of percutaneous drain which they did through the left gluteal approach. He did well from this. Throughout all of this, he was tolerating a diet and had no symptoms. His ileostomy output was managed on Imodium. He is also Discharge hospital with a course of oral antibiotics-Cipro/Flagyl.   Path was reviewed with the patient - ISOLATED FOCI OF RESIDUAL ADENOCARCINOMA INVOLVING RECTOSIGMOID COLON. SEE NOTE. - TUMOR INVADES INTO PERICOLONIC SOFT TISSUE. - LARGEST FOCUS OF CARCINOMA MEASURES 0.2 CM. - NEAR COMPLETE TREATMENT RESPONSE (TUMOR REGRESSION SCORE 1). - ALL MARGINS ARE NEGATIVE FOR CARCINOMA. - NEGATIVE FOR LYMPHOVASCULAR OR PERINEURAL INVASION. - SIX LYMPH NODES, NEGATIVE FOR CARCINOMA (0/6). SEE NOTE - SEPARATE TUBULAR ADENOMA WITHOUT HIGH GRADE DYSPLASIA OR MALIGNANCY. Donuts negative. ypT3N0 (0/6)M0.  Repeat CT scan was obtained 02/24/18 - which demonstrated near complete resolution of the presacral fluid collection and near complete resolution of the fluid collection in the right lower quadrant and suprapubic abdominal wall. He continued his course of Cipro/Flagyl which he completed.  CT C/A/P 08/21/18 - no evidence of recurrence or metastatic disease  Completed adjuvant therapy with Xeloda 07/2018. GGE 09/04/18 = demonstrated a small presacral cavity communicating with the distal colorectal anastomosis in the region of the previous drain. The exam was otherwise unremarkable. Flexible sigmoidoscopy by myself 09/13/18 which demonstrated a patent and normal appearing colorectal anastomosis with the exception of some granulation tissue which was heaping up in the posterior midline. This is at the location of his sinus tract seen on his GGE.  Colonoscopy with Dr. RLaural Golden1/30/2020 which demonstrated some stool in the cecum. A patent end-to-end anastomosis was identified with  single staple and scar but no  ulceration or granulation tissue. He underwent repeat GGE 01/02/2019 which demonstrated no persistent leakage of contrast in the rectal area. Postradiation changes seen. F/u flex sig by myself 05/2019 showed patent anastomosis without evident sinus or granulation tissue  He has been doing well. He denies new complaints. He denies any drainage per rectum anymore. He is tolerating a diet. His ileostomy is working well.  INTERVAL HX In preparation for ileostomy takedown, he underwent surveillance CT chest/abdomen/pelvis11/01/2019. This unfortunately demonstrated a new 1.2 cm pulmonary nodule in the left upper lobe. This is new relative to his CT prior CTs. Normal-appearing anastomosis at the rectum. No other concerning findings. PET scan demonstrated mild activity at this location but no other concerning areas. He was seen by Dr. Servando Snare with cardiothoracic surgery and underwent wedge resection of the mass in his left chest. Pathology returned metastatic adenocarcinoma. Margins were negative. He has had a follow-up chest x-ray which demonstrates no concerning features. He returns today for follow-up and discussion of possible stoma reversal. He has been doing well. He denies any complaints today. His weight is stable. His ostomy is working well. He has request reversal. He denies any blood per rectum or abdominal pain.  Allergies (Tanisha A. Owens Shark, [place]; 02/26/2020 11:02 AM) No Known Drug Allergies [03/02/2018]: Allergies Reconciled   Medication History (Tanisha A. Owens Shark, [place]; 02/26/2020 11:02 AM) No Current Medications Medications Reconciled    Review of Systems Craig Gave M. Kyndel Egger [name]; 02/26/2020 11:10 AM) General Not Present- Anorexia, Appetite Loss, Chills and Fever. Skin Not Present- Brittle Nails and Bruising. HEENT Not Present- Double Vision and Headache. Neck Not Present- Neck Mass and Neck Pain. Respiratory Not Present- Bloody sputum,  Chronic Cough and Decreased Exercise Tolerance. Cardiovascular Not Present- Chest Pain and Difficulty Breathing Lying Down. Gastrointestinal Not Present- Abdominal Pain, Bloating, Bloody Stool, Change in Bowel Habits, Chronic diarrhea, Constipation, Difficulty Swallowing, Excessive gas, Gets full quickly at meals, Hemorrhoids, Indigestion, Nausea, Rectal Pain and Vomiting. Male Genitourinary Not Present- Blood in Urine and Change in Urinary Stream. Musculoskeletal Not Present- Back Pain and Backache. Neurological Not Present- Decreased Memory and Difficulty Speaking. Psychiatric Not Present- Anxiety and Depression. Endocrine Not Present- Appetite Changes and New Diabetes. Hematology Not Present- Easy Bleeding and Easy Bruising.  Vitals (Tanisha A. Brown RMA; 02/26/2020 11:02 AM) 02/26/2020 11:02 AM Weight: 213.8 lb Height: 69in Body Surface Area: 2.13 m Body Mass Index: 31.57 kg/m  Temp.: 73F  Pulse: 79 (Regular)  BP: 134/84 (Sitting, Left Arm, Standard)       Physical Exam Craig Gave M. Codey Burling [name]; 02/26/2020 11:11 AM) The physical exam findings are as follows: Note:Constitutional: No acute distress; conversant; wearing surgical mask Eyes: Moist conjunctiva; no lid lag; anicteric sclerae Neck: Trachea midline; no palpable thyromegaly Lungs: Normal respiratory effort; no tactile fremitus CV: rrr; no palpable thrill; no pitting edema GI: Abdomen soft, nontender, nondistended; no palpable hepatosplenomegaly. Stoma pink, protruding nicely. Toothpaste consistency output in appliance - yellow in color MSK: Normal gait; no clubbing/cyanosis Psychiatric: Appropriate affect; alert and oriented 3 Lymphatic: No palpable cervical or axillary lymphadenopathy    Assessment & Plan Craig [name]; 02/26/2020 11:13 AM) RECTAL CANCER (C20) Story: 2M with low rectal cancer now s/p neoadjuvant chemoXRT with Xeloda and surgery 3/20 - lap ultralow AR with DLI and double  stapled coloanal anastomosis - 1.5cm distal margin but this was below the mesorectal margin (bare area) - subsequent anastomotic leak controlled with ileostomy and drains, resolved clinically and they were ultimately removed. Path ypT3N0 (0/6) M0; complete  TME with uninvolved CRM. Completed adjuvant Xeloda 07/18/18 CT C/A/P 08/21/18 - no evidence of recurrence/metastatic disease GGE 09/04/18 - small presacral sinus at site of prior leak Flex sig 09/13/18 - granulation tissue posterior midline at anastomosis GGE 01/02/2019 - no evidence of persistent leak/sinus Flex sig 05/2019 - no granulation tissue or evidence of sinus - appears to have healed  Surveillance CT C/A/P 09/18/2019 - new 1.2 cm pulmonary nodule in LUL - wedge resection Dr. Servando Snare 10/2019 - metastatic adenoCA, margins clear Impression: -The anatomy and physiology of the GI tract was discussed at length with the patient and in particular how it pertains to his current anatomy with diverting loop ileostomy -He has requested ileostomy reversal. We discussed takedown of loop ileostomy. -The planned procedure, material risks (including, but not limited to, pain, bleeding, infection, scarring, need for blood transfusion, damage to surrounding structures- blood vessels/nerves/viscus/organs, leak from anastomosis, need for additional procedures, worsening of pre-existing medical conditions, need for subsequent stoma which may be permanent, hernia, recurrence, DVT/PE, pneumonia, heart attack, stroke, death) benefits and alternatives to surgery were discussed at length. The patient's questions were answered to his satisfaction, he voiced understanding and elected to proceed with surgery. Additionally, we discussed typical postoperative expectations and the recovery process.  This patient encounter took 45 minutes today to perform the following: take history, perform exam, review outside records, interpret imaging, counsel the patient on their diagnosis  and document encounter, findings & plan in the [facility] (Z93.2)  Signed by Ileana Roup, [name] (02/26/2020 11:14 AM)

## 2020-03-11 ENCOUNTER — Encounter (HOSPITAL_COMMUNITY): Payer: Self-pay

## 2020-03-11 ENCOUNTER — Encounter (HOSPITAL_COMMUNITY)
Admission: RE | Admit: 2020-03-11 | Discharge: 2020-03-11 | Disposition: A | Discharge status: Home / Self Care | Payer: 59 | Source: Ambulatory Visit | Attending: Surgery | Admitting: Surgery

## 2020-03-11 ENCOUNTER — Other Ambulatory Visit: Payer: Self-pay

## 2020-03-11 DIAGNOSIS — Z01812 Encounter for preprocedural laboratory examination: Secondary | ICD-10-CM | POA: Diagnosis not present

## 2020-03-11 LAB — CBC WITH DIFFERENTIAL/PLATELET
Abs Immature Granulocytes: 0.01 10*3/uL (ref 0.00–0.07)
Basophils Absolute: 0 10*3/uL (ref 0.0–0.1)
Basophils Relative: 1 %
Eosinophils Absolute: 0.2 10*3/uL (ref 0.0–0.5)
Eosinophils Relative: 5 %
HCT: 46.8 % (ref 39.0–52.0)
Hemoglobin: 15.4 g/dL (ref 13.0–17.0)
Immature Granulocytes: 0 %
Lymphocytes Relative: 15 %
Lymphs Abs: 0.5 10*3/uL — ABNORMAL LOW (ref 0.7–4.0)
MCH: 29.8 pg (ref 26.0–34.0)
MCHC: 32.9 g/dL (ref 30.0–36.0)
MCV: 90.5 fL (ref 80.0–100.0)
Monocytes Absolute: 0.3 10*3/uL (ref 0.1–1.0)
Monocytes Relative: 9 %
Neutro Abs: 2.5 10*3/uL (ref 1.7–7.7)
Neutrophils Relative %: 70 %
Platelets: 223 10*3/uL (ref 150–400)
RBC: 5.17 MIL/uL (ref 4.22–5.81)
RDW: 13.7 % (ref 11.5–15.5)
WBC: 3.5 10*3/uL — ABNORMAL LOW (ref 4.0–10.5)
nRBC: 0 % (ref 0.0–0.2)

## 2020-03-11 LAB — COMPREHENSIVE METABOLIC PANEL
ALT: 41 U/L (ref 0–44)
AST: 28 U/L (ref 15–41)
Albumin: 3.7 g/dL (ref 3.5–5.0)
Alkaline Phosphatase: 114 U/L (ref 38–126)
Anion gap: 11 (ref 5–15)
BUN: 17 mg/dL (ref 6–20)
CO2: 21 mmol/L — ABNORMAL LOW (ref 22–32)
Calcium: 8.9 mg/dL (ref 8.9–10.3)
Chloride: 109 mmol/L (ref 98–111)
Creatinine, Ser: 1.32 mg/dL — ABNORMAL HIGH (ref 0.61–1.24)
GFR calc Af Amer: >60 mL/min (ref >60)
GFR calc non Af Amer: 59 mL/min — ABNORMAL LOW (ref >60)
Glucose, Bld: 143 mg/dL — ABNORMAL HIGH (ref 70–99)
Potassium: 3.6 mmol/L (ref 3.5–5.1)
Sodium: 141 mmol/L (ref 135–145)
Total Bilirubin: 0.9 mg/dL (ref 0.3–1.2)
Total Protein: 7.5 g/dL (ref 6.5–8.1)

## 2020-03-11 LAB — PROTIME-INR
INR: 1 (ref 0.8–1.2)
Prothrombin Time: 12.6 seconds (ref 11.4–15.2)

## 2020-03-11 LAB — HEMOGLOBIN A1C
Hgb A1c MFr Bld: 6.1 % — ABNORMAL HIGH (ref 4.8–5.6)
Mean Plasma Glucose: 128.37 mg/dL

## 2020-03-11 LAB — APTT: aPTT: 29 seconds (ref 24–36)

## 2020-03-11 NOTE — Progress Notes (Signed)
PCP - N/A Cardiologist - N/A  Chest x-ray - 12/20/19 in epic EKG - 10/18/19 in epic Stress Test - N/A ECHO - greater than 2 years Cardiac Cath - N/A  Sleep Study - N/A CPAP - N/A  Fasting Blood Sugar - N/A Checks Blood Sugar __N/A___ times a day  Blood Thinner Instructions: N/A Aspirin Instructions: N/A Last Dose: N/A  Anesthesia review: N/A  Patient denies shortness of breath, fever, cough and chest pain at PAT appointment   Patient verbalized understanding of instructions that were given to them at the PAT appointment. Patient was also instructed that they will need to review over the PAT instructions again at home before surgery.

## 2020-03-17 ENCOUNTER — Other Ambulatory Visit (HOSPITAL_COMMUNITY)
Admission: RE | Admit: 2020-03-17 | Discharge: 2020-03-17 | Disposition: A | Discharge status: Home / Self Care | Payer: 59 | Source: Ambulatory Visit | Attending: Surgery | Admitting: Surgery

## 2020-03-17 LAB — SARS CORONAVIRUS 2 (TAT 6-24 HRS): SARS Coronavirus 2: NEGATIVE

## 2020-03-19 MED ORDER — BUPIVACAINE LIPOSOME 1.3 % IJ SUSP
20.0000 mL | Freq: Once | INTRAMUSCULAR | Status: DC
  Filled 2020-03-19: qty 20

## 2020-03-20 ENCOUNTER — Inpatient Hospital Stay (HOSPITAL_COMMUNITY): Payer: 59 | Admitting: Anesthesiology

## 2020-03-20 ENCOUNTER — Encounter (HOSPITAL_COMMUNITY)
Admission: RE | Disposition: A | Discharge status: Home / Self Care | Payer: Self-pay | Source: Ambulatory Visit | Attending: Surgery

## 2020-03-20 ENCOUNTER — Encounter (HOSPITAL_COMMUNITY): Payer: Self-pay | Admitting: Surgery

## 2020-03-20 ENCOUNTER — Inpatient Hospital Stay (HOSPITAL_COMMUNITY)
Admission: RE | Admit: 2020-03-20 | Discharge: 2020-03-22 | DRG: 330 | Disposition: A | Discharge status: Home / Self Care | Payer: 59 | Attending: Surgery | Admitting: Surgery

## 2020-03-20 ENCOUNTER — Other Ambulatory Visit: Payer: Self-pay

## 2020-03-20 DIAGNOSIS — Z9889 Other specified postprocedural states: Secondary | ICD-10-CM | POA: Diagnosis present

## 2020-03-20 DIAGNOSIS — Z20822 Contact with and (suspected) exposure to covid-19: Secondary | ICD-10-CM | POA: Diagnosis present

## 2020-03-20 DIAGNOSIS — Z806 Family history of leukemia: Secondary | ICD-10-CM | POA: Diagnosis not present

## 2020-03-20 DIAGNOSIS — Z85048 Personal history of other malignant neoplasm of rectum, rectosigmoid junction, and anus: Secondary | ICD-10-CM

## 2020-03-20 DIAGNOSIS — Z432 Encounter for attention to ileostomy: Secondary | ICD-10-CM | POA: Diagnosis present

## 2020-03-20 DIAGNOSIS — Z87891 Personal history of nicotine dependence: Secondary | ICD-10-CM

## 2020-03-20 DIAGNOSIS — Z923 Personal history of irradiation: Secondary | ICD-10-CM

## 2020-03-20 DIAGNOSIS — C7802 Secondary malignant neoplasm of left lung: Secondary | ICD-10-CM | POA: Diagnosis present

## 2020-03-20 DIAGNOSIS — Z9049 Acquired absence of other specified parts of digestive tract: Secondary | ICD-10-CM

## 2020-03-20 DIAGNOSIS — Z9221 Personal history of antineoplastic chemotherapy: Secondary | ICD-10-CM | POA: Diagnosis not present

## 2020-03-20 HISTORY — PX: ILEOSTOMY CLOSURE: SHX1784

## 2020-03-20 LAB — TYPE AND SCREEN
ABO/RH(D): O POS
Antibody Screen: NEGATIVE

## 2020-03-20 SURGERY — CLOSURE, ILEOSTOMY
Anesthesia: General

## 2020-03-20 MED ORDER — 0.9 % SODIUM CHLORIDE (POUR BTL) OPTIME
TOPICAL | Status: DC | PRN
  Administered 2020-03-20: 14:00:00 2000 mL

## 2020-03-20 MED ORDER — MIDAZOLAM HCL 2 MG/2ML IJ SOLN
INTRAMUSCULAR | Status: DC | PRN
  Administered 2020-03-20: 2 mg via INTRAVENOUS

## 2020-03-20 MED ORDER — FENTANYL CITRATE (PF) 250 MCG/5ML IJ SOLN
INTRAMUSCULAR | Status: AC
  Filled 2020-03-20: qty 5

## 2020-03-20 MED ORDER — SUCCINYLCHOLINE CHLORIDE 200 MG/10ML IV SOSY
PREFILLED_SYRINGE | INTRAVENOUS | Status: AC
  Filled 2020-03-20: qty 10

## 2020-03-20 MED ORDER — ONDANSETRON HCL 4 MG/2ML IJ SOLN
4.0000 mg | Freq: Four times a day (QID) | INTRAMUSCULAR | Status: DC | PRN
  Administered 2020-03-20: 4 mg via INTRAVENOUS
  Filled 2020-03-20: qty 2

## 2020-03-20 MED ORDER — DEXAMETHASONE SODIUM PHOSPHATE 10 MG/ML IJ SOLN
INTRAMUSCULAR | Status: AC
  Filled 2020-03-20: qty 1

## 2020-03-20 MED ORDER — SUCCINYLCHOLINE CHLORIDE 200 MG/10ML IV SOSY
PREFILLED_SYRINGE | INTRAVENOUS | Status: DC | PRN
  Administered 2020-03-20: 140 mg via INTRAVENOUS

## 2020-03-20 MED ORDER — DIPHENHYDRAMINE HCL 12.5 MG/5ML PO ELIX: 12.5000 mg | ORAL_SOLUTION | Freq: Four times a day (QID) | ORAL | Status: DC | PRN

## 2020-03-20 MED ORDER — ALUM & MAG HYDROXIDE-SIMETH 200-200-20 MG/5ML PO SUSP
30.0000 mL | Freq: Four times a day (QID) | ORAL | Status: DC | PRN
  Administered 2020-03-20 – 2020-03-21 (×2): 30 mL via ORAL
  Filled 2020-03-20 (×2): qty 30

## 2020-03-20 MED ORDER — IBUPROFEN 400 MG PO TABS
600.0000 mg | ORAL_TABLET | Freq: Four times a day (QID) | ORAL | Status: DC | PRN
  Administered 2020-03-20: 600 mg via ORAL
  Filled 2020-03-20: qty 1

## 2020-03-20 MED ORDER — LACTATED RINGERS IV SOLN: INTRAVENOUS | Status: DC

## 2020-03-20 MED ORDER — LIDOCAINE 2% (20 MG/ML) 5 ML SYRINGE
INTRAMUSCULAR | Status: AC
  Filled 2020-03-20: qty 5

## 2020-03-20 MED ORDER — LIDOCAINE 2% (20 MG/ML) 5 ML SYRINGE
INTRAMUSCULAR | Status: DC | PRN
  Administered 2020-03-20: 60 mg via INTRAVENOUS
  Administered 2020-03-20: 100 mg via INTRAVENOUS

## 2020-03-20 MED ORDER — MEPERIDINE HCL 50 MG/ML IJ SOLN: 6.2500 mg | INTRAMUSCULAR | Status: DC | PRN

## 2020-03-20 MED ORDER — BUPIVACAINE HCL (PF) 0.25 % IJ SOLN
INTRAMUSCULAR | Status: AC
  Filled 2020-03-20: qty 30

## 2020-03-20 MED ORDER — ROCURONIUM BROMIDE 10 MG/ML (PF) SYRINGE
PREFILLED_SYRINGE | INTRAVENOUS | Status: AC
  Filled 2020-03-20: qty 10

## 2020-03-20 MED ORDER — ACETAMINOPHEN 500 MG PO TABS
1000.0000 mg | ORAL_TABLET | Freq: Four times a day (QID) | ORAL | Status: DC
  Administered 2020-03-20 – 2020-03-22 (×7): 1000 mg via ORAL
  Filled 2020-03-20 (×6): qty 2

## 2020-03-20 MED ORDER — ONDANSETRON HCL 4 MG/2ML IJ SOLN
INTRAMUSCULAR | Status: AC
  Filled 2020-03-20: qty 2

## 2020-03-20 MED ORDER — SUGAMMADEX SODIUM 200 MG/2ML IV SOLN
INTRAVENOUS | Status: DC | PRN
  Administered 2020-03-20: 300 mg via INTRAVENOUS

## 2020-03-20 MED ORDER — CHLORHEXIDINE GLUCONATE CLOTH 2 % EX PADS: 6.0000 | MEDICATED_PAD | Freq: Once | CUTANEOUS | Status: DC

## 2020-03-20 MED ORDER — HEPARIN SODIUM (PORCINE) 5000 UNIT/ML IJ SOLN
5000.0000 [IU] | Freq: Once | INTRAMUSCULAR | Status: AC
  Administered 2020-03-20: 5000 [IU] via SUBCUTANEOUS
  Filled 2020-03-20: qty 1

## 2020-03-20 MED ORDER — ALVIMOPAN 12 MG PO CAPS
12.0000 mg | ORAL_CAPSULE | Freq: Two times a day (BID) | ORAL | Status: DC
  Administered 2020-03-21: 12 mg via ORAL
  Filled 2020-03-20 (×2): qty 1

## 2020-03-20 MED ORDER — DEXAMETHASONE SODIUM PHOSPHATE 10 MG/ML IJ SOLN
INTRAMUSCULAR | Status: DC | PRN
  Administered 2020-03-20: 10 mg via INTRAVENOUS

## 2020-03-20 MED ORDER — ENSURE SURGERY PO LIQD
237.0000 mL | Freq: Two times a day (BID) | ORAL | Status: DC
  Administered 2020-03-21: 237 mL via ORAL
  Filled 2020-03-20 (×4): qty 237

## 2020-03-20 MED ORDER — DIPHENHYDRAMINE HCL 50 MG/ML IJ SOLN: 12.5000 mg | Freq: Four times a day (QID) | INTRAMUSCULAR | Status: DC | PRN

## 2020-03-20 MED ORDER — MIDAZOLAM HCL 2 MG/2ML IJ SOLN
INTRAMUSCULAR | Status: AC
  Filled 2020-03-20: qty 2

## 2020-03-20 MED ORDER — BUPIVACAINE LIPOSOME 1.3 % IJ SUSP
INTRAMUSCULAR | Status: DC | PRN
  Administered 2020-03-20: 20 mL

## 2020-03-20 MED ORDER — BUPIVACAINE HCL (PF) 0.25 % IJ SOLN
INTRAMUSCULAR | Status: DC | PRN
  Administered 2020-03-20: 30 mL

## 2020-03-20 MED ORDER — ALVIMOPAN 12 MG PO CAPS
12.0000 mg | ORAL_CAPSULE | ORAL | Status: AC
  Administered 2020-03-20: 12 mg via ORAL
  Filled 2020-03-20: qty 1

## 2020-03-20 MED ORDER — HEPARIN SODIUM (PORCINE) 5000 UNIT/ML IJ SOLN
5000.0000 [IU] | Freq: Three times a day (TID) | INTRAMUSCULAR | Status: DC
  Administered 2020-03-20 – 2020-03-22 (×5): 5000 [IU] via SUBCUTANEOUS
  Filled 2020-03-20 (×5): qty 1

## 2020-03-20 MED ORDER — ROCURONIUM BROMIDE 10 MG/ML (PF) SYRINGE
PREFILLED_SYRINGE | INTRAVENOUS | Status: DC | PRN
  Administered 2020-03-20: 10 mg via INTRAVENOUS
  Administered 2020-03-20: 20 mg via INTRAVENOUS
  Administered 2020-03-20: 40 mg via INTRAVENOUS

## 2020-03-20 MED ORDER — KETAMINE HCL 10 MG/ML IJ SOLN
INTRAMUSCULAR | Status: DC | PRN
  Administered 2020-03-20 (×2): 20 mg via INTRAVENOUS

## 2020-03-20 MED ORDER — SODIUM CHLORIDE 0.9 % IV SOLN
2.0000 g | INTRAVENOUS | Status: AC
  Administered 2020-03-20: 2 g via INTRAVENOUS
  Filled 2020-03-20: qty 2

## 2020-03-20 MED ORDER — ONDANSETRON HCL 4 MG/2ML IJ SOLN
INTRAMUSCULAR | Status: DC | PRN
  Administered 2020-03-20: 4 mg via INTRAVENOUS

## 2020-03-20 MED ORDER — HYDROMORPHONE HCL 1 MG/ML IJ SOLN: 0.2500 mg | INTRAMUSCULAR | Status: DC | PRN

## 2020-03-20 MED ORDER — TRAMADOL HCL 50 MG PO TABS
50.0000 mg | ORAL_TABLET | Freq: Four times a day (QID) | ORAL | Status: DC | PRN
  Administered 2020-03-20: 50 mg via ORAL
  Filled 2020-03-20 (×2): qty 1

## 2020-03-20 MED ORDER — ONDANSETRON HCL 4 MG PO TABS: 4.0000 mg | ORAL_TABLET | Freq: Four times a day (QID) | ORAL | Status: DC | PRN

## 2020-03-20 MED ORDER — PROMETHAZINE HCL 25 MG/ML IJ SOLN: 6.2500 mg | INTRAMUSCULAR | Status: DC | PRN

## 2020-03-20 MED ORDER — KETOROLAC TROMETHAMINE 30 MG/ML IJ SOLN: 30.0000 mg | Freq: Once | INTRAMUSCULAR | Status: DC | PRN

## 2020-03-20 MED ORDER — ACETAMINOPHEN 500 MG PO TABS
1000.0000 mg | ORAL_TABLET | ORAL | Status: AC
  Administered 2020-03-20: 1000 mg via ORAL
  Filled 2020-03-20: qty 2

## 2020-03-20 MED ORDER — PROPOFOL 10 MG/ML IV BOLUS
INTRAVENOUS | Status: DC | PRN
  Administered 2020-03-20: 200 mg via INTRAVENOUS

## 2020-03-20 MED ORDER — KETAMINE HCL 10 MG/ML IJ SOLN
INTRAMUSCULAR | Status: AC
  Filled 2020-03-20: qty 1

## 2020-03-20 MED ORDER — FENTANYL CITRATE (PF) 250 MCG/5ML IJ SOLN
INTRAMUSCULAR | Status: DC | PRN
  Administered 2020-03-20: 100 ug via INTRAVENOUS
  Administered 2020-03-20 (×5): 50 ug via INTRAVENOUS

## 2020-03-20 MED ORDER — PHENYLEPHRINE HCL-NACL 10-0.9 MG/250ML-% IV SOLN
INTRAVENOUS | Status: DC | PRN
  Administered 2020-03-20: 15 ug/min via INTRAVENOUS

## 2020-03-20 SURGICAL SUPPLY — 52 items
BLADE HEX COATED 2.75 (ELECTRODE) ×3 IMPLANT
BNDG GAUZE ELAST 4 BULKY (GAUZE/BANDAGES/DRESSINGS) ×3 IMPLANT
CHLORAPREP W/TINT 26 (MISCELLANEOUS) ×3 IMPLANT
COVER MAYO STAND STRL (DRAPES) ×3 IMPLANT
COVER WAND RF STERILE (DRAPES) IMPLANT
DRAPE LAPAROSCOPIC ABDOMINAL (DRAPES) ×3 IMPLANT
DRAPE UTILITY XL STRL (DRAPES) IMPLANT
DRAPE WARM FLUID 44X44 (DRAPES) ×3 IMPLANT
DRSG OPSITE POSTOP 4X10 (GAUZE/BANDAGES/DRESSINGS) IMPLANT
DRSG OPSITE POSTOP 4X6 (GAUZE/BANDAGES/DRESSINGS) IMPLANT
DRSG OPSITE POSTOP 4X8 (GAUZE/BANDAGES/DRESSINGS) IMPLANT
DRSG PAD ABDOMINAL 8X10 ST (GAUZE/BANDAGES/DRESSINGS) ×3 IMPLANT
DRSG TELFA 3X8 NADH (GAUZE/BANDAGES/DRESSINGS) IMPLANT
ELECT REM PT RETURN 15FT ADLT (MISCELLANEOUS) ×3 IMPLANT
GAUZE SPONGE 4X4 12PLY STRL (GAUZE/BANDAGES/DRESSINGS) ×3 IMPLANT
GLOVE BIOGEL PI IND STRL 7.0 (GLOVE) ×1 IMPLANT
GLOVE BIOGEL PI INDICATOR 7.0 (GLOVE) ×2
GLOVE SURG SS PI 7.0 STRL IVOR (GLOVE) ×3 IMPLANT
GOWN STRL REUS W/TWL LRG LVL3 (GOWN DISPOSABLE) ×3 IMPLANT
GOWN STRL REUS W/TWL XL LVL3 (GOWN DISPOSABLE) IMPLANT
HANDLE SUCTION POOLE (INSTRUMENTS) ×1 IMPLANT
HOLDER FOLEY CATH W/STRAP (MISCELLANEOUS) IMPLANT
KIT BASIN (CUSTOM PROCEDURE TRAY) ×3 IMPLANT
KIT TURNOVER KIT A (KITS) IMPLANT
LIGASURE IMPACT 36 18CM CVD LR (INSTRUMENTS) ×3 IMPLANT
MANIFOLD NEPTUNE II (INSTRUMENTS) ×3 IMPLANT
PACK GENERAL/GYN (CUSTOM PROCEDURE TRAY) ×3 IMPLANT
PENCIL SMOKE EVACUATOR (MISCELLANEOUS) IMPLANT
RELOAD PROXIMATE 75MM BLUE (ENDOMECHANICALS) ×6 IMPLANT
SPONGE LAP 18X18 RF (DISPOSABLE) IMPLANT
STAPLER GUN LINEAR PROX 60 (STAPLE) ×3 IMPLANT
STAPLER PROXIMATE 75MM BLUE (STAPLE) ×3 IMPLANT
SUCTION POOLE HANDLE (INSTRUMENTS) ×3
SUT MNCRL AB 4-0 PS2 18 (SUTURE) ×3 IMPLANT
SUT NOVA NAB DX-16 0-1 5-0 T12 (SUTURE) IMPLANT
SUT NOVA NAB GS-21 0 18 T12 DT (SUTURE) ×6 IMPLANT
SUT PDS AB 0 CT1 36 (SUTURE) ×6 IMPLANT
SUT PROLENE 2 0 BLUE (SUTURE) IMPLANT
SUT SILK 2 0 (SUTURE) ×3
SUT SILK 2 0 SH CR/8 (SUTURE) ×3 IMPLANT
SUT SILK 2-0 18XBRD TIE 12 (SUTURE) ×1 IMPLANT
SUT SILK 3 0 (SUTURE) ×3
SUT SILK 3 0 SH CR/8 (SUTURE) ×3 IMPLANT
SUT SILK 3-0 18XBRD TIE 12 (SUTURE) ×1 IMPLANT
SUT VIC AB 2-0 SH 18 (SUTURE) IMPLANT
SUT VIC AB 2-0 SH 27 (SUTURE) ×3
SUT VIC AB 2-0 SH 27X BRD (SUTURE) ×1 IMPLANT
SUT VICRYL 4-0 PS2 18IN ABS (SUTURE) ×3 IMPLANT
TAPE CLOTH SURG 6X10 WHT LF (GAUZE/BANDAGES/DRESSINGS) ×3 IMPLANT
TOWEL OR 17X26 10 PK STRL BLUE (TOWEL DISPOSABLE) ×6 IMPLANT
TOWEL OR NON WOVEN STRL DISP B (DISPOSABLE) ×6 IMPLANT
YANKAUER SUCT BULB TIP NO VENT (SUCTIONS) ×3 IMPLANT

## 2020-03-20 NOTE — Transfer of Care (Signed)
Immediate Anesthesia Transfer of Care Note  Patient: Craig [name]  Procedure(s) Performed: ILEOSTOMY TAKEDOWN (N/A )  Patient Location: PACU  Anesthesia Type:General  Level of Consciousness: sedated  Airway & Oxygen Therapy: Patient Spontanous Breathing and Patient connected to face mask oxygen  Post-op Assessment: Report given to RN and Post -op Vital signs reviewed and stable  Post vital signs: Reviewed and stable  Last Vitals:  Vitals Value Taken Time  BP 157/107 03/20/20 1600  Temp    Pulse 90 03/20/20 1601  Resp 20 03/20/20 1601  SpO2 97 % 03/20/20 1601  Vitals shown include unvalidated device data.  Last Pain:  Vitals:   03/20/20 1305  TempSrc: Oral         Complications: No apparent anesthesia complications

## 2020-03-20 NOTE — Anesthesia Preprocedure Evaluation (Signed)
Anesthesia Evaluation  Patient identified by MRN, date of birth, ID band Patient awake    Reviewed: Allergy & Precautions, NPO status , Patient's Chart, lab work & pertinent test results  Airway Mallampati: I       Dental no notable dental hx. (+) Teeth Intact   Pulmonary former smoker,    Pulmonary exam normal breath sounds clear to auscultation       Cardiovascular Normal cardiovascular exam Rhythm:Regular     Neuro/Psych negative neurological ROS     GI/Hepatic Neg liver ROS,   Endo/Other  negative endocrine ROS  Renal/GU      Musculoskeletal negative musculoskeletal ROS (+)   Abdominal (+) + obese,   Peds  Hematology negative hematology ROS (+)   Anesthesia Other Findings   Reproductive/Obstetrics                             Anesthesia Physical Anesthesia Plan  ASA: II  Anesthesia Plan: General   Post-op Pain Management:    Induction: Intravenous  PONV Risk Score and Plan: 3 and Ondansetron, Dexamethasone and Midazolam  Airway Management Planned: Oral ETT  Additional Equipment: None  Intra-op Plan:   Post-operative Plan: Extubation in OR  Informed Consent: I have reviewed the patients History and Physical, chart, labs and discussed the procedure including the risks, benefits and alternatives for the proposed anesthesia with the patient or authorized representative who has indicated his/her understanding and acceptance.     Dental advisory given  Plan Discussed with: CRNA  Anesthesia Plan Comments:         Anesthesia Quick Evaluation

## 2020-03-20 NOTE — Anesthesia Procedure Notes (Signed)
Date/Time: 03/20/2020 3:54 PM Performed by: Cynda Familia, CRNA Oxygen Delivery Method: Simple face mask Placement Confirmation: breath sounds checked- equal and bilateral and positive ETCO2 Dental Injury: Teeth and Oropharynx as per pre-operative assessment

## 2020-03-20 NOTE — Anesthesia Postprocedure Evaluation (Signed)
Anesthesia Post Note  Patient: Craig [name]  Procedure(s) Performed: ILEOSTOMY TAKEDOWN (N/A )     Patient location during evaluation: PACU Anesthesia Type: General Level of consciousness: awake Pain management: pain level controlled Vital Signs Assessment: post-procedure vital signs reviewed and stable Respiratory status: spontaneous breathing Cardiovascular status: stable Postop Assessment: no apparent nausea or vomiting Anesthetic complications: no    Last Vitals:  Vitals:   03/20/20 1615 03/20/20 1630  BP: (!) 152/92 (!) 145/102  Pulse: 88 85  Resp: 13 13  Temp:    SpO2: 100% 98%    Last Pain:  Vitals:   03/20/20 1630  TempSrc:   PainSc: 0-No pain   Pain Goal:                   Huston Foley

## 2020-03-20 NOTE — H&P (Signed)
CC: Here today for surgery  HPI: Craig [name] is a very pleasant 59 year old male referred to me by Dr. Laural Golden for evaluation of rectal cancer and we initially met him 09/2017. He underwent his first ever colonoscopy 09/08/17. Findings were remarkable for a partially obstructing tumor in the rectum approximate 5 cm needle verge by endoscopic evaluation. This is biopsied and returned invasive adenocarcinoma. The rest of his endoscopic exam was unremarkable.  He subsequently underwent staging CT chest/abdomen/pelvis which was notable for a bulky rectal mass with perirectal nodal metastases, nonspecific right upper lobe nodule but no other evidence of distant metastatic disease.  MRI Pelvis 10/07/17 - rectal mass with shortest distance from tumor to anal sphincter being 5 cm; tumor extending on the left and extending superiorly along the course of the IMV. One enlarged pathologic appearing 42m perirectal LN. On imaging this was read as a T3N1 lesion.  He subsequently underwent neoadjuvant chemoradiation with Xeloda under the care of Dr. SBenay Spice- which he completed 11/23/17. He tolerated this therapy well and noted substantial improvement in his symptoms throughout his treatment course. He no longer had partial obstructive symptoms, was tolerating a diet without nausea or vomiting and had noted substantially improved amounts of bleeding with bowel movements. The pelvic pain and experienced has also resolved.  He went to the OR 02/01/18 for laparoscopic ultra-low anterior resection, double stapled coloanal anastomosis, takedown of splenic flexure, flex sig and diverting loop ileostomy + Cysto/stents by urology. Postoperatively he developed a leak from his coloanal anastomosis that was initially controlled with the surgical drain and IV antibiotics. He also had some smaller fluid collections near the small bowel the right abdomen that were not amenable to percutaneous drainage and were managed with IV  antibiotics. The collection in the pelvis persisted on follow-up imaging and IR was consult for placement of percutaneous drain which they did through the left gluteal approach. He did well from this. Throughout all of this, he was tolerating a diet and had no symptoms. His ileostomy output was managed on Imodium. He is also Discharge hospital with a course of oral antibiotics-Cipro/Flagyl.   Path was reviewed with the patient - ISOLATED FOCI OF RESIDUAL ADENOCARCINOMA INVOLVING RECTOSIGMOID COLON. SEE NOTE. - TUMOR INVADES INTO PERICOLONIC SOFT TISSUE. - LARGEST FOCUS OF CARCINOMA MEASURES 0.2 CM. - NEAR COMPLETE TREATMENT RESPONSE (TUMOR REGRESSION SCORE 1). - ALL MARGINS ARE NEGATIVE FOR CARCINOMA. - NEGATIVE FOR LYMPHOVASCULAR OR PERINEURAL INVASION. - SIX LYMPH NODES, NEGATIVE FOR CARCINOMA (0/6). SEE NOTE - SEPARATE TUBULAR ADENOMA WITHOUT HIGH GRADE DYSPLASIA OR MALIGNANCY. Donuts negative. ypT3N0 (0/6)M0.  Repeat CT scan was obtained 02/24/18 - which demonstrated near complete resolution of the presacral fluid collection and near complete resolution of the fluid collection in the right lower quadrant and suprapubic abdominal wall. He continued his course of Cipro/Flagyl which he completed.  CT C/A/P 08/21/18 - no evidence of recurrence or metastatic disease  Completed adjuvant therapy with Xeloda 07/2018. GGE 09/04/18 = demonstrated a small presacral cavity communicating with the distal colorectal anastomosis in the region of the previous drain. The exam was otherwise unremarkable. Flexible sigmoidoscopy by myself 09/13/18 which demonstrated a patent and normal appearing colorectal anastomosis with the exception of some granulation tissue which was heaping up in the posterior midline. This is at the location of his sinus tract seen on his GGE.  Colonoscopy with Dr. RLaural Golden1/30/2020 which demonstrated some stool in the cecum. A patent end-to-end anastomosis was identified  with single staple and scar but  no ulceration or granulation tissue. He underwent repeat GGE 01/02/2019 which demonstrated no persistent leakage of contrast in the rectal area. Postradiation changes seen. F/u flex sig by myself 05/2019 showed patent anastomosis without evident sinus or granulation tissue  He has been doing well. He denies new complaints. He denies any drainage per rectum anymore. He is tolerating a diet. His ileostomy is working well.  In preparation for ileostomy takedown, he underwent surveillance CT chest/abdomen/pelvis11/01/2019. This unfortunately demonstrated a new 1.2 cm pulmonary nodule in the left upper lobe. This is new relative to his CT prior CTs. Normal-appearing anastomosis at the rectum. No other concerning findings. PET scan demonstrated mild activity at this location but no other concerning areas. He was seen by Dr. Servando Snare with cardiothoracic surgery and underwent wedge resection of the mass in his left chest. Pathology returned metastatic adenocarcinoma. Margins were negative. He has had a follow-up chest x-ray which demonstrates no concerning features. He returns today for follow-up and discussion of possible stoma reversal. He has been doing well. He denies any complaints today. His weight is stable. His ostomy is working well. He has request reversal. He denies any blood per rectum or abdominal pain.  He denies any changes in his health or health history since we met in the office  Past Medical History:  Diagnosis Date  . Anxiety    Truck driver  . Dysrhythmia    palpitations  . Elevated serum creatinine    1.39 in 06/2012  . Family history of cancer   . GERD (gastroesophageal reflux disease)   . History of kidney stones   . Ileostomy in place [facility])   . Palpitations    + chest pressure; PVCs  . Rectal bleeding 01/07/2017  . Rectal cancer ([place]) 09/30/2017   METASIZED TO LUNGS AND LYMPH NODES    Past Surgical History:  Procedure  Laterality Date  . COLONOSCOPY N/A 09/08/2017   Procedure: COLONOSCOPY;  Surgeon: Rogene Houston, MD;  [facility];  Service: Endoscopy;  Laterality: N/A;  . COLONOSCOPY N/A 12/14/2018   Procedure: COLONOSCOPY;  Surgeon: Rogene Houston, MD;  Location: [facility];  Service: Endoscopy;  Laterality: N/A;  830  . CYSTOSCOPY WITH RETROGRADE PYELOGRAM, URETEROSCOPY AND STENT PLACEMENT Bilateral 02/01/2018   Procedure: CYSTOSCOPY WITH RETROGRADE PYELOGRAM, URETEROSCOPY AND URETERAL CATHETERS BILATERAL;  Surgeon: Alexis Frock, MD;  Location: [facility];  Service: Urology;  Laterality: Bilateral;  . FLEXIBLE SIGMOIDOSCOPY N/A 09/13/2018   Procedure: FLEXIBLE SIGMOIDOSCOPY;  Surgeon: Ileana Roup, MD;  [facility];  Service: General;  Laterality: N/A;  . FLEXIBLE SIGMOIDOSCOPY N/A 05/30/2019   Procedure: Beryle Quant;  Surgeon: Ileana Roup, MD;  Location: [facility];  Service: General;  Laterality: N/A;  . LAPAROSCOPIC LOW ANTERIOR RESECTION N/A 02/01/2018   Procedure: LAPAROSCOPIC  LOW ANTERIOR RECTOSIGMOID RESECTION, COLOANAL ANASTOMOSIS;  DIVERTING LOOP ILESTOMY; FLEXIBLE SIGMOIDOSCOPY;  Surgeon: Ileana Roup, MD;  Location: [facility];  Service: General;  Laterality: N/A;  . VIDEO ASSISTED THORACOSCOPY (VATS)/WEDGE RESECTION Left 10/22/2019   Procedure: VIDEO ASSISTED THORACOSCOPY (VATS) with mini thoracotomy/Left Upper Lobe Wedge RESECTION x2, left pleural biopsy, lymph node sampling, intercoastal nerve block;  Surgeon: Grace Isaac, MD;  Location: [facility];  Service: Thoracic;  Laterality: Left;  Marland Kitchen VIDEO BRONCHOSCOPY N/A 10/22/2019   Procedure: VIDEO BRONCHOSCOPY using anesthesia scope;  Surgeon: Grace Isaac, MD;  Location: [facility];  Service: Thoracic;  Laterality: N/A;    Family History  Problem Relation Age of Onset  .  Cancer Mother 15       Lung cancer  . Leukemia Sister 24  . Cancer Maternal Aunt     Social:  reports that he  has quit smoking. He has never used smokeless tobacco. He reports previous alcohol use. He reports that he does not use drugs.  Allergies: No Known Allergies  Medications: I have reviewed the patient's current medications.  No results found for this or any previous visit (from the past 48 hour(s)).  No results found.  ROS - all of the below systems have been reviewed with the patient and positives are indicated with bold text General: chills, fever or night sweats Eyes: blurry vision or double vision ENT: epistaxis or sore throat Allergy/Immunology: itchy/watery eyes or nasal congestion Hematologic/Lymphatic: bleeding problems, blood clots or swollen lymph nodes Endocrine: temperature intolerance or unexpected weight changes Breast: new or changing breast lumps or nipple discharge Resp: cough, shortness of breath, or wheezing CV: chest pain or dyspnea on exertion GI: as per HPI GU: dysuria, trouble voiding, or hematuria MSK: joint pain or joint stiffness Neuro: TIA or stroke symptoms Derm: pruritus and skin lesion changes Psych: anxiety and depression  PE There were no vitals taken for this visit. Constitutional: NAD; conversant Eyes: Moist conjunctiva; no lid lag; anicteric Lungs: Normal respiratory effort CV: RRR GI: Abd soft, NT/ND MSK: Normal range of motion of extremities; no clubbing/cyanosis Psychiatric: Appropriate affect; alert and oriented x3  No results found for this or any previous visit (from the past 48 hour(s)).  No results found.   A/P: 40M with low rectal cancer now s/p neoadjuvant chemoXRT with Xeloda and surgery 3/20 - lap ultralow AR with DLI and double stapled coloanal anastomosis - 1.5cm distal margin but this was below the mesorectal margin (bare area) - subsequent anastomotic leak controlled with ileostomy and drains, resolved clinically and they were ultimately removed. Path ypT3N0 (0/6) M0; complete TME with uninvolved CRM. Completed adjuvant  Xeloda 07/18/18 CT C/A/P 08/21/18 - no evidence of recurrence/metastatic disease GGE 09/04/18 - small presacral sinus at site of prior leak Flex sig 09/13/18 - granulation tissue posterior midline at anastomosis GGE 01/02/2019 - no evidence of persistent leak/sinus Flex sig 05/2019 - no granulation tissue or evidence of sinus - appears to have healed  Surveillance CT C/A/P 09/18/2019 - new 1.2 cm pulmonary nodule in LUL - wedge resection Dr. Servando Snare 10/2019 - metastatic adenoCA, margins clear  -The anatomy and physiology of the GI tract was discussed at length with the patient and in particular how it pertains to his current anatomy with diverting loop ileostomy -He has requested ileostomy reversal. We discussed takedown of loop ileostomy. -The planned procedure, material risks (including, but not limited to, pain, bleeding, infection, scarring, need for blood transfusion, damage to surrounding structures- blood vessels/nerves/viscus/organs, leak from anastomosis, need for additional procedures, worsening of pre-existing medical conditions, need for subsequent stoma which may be permanent, hernia, recurrence, DVT/PE, pneumonia, heart attack, stroke, death) benefits and alternatives to surgery were discussed at length. The patient's questions were answered to his satisfaction, he voiced understanding and elected to proceed with surgery. Additionally, we discussed typical postoperative expectations and the recovery process.  Sharon Mt. Dema Severin, M.D. [facility] Use [email or web address] to contact on call provider

## 2020-03-20 NOTE — Op Note (Signed)
03/20/2020  3:41 PM  PATIENT:  Craig [name]  59 y.o. male  Patient Care Team: Patient, No Pcp Per as PCP - General ([place]) Rothbart, Cristopher Estimable, MD (Cardiology) Ladell Pier, MD as Consulting Physician (Oncology)  PRE-OPERATIVE DIAGNOSIS: 1. Ileostomy status 2. History of rectal cancer  POST-OPERATIVE DIAGNOSIS:  Same  PROCEDURE:  Takedown of diverting loop ileostomy  SURGEON:  Sharon Mt. Dema Severin, MD  ASSISTANT: Coralie Keens, MD  ANESTHESIA:   local and general  COUNTS:  Sponge, needle and instrument counts were reported correct x2 at the conclusion of the operation.  EBL: 50 mL  DRAINS: None  SPECIMEN: Ileostomy trim  COMPLICATIONS: None  FINDINGS: Loop ileostomy taken down, ends removed and a side to side functional end to end stapled anastomosis fashioned. Fascia closed, skin tightened with purse strink  DISPOSITION: PACU in satisfactory condition  INDICATION: Craig [name] is a very pleasant 39yoM with hx of rectal cancer and we initially met in 09/2017. He underwent his first ever colonoscopy 09/08/17. Findings were remarkable for a partially obstructing tumor in the rectum approximate 5 cm anal verge by endoscopic evaluation. He was staged as T3N1 by MRI and underwent neoadjuvant chemoXRT. Ultimately laparoscopic ultra-low anterior resection with double stapled colorectal anastomosis at 1-2 cm from verge; diverting loop ileostomy. Postoperatively he likely had a small posterior leak from his anastomosis managed with antibiotics and an additional drain placement. He recovered and this healed. Path returned ypT3N0. Adjuvant Xeloda was given. Ultimately, gastrograffin enema showed small presacral cavity 08/2018 but over time this appeared to have closed on imaging. Endoscopic appearance suggested this as well.  In preparation for ileostomy takedown, he underwent surveillance CT chest/abdomen/pelvis 09/18/2019. This unfortunately demonstrated a new 1.2 cm  pulmonary nodule in the left upper lobe. This is new relative to his CT prior CTs. Normal-appearing anastomosis at the rectum. No other concerning findings. PET scan demonstrated mild activity at this location but no other concerning areas. He was seen by Dr. Servando Snare with cardiothoracic surgery and underwent wedge resection of the mass in his left chest. Pathology returned metastatic adenocarcinoma. Margins were negative. He has had a follow-up chest x-ray which demonstrates no concerning features. We met in the office and reviewed everything. He requested ileostomy closure. Please refer to notes elsewhere for details regarding this discussion.  DESCRIPTION: The patient was identified in preop holding and taken to the OR where he was placed on the operating room table. SCDs were placed. General endotracheal anesthesia was induced without difficulty. Pressure points were evaluated and padded. A foley catheter was placed by nursing under sterile conditions. A suture was placed to close the lumen of the ileostomy after removing the appliance. He was then prepped and draped in the usual sterile fashion. A surgical timeout was performed indicating the correct patient, procedure, positioning and need for preoperative antibiotics.   The loop ileostomy was circumferentially incised sharply. Subcutaneous tissue was carefully dissected away from the limbs of the ileostomy sharply with scissors. The fascia and rectus muscle was identified and retracted. Adhesions to the ileum were lysed. The peritoneum was incised sharply and the peritoneal cavity entered. The ileostomy was freed circumferentially. There were adhesions deeper in the right lower quadrant but well away from each respective limb of the stoma. The ileostomy reached nicely out of the wound. Windows were created in the mesentery along each limb just below the ileostomy. GIA blue load was used to divide each limb. Babcocks were then placed on each limb  to maintain orientation. The mesentery was divided with the Ligasure. Hemostasis was noted. An oozing vessel from the ileal staple line was controlled with 3-0 silk U-stitches. The limbs were inspected for any serosal tears or injuries and none are present. The limbs were inspected for orientation to ensure there was no twisting and the mesentery laid medially. The limbs were aligned and orientation confirmed. Each antimesenteric corner of the staple lines was incised. A 75 mm GIA blue load stapler was passed and the anastomosis created after ensuring mesentery was medial and removed from the anastomosis. The stapler was closed, held and fired. The anastomosis was inspected and hemostatic. The common enterotomy was then closed with a TA 60 mm blue load stapler. The corners of this staple line were dunked with 3-0 silk suture. A crotch stitch was placed at the apex of the anastomosis. The mesenteric defect which was small was closed with a 3-0 silk suture. The bowel was pink. This was then placed back into the peritoneal cavity and laid nicely. The fascial edges were cleared from overlying subcutaneous tissue. No adhesions were present under the fascia at this level. The fascia was closed with running #1 PDS sutures. Local anesthetic consisting of a mixture of Exparel + 0.25% marcaine with epi was infiltrated into the fascia and subcutaneous tissue. Hemostasis noted. Sponge, needle and instrument counts were reported correct x2. The skin was then tightened down with a 2-0 vicryl purse string. A dressing consisting of a moist Kerlex was placed in the closure site, covered in 4x4s and ABD.  He was then awakened from anesthesia, extubated and transferred to a stretcher for transport to PACU in satisfactory condition.

## 2020-03-20 NOTE — Anesthesia Procedure Notes (Signed)
Procedure Name: Intubation Date/Time: 03/20/2020 1:51 PM Performed by: Cynda Familia, CRNA Pre-anesthesia Checklist: Patient identified, Emergency Drugs available, Suction available and Patient being monitored Patient Re-evaluated:Patient Re-evaluated prior to induction Oxygen Delivery Method: Circle System Utilized Preoxygenation: Pre-oxygenation with 100% oxygen Induction Type: IV induction and Cricoid Pressure applied Ventilation: Mask ventilation without difficulty Laryngoscope Size: Miller and 2 Grade View: Grade I Tube type: Oral Tube size: 7.0 mm Number of attempts: 1 Airway Equipment and Method: Stylet Placement Confirmation: ETT inserted through vocal cords under direct vision,  positive ETCO2 and breath sounds checked- equal and bilateral Secured at: 22 cm Tube secured with: Tape Dental Injury: Teeth and Oropharynx as per pre-operative assessment  Comments: Smooth RSI Hatchette-- intubation AM CRNA-- atraumatic- teeth and mouth as preop-- pt reported chipping left front tooth and personally applied temp bonding to right front -- unchanged with intubation-- bilat BS Hatchette

## 2020-03-21 LAB — CBC
HCT: 46.4 % (ref 39.0–52.0)
Hemoglobin: 15.5 g/dL (ref 13.0–17.0)
MCH: 30.3 pg (ref 26.0–34.0)
MCHC: 33.4 g/dL (ref 30.0–36.0)
MCV: 90.8 fL (ref 80.0–100.0)
Platelets: 244 10*3/uL (ref 150–400)
RBC: 5.11 MIL/uL (ref 4.22–5.81)
RDW: 13.8 % (ref 11.5–15.5)
WBC: 9.9 10*3/uL (ref 4.0–10.5)
nRBC: 0 % (ref 0.0–0.2)

## 2020-03-21 LAB — BASIC METABOLIC PANEL
Anion gap: 11 (ref 5–15)
BUN: 16 mg/dL (ref 6–20)
CO2: 19 mmol/L — ABNORMAL LOW (ref 22–32)
Calcium: 8.9 mg/dL (ref 8.9–10.3)
Chloride: 107 mmol/L (ref 98–111)
Creatinine, Ser: 1.33 mg/dL — ABNORMAL HIGH (ref 0.61–1.24)
GFR calc Af Amer: >60 mL/min (ref >60)
GFR calc non Af Amer: 59 mL/min — ABNORMAL LOW (ref >60)
Glucose, Bld: 134 mg/dL — ABNORMAL HIGH (ref 70–99)
Potassium: 4.3 mmol/L (ref 3.5–5.1)
Sodium: 137 mmol/L (ref 135–145)

## 2020-03-21 LAB — GLUCOSE, CAPILLARY
Glucose-Capillary: 111 mg/dL — ABNORMAL HIGH (ref 70–99)
Glucose-Capillary: 135 mg/dL — ABNORMAL HIGH (ref 70–99)
Glucose-Capillary: 102 mg/dL — ABNORMAL HIGH (ref 70–99)
Glucose-Capillary: 109 mg/dL — ABNORMAL HIGH (ref 70–99)

## 2020-03-21 MED ORDER — INSULIN ASPART 100 UNIT/ML ~~LOC~~ SOLN: 0.0000 [IU] | Freq: Every day | SUBCUTANEOUS | Status: DC

## 2020-03-21 MED ORDER — INSULIN ASPART 100 UNIT/ML ~~LOC~~ SOLN
0.0000 [IU] | Freq: Three times a day (TID) | SUBCUTANEOUS | Status: DC
  Administered 2020-03-21: 1 [IU] via SUBCUTANEOUS

## 2020-03-21 MED ORDER — TRAMADOL HCL 50 MG PO TABS: 50.0000 mg | ORAL_TABLET | Freq: Four times a day (QID) | ORAL | 0 refills | Status: AC | PRN

## 2020-03-21 NOTE — Progress Notes (Signed)
Subjective No acute events. Feeling well. Pain well controlled. Ambulating on his own. Foley removed this morning. Tolerating liquids without nausea or vomiting. Smear on bed sheets, denies flatus/bm as of yet  Objective: Vital signs in last 24 hours: Temp:  [97.5 F (36.4 C)-98.7 F (37.1 C)] 98.2 F (36.8 C) (05/07 0615) Pulse Rate:  [74-88] 82 (05/07 0615) Resp:  [10-21] 18 (05/07 0615) BP: (132-157)/(86-107) 141/92 (05/07 0615) SpO2:  [93 %-100 %] 93 % (05/07 0615) Last BM Date: 03/19/20  Intake/Output from previous day: 05/06 0701 - 05/07 0700 In: 3086.3 [P.O.:840; I.V.:2146.3; IV Piggyback:100] Out: 1505 [Urine:1775; Blood:50] Intake/Output this shift: No intake/output data recorded.  Gen: NAD, comfortable CV: RRR Pulm: Normal work of breathing Abd: Soft, minimally ttp along ostomy takedown site; packing removed. Nondistended. No rebound/guarding Ext: SCDs in place  Lab Results: CBC  Recent Labs    03/21/20 0419  WBC 9.9  HGB 15.5  HCT 46.4  PLT 244   BMET Recent Labs    03/21/20 0419  NA 137  K 4.3  CL 107  CO2 19*  GLUCOSE 134*  BUN 16  CREATININE 1.33*  CALCIUM 8.9   PT/INR No results for input(s): LABPROT, INR in the last 72 hours. ABG No results for input(s): PHART, HCO3 in the last 72 hours.  Invalid input(s): PCO2, PO2  Studies/Results:  Anti-infectives: Anti-infectives (From admission, onward)   Start     Dose/Rate Route Frequency Ordered Stop   03/20/20 1315  cefoTEtan (CEFOTAN) 2 g in sodium chloride 0.9 % 100 mL IVPB     2 g 200 mL/hr over 30 Minutes Intravenous On call to O.R. 03/20/20 1304 03/20/20 1422       Assessment/Plan: Patient Active Problem List   Diagnosis Date Noted  . S/P closure of ileostomy 03/20/2020  . Nodule of left lung 10/22/2019  . History of rectal cancer 10/05/2018  . Malnutrition of moderate degree 02/10/2018  . Family history of cancer   . Rectal cancer ([place]) 09/30/2017  . Rectal bleeding  01/07/2017  . Rectal mass 01/07/2017  . History of diagnostic tests 08/25/2012  . Palpitations   . Anxiety    s/p Procedure(s): ILEOSTOMY TAKEDOWN 03/20/2020  -Advance to full liquids - diabetic fulls - if tolerates, will give diabetic diet -Ambulate 5x/day -D/C IVF -Continue Entereg until return of bowel function -PPx: SCDs, SQH  LOS: 1 day   Sharon Mt. Dema Severin, M.D. [facility] Use [email or web address] to contact on call provider

## 2020-03-21 NOTE — Discharge Instructions (Signed)
POST OP INSTRUCTIONS AFTER COLON SURGERY  1. DIET: Be sure to include lots of fluids daily to stay hydrated - 64oz of water per day (8, 8 oz glasses).  Avoid fast food or heavy meals for the first couple of weeks as your are more likely to get nauseated. Avoid raw/uncooked fruits or vegetables for the first 4 weeks (its ok to have these if they are blended into smoothie form). If you have fruits/vegetables, make sure they are cooked until soft enough to mash on the roof of your mouth and chew your food well. Otherwise, diet as tolerated.  2. Take your usually prescribed home medications unless otherwise directed.  3. PAIN CONTROL: a. Pain is best controlled by a usual combination of three different methods TOGETHER: i. Ice/Heat ii. Over the counter pain medication iii. Prescription pain medication b. Most patients will experience some swelling and bruising around the surgical site.  Ice packs or heating pads (30-60 minutes up to 6 times a day) will help. Some people prefer to use ice alone, heat alone, alternating between ice & heat.  Experiment to what works for you.  Swelling and bruising can take several weeks to resolve.   c. It is helpful to take an over-the-counter pain medication regularly for the first few weeks: i. Acetaminophen (Tylenol) - you may take 650mg  every 6 hours as needed. You can take this with motrin as they act differently on the body. If you are taking a narcotic pain medication that has acetaminophen in it, do not take over the counter tylenol at the same time. d. A  prescription for pain medication should be given to you upon discharge.  Take your pain medication as prescribed if your pain is not adequatly controlled with the over-the-counter pain reliefs mentioned above.  4. Avoid getting constipated.  Between the surgery and the pain medications, it is common to experience some constipation.  Increasing fluid intake and taking a fiber supplement (such as Metamucil,  Citrucel, FiberCon, MiraLax, etc) 1-2 times a day regularly will usually help prevent this problem from occurring.  A mild laxative (prune juice, Milk of Magnesia, MiraLax, etc) should be taken according to package directions if there are no bowel movements after 48 hours.    5. Dressing: Cover your ileostomy reversal site with clean gauze daily to protect your clothing. You may bathe normally starting the day after your surgery in a shower. Avoid baths/pools/lakes/oceans until your wounds have fully healed.  6. ACTIVITIES as tolerated:   a. Avoid heavy lifting (>10lbs or 1 gallon of milk) for the next 6 weeks. b. You may resume regular daily activities as tolerated--such as daily self-care, walking, climbing stairs--gradually increasing activities as tolerated.  If you can walk 30 minutes without difficulty, it is safe to try more intense activity such as jogging, treadmill, bicycling, low-impact aerobics.  c. DO NOT PUSH THROUGH PAIN.  Let pain be your guide: If it hurts to do something, don't do it. d. Dennis Bast may drive when you are no longer taking prescription pain medication, you can comfortably wear a seatbelt, and you can safely maneuver your car and apply brakes.  7. FOLLOW UP in our office a. Please call [facility] at [phone] to set up an appointment to see your surgeon in the office for a follow-up appointment approximately 2-3 weeks after your surgery. b. Make sure that you call for this appointment the day you arrive home to insure a convenient appointment time.  9. If you have disability  or family leave forms that need to be completed, you may have them completed by your primary care physician's office; for return to work instructions, please ask our office staff and they will be happy to assist you in obtaining this documentation   When to call us [phone]: 1. Poor pain control 2. Reactions / problems with new medications (rash/itching, etc)  3. Fever over 101.5 F (38.5  C) 4. Inability to urinate 5. Nausea/vomiting 6. Worsening swelling or bruising 7. Continued bleeding from incision. 8. Increased pain, redness, or drainage from the incision  The clinic staff is available to answer your questions during regular business hours (8:30am-5pm).  Please don't hesitate to call and ask to speak to one of our nurses for clinical concerns.   A surgeon from [facility] is always on call at the hospitals   If you have a medical emergency, go to the nearest emergency room or call 911.  [facility], [place] [address] [place], [place], [place]  [address] MAIN: [phone] FAX: [phone] [email or web address]

## 2020-03-22 LAB — CBC
HCT: 46.6 % (ref 39.0–52.0)
Hemoglobin: 15.6 g/dL (ref 13.0–17.0)
MCH: 30.5 pg (ref 26.0–34.0)
MCHC: 33.5 g/dL (ref 30.0–36.0)
MCV: 91 fL (ref 80.0–100.0)
Platelets: 176 10*3/uL (ref 150–400)
RBC: 5.12 MIL/uL (ref 4.22–5.81)
RDW: 14.1 % (ref 11.5–15.5)
WBC: 6.7 10*3/uL (ref 4.0–10.5)
nRBC: 0 % (ref 0.0–0.2)

## 2020-03-22 LAB — GLUCOSE, CAPILLARY: Glucose-Capillary: 108 mg/dL — ABNORMAL HIGH (ref 70–99)

## 2020-03-22 NOTE — Progress Notes (Signed)
2 Days Post-Op   Subjective/Chief Complaint: No complaints. Passing flatus and having bm's   Objective: Vital signs in last 24 hours: Temp:  [98.1 F (36.7 C)-99.6 F (37.6 C)] 99 F (37.2 C) (05/08 0614) Pulse Rate:  [70-76] 70 (05/08 0614) Resp:  [18] 18 (05/08 0614) BP: (130-159)/(92-103) 136/99 (05/08 0614) SpO2:  [93 %-97 %] 94 % (05/08 0614) Weight:  [96.3 kg] 96.3 kg (05/07 0943) Last BM Date: 03/21/20  Intake/Output from previous day: 05/07 0701 - 05/08 0700 In: 1125 [P.O.:840; I.V.:285] Out: 0  Intake/Output this shift: No intake/output data recorded.  General appearance: alert and cooperative Resp: clear to auscultation bilaterally Cardio: regular rate and rhythm GI: soft, nontender. wound clean  Lab Results:  Recent Labs    03/21/20 0419 03/22/20 0455  WBC 9.9 6.7  HGB 15.5 15.6  HCT 46.4 46.6  PLT 244 176   BMET Recent Labs    03/21/20 0419  NA 137  K 4.3  CL 107  CO2 19*  GLUCOSE 134*  BUN 16  CREATININE 1.33*  CALCIUM 8.9   PT/INR No results for input(s): LABPROT, INR in the last 72 hours. ABG No results for input(s): PHART, HCO3 in the last 72 hours.  Invalid input(s): PCO2, PO2  Studies/Results: No results found.  Anti-infectives: Anti-infectives (From admission, onward)   Start     Dose/Rate Route Frequency Ordered Stop   03/20/20 1315  cefoTEtan (CEFOTAN) 2 g in sodium chloride 0.9 % 100 mL IVPB     2 g 200 mL/hr over 30 Minutes Intravenous On call to O.R. 03/20/20 1304 03/20/20 1422      Assessment/Plan: s/p Procedure(s): ILEOSTOMY TAKEDOWN (N/A) Advance diet Discharge  LOS: 2 days    Autumn Messing III 03/22/2020

## 2020-03-24 LAB — SURGICAL PATHOLOGY

## 2020-03-25 ENCOUNTER — Telehealth: Payer: Self-pay | Admitting: *Deleted

## 2020-03-25 ENCOUNTER — Inpatient Hospital Stay: Payer: 59 | Attending: Oncology | Admitting: Oncology

## 2020-03-25 NOTE — Telephone Encounter (Signed)
Patient has appointment next week with Dr. Dema Severin for surgical f/u. Requested they have Dr. Dema Severin stress to patient importance of f/u with Dr. Benay Spice. He has been a "no show" for last three appointments. She will forward this to Dr. Dema Severin.

## 2020-03-27 NOTE — Discharge Summary (Signed)
Patient ID: MAVRIC CORTRIGHT MRN: [id number] DOB/AGE: July 05, 1961 59 y.o.  Admit date: 03/20/2020 Discharge date: 03/27/2020  Discharge Diagnoses Patient Active Problem List   Diagnosis Date Noted  . S/P closure of ileostomy 03/20/2020  . Nodule of left lung 10/22/2019  . History of rectal cancer 10/05/2018  . Malnutrition of moderate degree 02/10/2018  . Family history of cancer   . Rectal cancer ([place]) 09/30/2017  . Rectal bleeding 01/07/2017  . Rectal mass 01/07/2017  . History of diagnostic tests 08/25/2012  . Palpitations   . Anxiety     Procedures Takedown of diverting loop ileostomy 03/20/20  Hospital Course: He was admitted to the hospital postoperatively where he recovered well. His diet was gradually advanced and he began having bowel function. He was mobilizing on his own, pain well controlled on oral analgesics, tolerating diet and continued to have bowel function. He was deemed stable for discharge home.  Allergies as of 03/22/2020   No Known Allergies     Medication List    STOP taking these medications   naproxen sodium 220 MG tablet Commonly known as: ALEVE     TAKE these medications   acetaminophen 500 MG tablet Commonly known as: TYLENOL Take 1,000 mg by mouth every 6 (six) hours as needed for moderate pain or headache.   aspirin-sod bicarb-citric acid 325 MG Tbef tablet Commonly known as: ALKA-SELTZER Take 650 mg by mouth every 6 (six) hours as needed (heartburn).   diphenhydrAMINE 25 MG tablet Commonly known as: SOMINEX Take 50 mg by mouth at bedtime as needed for sleep.   naphazoline-glycerin 0.012-0.2 % Soln Commonly known as: CLEAR EYES REDNESS Place 1-2 drops into both eyes 4 (four) times daily as needed for eye irritation.   oxymetazoline 0.05 % nasal spray Commonly known as: AFRIN Place 1 spray into both nostrils 2 (two) times daily as needed for congestion.     ASK your doctor about these medications   traMADol 50 MG tablet Commonly known  as: Ultram Take 1 tablet (50 mg total) by mouth every 6 (six) hours as needed for up to 5 days (severe postop pain not controlled with tylenol). Ask about: Should I take this medication?        Follow-up Information    Ileana Roup, MD Follow up in 3 week(s).   Specialty: General Surgery Contact information: [place] [phone]           Chesnie Capell M. Dema Severin, M.D. [facility]

## 2020-05-27 ENCOUNTER — Encounter: Payer: Self-pay | Admitting: *Deleted

## 2020-05-27 NOTE — Progress Notes (Signed)
Per Dr. Benay Spice: Needs OV in next 3-4 weeks. Scheduling message sent. Updated his phone contact per message from Dr. Dema Severin.

## 2020-05-28 ENCOUNTER — Telehealth: Payer: Self-pay | Admitting: Oncology

## 2020-05-28 NOTE — Telephone Encounter (Signed)
Scheduled appt per 7/13 sch msg - unable to reach pt - left message with appt date and time

## 2020-06-17 ENCOUNTER — Other Ambulatory Visit: Payer: Self-pay

## 2020-06-17 ENCOUNTER — Inpatient Hospital Stay: Payer: 59 | Attending: Oncology | Admitting: Oncology

## 2020-06-17 VITALS — BP 133/89 | HR 67 | Temp 97.7°F | Resp 19 | Ht 69.0 in | Wt 204.6 lb

## 2020-06-17 DIAGNOSIS — D709 Neutropenia, unspecified: Secondary | ICD-10-CM | POA: Diagnosis not present

## 2020-06-17 DIAGNOSIS — Z923 Personal history of irradiation: Secondary | ICD-10-CM | POA: Insufficient documentation

## 2020-06-17 DIAGNOSIS — C20 Malignant neoplasm of rectum: Secondary | ICD-10-CM | POA: Diagnosis not present

## 2020-06-17 DIAGNOSIS — R918 Other nonspecific abnormal finding of lung field: Secondary | ICD-10-CM | POA: Diagnosis not present

## 2020-06-17 NOTE — Progress Notes (Signed)
[facility] OFFICE PROGRESS NOTE   Diagnosis: Rectal cancer  INTERVAL HISTORY:   Mr. Kimmel feels well.  He underwent takedown of the diverting loop ileostomy on 03/20/2020.  The ileostomy wound continues to heal.  He has frequent bowel movements.  No new complaint.  Objective:  Vital signs in last 24 hours:  Blood pressure 133/89, pulse 67, temperature 97.7 F (36.5 C), temperature source Temporal, resp. rate 19, height 5\' 9"  (1.753 m), weight 204 lb 9.6 oz (92.8 kg), SpO2 98 %.     Lymphatics: No cervical, supraclavicular, or inguinal nodes.  Prominent bilateral axillary fat pads versus shotty lymph nodes Resp: Lungs clear bilaterally Cardio: Regular rate and rhythm GI: No hepatosplenomegaly, no mass, nontender, granulation tissue at the ileostomy site.  No evidence of infection Vascular: No leg edema Skin: Thoracotomy/chest tube scars without evidence of recurrent tumor  Lab Results:  Lab Results  Component Value Date   WBC 6.7 03/22/2020   HGB 15.6 03/22/2020   HCT 46.6 03/22/2020   MCV 91.0 03/22/2020   PLT 176 03/22/2020   NEUTROABS 2.5 03/11/2020    CMP  Lab Results  Component Value Date   NA 137 03/21/2020   K 4.3 03/21/2020   CL 107 03/21/2020   CO2 19 (L) 03/21/2020   GLUCOSE 134 (H) 03/21/2020   BUN 16 03/21/2020   CREATININE 1.33 (H) 03/21/2020   CALCIUM 8.9 03/21/2020   PROT 7.5 03/11/2020   ALBUMIN 3.7 03/11/2020   AST 28 03/11/2020   ALT 41 03/11/2020   ALKPHOS 114 03/11/2020   BILITOT 0.9 03/11/2020   GFRNONAA 59 (L) 03/21/2020   GFRAA >60 03/21/2020    Lab Results  Component Value Date   CEA1 7.76 (H) 09/24/2019     Medications: I have reviewed the patient's current medications.   Assessment/Plan: 1. Rectal cancer ? Colonoscopy 09/08/2017, rectal mass at 5 cm from the anal verge, biopsy confirmed invasive adenocarcinoma ? Staging CTs 09/15/2017, rectal mass and perirectal lymphadenopathy, indeterminate right upper lobe  nodule ? Elevated CEA ? MRI pelvis 10/07/2017-5.3 cm circumferential lower rectal mass with perirectal extension along the inferior mesenteric vein;T3, N1; distance from tumor to the anal sphincter is 5 cm. ? Initiation of radiation and concurrent Xeloda chemotherapy 10/12/2017;completion of radiation/Xeloda 11/23/2017. ? Surgery 02/01/18 per Dr. Nadeen Landau; laparoscopic lower anterior rectosigmoid resection, coloanal anastomosis, diverting loop ileostomy, flexible sigmoidoscopy; ypT3, pN0 ? Developed low-grade fever and abdominal pain on POD 4, CT AP on 02/06/18 and 02/09/18 shows small loculated fluid collections in RLQ and presacral space, consulted IR for transgluteal presacral abscess drain, treated with IV zosyn then transitioned to po cipro and flagyl ? Follow-up CT 03/24/2018-2.2 x 4.9 x 4.0 cm thick-walled presacral gas and fluid collection/abscess, minimally increased status post removal of surgical drain. No evidence of recurrent or metastatic disease. ? Cycle 1 adjuvant Xeloda 04/12/2018 ? Cycle 2 adjuvant Xeloda 05/03/2018 ? Cycle 3 adjuvant Xeloda 06/05/2018 (Xeloda dose reduced to 1500 mg twice daily secondary to mild neutropenia) ? Cycle 4adjuvant Xeloda 06/26/2018 ? Cycle 5 adjuvant Xeloda 07/18/2018 ? CTs 08/21/2018-no evidence of tumor recurrence or metastatic disease in the chest, abdomen or pelvis. Presacral soft tissue thickening and some calcifications compatible with postradiation changes. Stable probablybenign 3 mm right upper lobe nodule. ? CTs 09/18/2019-new 1.2 cm pulmonary nodule within the left upper lobe; stable 3 mm nodule right upper lobe. ? PET scan 10/03/2019-hypermetabolic left upper lobe nodule, no evidence of local recurrence, no other evidence of metastatic  disease ? Left video-assisted thoracoscopy, wedge resection 10/22/2019-left upper lobe wedge resection with metastatic adenocarcinoma (positive for cytokeratin 20 and CDX2, negative for cytokeratin 7 and  TTF-1), no carcinoma identified in one lymph node, margins uninvolved by carcinoma; 10 L lymph node biopsy with no carcinoma identified; left pleural biopsy with no carcinoma identified. ? Ileostomy takedown 03/20/2020  2.Pain and bleeding secondary to #1.Resolved.  3.Skin breakdown at the gluteal fold secondary to radiation. Resolved.  4.History ofneutropenia secondary to chemotherapy    Disposition: Mr. Sainato is in clinical remission from rectal cancer.  He will return for a CEA and chemistry panel next week.  He will be scheduled for restaging CTs within the next few weeks.  Mr. Christella Noa return for an office visit in 4 months.  I encouraged him to take the COVID-19 vaccine.  He declines for now.  He will use nonstick gauze at the ileostomy site.  Betsy Coder, MD  06/17/2020  4:14 PM

## 2020-06-18 ENCOUNTER — Telehealth: Payer: Self-pay | Admitting: Oncology

## 2020-06-18 NOTE — Telephone Encounter (Signed)
Scheduled appts per 8/3 los. Pt confirmed appt dates and times.

## 2020-06-23 ENCOUNTER — Telehealth: Payer: Self-pay | Admitting: *Deleted

## 2020-06-23 ENCOUNTER — Inpatient Hospital Stay: Payer: 59

## 2020-06-23 ENCOUNTER — Other Ambulatory Visit: Payer: Self-pay

## 2020-06-23 DIAGNOSIS — C20 Malignant neoplasm of rectum: Secondary | ICD-10-CM

## 2020-06-23 LAB — BASIC METABOLIC PANEL - CANCER CENTER ONLY
Anion gap: 7 (ref 5–15)
BUN: 12 mg/dL (ref 6–20)
CO2: 24 mmol/L (ref 22–32)
Calcium: 10.2 mg/dL (ref 8.9–10.3)
Chloride: 107 mmol/L (ref 98–111)
Creatinine: 1.34 mg/dL — ABNORMAL HIGH (ref 0.61–1.24)
GFR, Est AFR Am: >60 mL/min (ref >60)
GFR, Estimated: 58 mL/min — ABNORMAL LOW (ref >60)
Glucose, Bld: 97 mg/dL (ref 70–99)
Potassium: 4.2 mmol/L (ref 3.5–5.1)
Sodium: 138 mmol/L (ref 135–145)

## 2020-06-23 LAB — CEA (IN HOUSE-CHCC): CEA (CHCC-In House): 4.73 ng/mL (ref 0.00–5.00)

## 2020-06-23 NOTE — Telephone Encounter (Signed)
Made aware of CEA and slightly elevated serum creatinine and what this means. Suggested he f/u with his PCP--patient reports he does not have a PCP. Encouraged him to speak w/family/friends to get idea who to be referred to. Call back if he needs [place] to help him find one. Called Tedra Coupe in [place] and made aware of elevation in creatinine. She will be sure radiologist is aware.

## 2020-06-23 NOTE — Telephone Encounter (Signed)
-----   Message from Ladell Pier, MD sent at 06/23/2020  1:12 PM EDT ----- Please call patient, creatinine mildly elevated, needs to f/u with primary MD to address this, scheduled for CTs 8/17, be sure radiology is aware of creatinine level- will need to adjust contrast dose

## 2020-07-01 ENCOUNTER — Ambulatory Visit
Admission: RE | Admit: 2020-07-01 | Discharge: 2020-07-01 | Disposition: A | Discharge status: Home / Self Care | Payer: 59 | Source: Ambulatory Visit | Attending: Oncology | Admitting: Oncology

## 2020-07-01 ENCOUNTER — Other Ambulatory Visit: Payer: Self-pay

## 2020-07-01 DIAGNOSIS — C20 Malignant neoplasm of rectum: Secondary | ICD-10-CM

## 2020-07-01 MED ORDER — IOPAMIDOL (ISOVUE-300) INJECTION 61%
100.0000 mL | Freq: Once | INTRAVENOUS | Status: AC | PRN
  Administered 2020-07-01: 100 mL via INTRAVENOUS

## 2020-07-07 ENCOUNTER — Telehealth: Payer: Self-pay | Admitting: *Deleted

## 2020-07-07 NOTE — Telephone Encounter (Signed)
Called patient w/CT results and nodularity at resection site. Will call him back after conference on 07/16/21 with recommendations of radiologists. He has not worked on getting a PCP yet.

## 2020-07-07 NOTE — Telephone Encounter (Signed)
-----   Message from Ladell Pier, MD sent at 07/03/2020  9:35 PM EDT -----  ----- Message ----- From: Ladell Pier, MD Sent: 07/03/2020   6:50 AM EDT To: [place] [facility]  Please call patient, CTs negative for cancer except nodularity at resection site that is likely post-op scar, will present at conference and decide when to repeat noncontrast CT chest, schedule for conference radiology only 9/1

## 2020-07-16 ENCOUNTER — Other Ambulatory Visit: Payer: Self-pay

## 2020-07-17 ENCOUNTER — Other Ambulatory Visit: Payer: Self-pay | Admitting: Oncology

## 2020-07-17 DIAGNOSIS — C20 Malignant neoplasm of rectum: Secondary | ICD-10-CM

## 2020-07-23 ENCOUNTER — Telehealth: Payer: Self-pay | Admitting: *Deleted

## 2020-07-23 DIAGNOSIS — C20 Malignant neoplasm of rectum: Secondary | ICD-10-CM

## 2020-07-23 NOTE — Telephone Encounter (Signed)
Informed patient that per tumor board it is suggested to have PET scan to especially evaluate the lung nodules. He is in agreement. Order placed and managed care notified to work on [place].

## 2020-07-29 ENCOUNTER — Other Ambulatory Visit: Payer: Self-pay | Admitting: Oncology

## 2020-08-04 ENCOUNTER — Ambulatory Visit (HOSPITAL_COMMUNITY): Payer: 59

## 2020-09-29 ENCOUNTER — Ambulatory Visit (HOSPITAL_COMMUNITY): Payer: 59

## 2020-09-30 ENCOUNTER — Other Ambulatory Visit: Payer: Self-pay | Admitting: *Deleted

## 2020-09-30 DIAGNOSIS — C20 Malignant neoplasm of rectum: Secondary | ICD-10-CM

## 2020-09-30 NOTE — Progress Notes (Signed)
MD wants to pursue PET scan over CT chest to evaluate lung nodules. New order placed and managed care notified.

## 2020-10-02 ENCOUNTER — Telehealth: Payer: Self-pay | Admitting: *Deleted

## 2020-10-02 NOTE — Telephone Encounter (Signed)
Left VM with appointment for 10/23/20 for lab/OV. PET scan on 11/30 and he agreed to this. Mailed new appointment to his home address.

## 2020-10-13 ENCOUNTER — Other Ambulatory Visit: Payer: 59

## 2020-10-13 ENCOUNTER — Ambulatory Visit: Payer: 59 | Admitting: Nurse Practitioner

## 2020-10-14 ENCOUNTER — Other Ambulatory Visit: Payer: Self-pay

## 2020-10-14 ENCOUNTER — Ambulatory Visit (HOSPITAL_COMMUNITY)
Admission: RE | Admit: 2020-10-14 | Discharge: 2020-10-14 | Disposition: A | Discharge status: Home / Self Care | Payer: 59 | Source: Ambulatory Visit | Attending: Oncology | Admitting: Oncology

## 2020-10-14 DIAGNOSIS — C20 Malignant neoplasm of rectum: Secondary | ICD-10-CM | POA: Diagnosis present

## 2020-10-14 DIAGNOSIS — R918 Other nonspecific abnormal finding of lung field: Secondary | ICD-10-CM | POA: Diagnosis not present

## 2020-10-14 LAB — GLUCOSE, CAPILLARY: Glucose-Capillary: 96 mg/dL (ref 70–99)

## 2020-10-14 MED ORDER — FLUDEOXYGLUCOSE F - 18 (FDG) INJECTION
10.1000 | Freq: Once | INTRAVENOUS | Status: AC | PRN
  Administered 2020-10-14: 10.1 via INTRAVENOUS

## 2020-10-15 ENCOUNTER — Other Ambulatory Visit: Payer: Self-pay | Admitting: Nurse Practitioner

## 2020-10-15 DIAGNOSIS — C20 Malignant neoplasm of rectum: Secondary | ICD-10-CM

## 2020-10-23 ENCOUNTER — Encounter: Payer: Self-pay | Admitting: Nurse Practitioner

## 2020-10-23 ENCOUNTER — Other Ambulatory Visit: Payer: Self-pay

## 2020-10-23 ENCOUNTER — Inpatient Hospital Stay (HOSPITAL_BASED_OUTPATIENT_CLINIC_OR_DEPARTMENT_OTHER): Payer: 59 | Admitting: Nurse Practitioner

## 2020-10-23 ENCOUNTER — Inpatient Hospital Stay: Payer: 59 | Attending: Nurse Practitioner

## 2020-10-23 VITALS — BP 138/85 | HR 72 | Temp 98.2°F | Resp 18 | Ht 69.0 in | Wt 197.2 lb

## 2020-10-23 DIAGNOSIS — Z9221 Personal history of antineoplastic chemotherapy: Secondary | ICD-10-CM | POA: Insufficient documentation

## 2020-10-23 DIAGNOSIS — Z923 Personal history of irradiation: Secondary | ICD-10-CM | POA: Insufficient documentation

## 2020-10-23 DIAGNOSIS — C20 Malignant neoplasm of rectum: Secondary | ICD-10-CM

## 2020-10-23 DIAGNOSIS — C7802 Secondary malignant neoplasm of left lung: Secondary | ICD-10-CM | POA: Diagnosis present

## 2020-10-23 LAB — CEA (IN HOUSE-CHCC): CEA (CHCC-In House): 19.24 ng/mL — ABNORMAL HIGH (ref 0.00–5.00)

## 2020-10-23 MED ORDER — HYDROCODONE-ACETAMINOPHEN 5-325 MG PO TABS: 1.0000 | ORAL_TABLET | Freq: Three times a day (TID) | ORAL | 0 refills | Status: DC | PRN

## 2020-10-23 NOTE — Progress Notes (Addendum)
[facility] OFFICE PROGRESS NOTE   Diagnosis: Rectal cancer  INTERVAL HISTORY:   Mr. Werntz returns for follow-up.  He reports rectal pain for the past month.  He is taking Tylenol and ibuprofen at least 1 time a day.  Bowels move regularly as long as he takes Metamucil.  He intermittently notes blood on the toilet tissue.  No abnormal rectal drainage.  He denies fever.  No cough or shortness of breath.  Objective:  Vital signs in last 24 hours:  Blood pressure 138/85, pulse 72, temperature 98.2 F (36.8 C), temperature source Tympanic, resp. rate 18, height 5\' 9"  (1.753 m), weight 197 lb 3.2 oz (89.4 kg), SpO2 100 %.    HEENT: No thrush or ulcers. Lymphatics: No palpable cervical, supraclavicular or axillary lymph nodes. Resp: Lungs clear bilaterally. Cardio: Regular rate and rhythm. GI: Abdomen soft and nontender.  No hepatomegaly. Vascular: No leg edema.   Lab Results:  Lab Results  Component Value Date   WBC 6.7 03/22/2020   HGB 15.6 03/22/2020   HCT 46.6 03/22/2020   MCV 91.0 03/22/2020   PLT 176 03/22/2020   NEUTROABS 2.5 03/11/2020    Imaging:  No results found.  Medications: I have reviewed the patient's current medications.  Assessment/Plan: 1. Rectal cancer ? Colonoscopy 09/08/2017, rectal mass at 5 cm from the anal verge, biopsy confirmed invasive adenocarcinoma ? Staging CTs 09/15/2017, rectal mass and perirectal lymphadenopathy, indeterminate right upper lobe nodule ? Elevated CEA ? MRI pelvis 10/07/2017-5.3 cm circumferential lower rectal mass with perirectal extension along the inferior mesenteric vein;T3, N1; distance from tumor to the anal sphincter is 5 cm. ? Initiation of radiation and concurrent Xeloda chemotherapy 10/12/2017;completion of radiation/Xeloda 11/23/2017. ? Surgery 02/01/18 per Dr. Nadeen Landau; laparoscopic lower anterior rectosigmoid resection, coloanal anastomosis, diverting loop ileostomy, flexible  sigmoidoscopy; ypT3, pN0 ? Developed low-grade fever and abdominal pain on POD 4, CT AP on 02/06/18 and 02/09/18 shows small loculated fluid collections in RLQ and presacral space, consulted IR for transgluteal presacral abscess drain, treated with IV zosyn then transitioned to po cipro and flagyl ? Follow-up CT 03/24/2018-2.2 x 4.9 x 4.0 cm thick-walled presacral gas and fluid collection/abscess, minimally increased status post removal of surgical drain. No evidence of recurrent or metastatic disease. ? Cycle 1 adjuvant Xeloda 04/12/2018 ? Cycle 2 adjuvant Xeloda 05/03/2018 ? Cycle 3 adjuvant Xeloda 06/05/2018 (Xeloda dose reduced to 1500 mg twice daily secondary to mild neutropenia) ? Cycle 4adjuvant Xeloda 06/26/2018 ? Cycle 5 adjuvant Xeloda 07/18/2018 ? CTs 08/21/2018-no evidence of tumor recurrence or metastatic disease in the chest, abdomen or pelvis. Presacral soft tissue thickening and some calcifications compatible with postradiation changes. Stable probablybenign 3 mm right upper lobe nodule. ? CTs 09/18/2019-new 1.2 cm pulmonary nodule within the left upper lobe; stable 3 mm nodule right upper lobe. ? PET scan 10/03/2019-hypermetabolic left upper lobe nodule, no evidence of local recurrence, no other evidence of metastatic disease ? Left video-assisted thoracoscopy, wedge resection 10/22/2019-left upper lobe wedge resection with metastatic adenocarcinoma (positive for cytokeratin 20 and CDX2, negative for cytokeratin 7 and TTF-1), no carcinoma identified in one lymph node, margins uninvolved by carcinoma; 10 L lymph node biopsy with no carcinoma identified; left pleural biopsy with no carcinoma identified. ? Ileostomy takedown 03/20/2020 ? CTs 07/01/2020-nodularity left upper lobe at site of previous wedge resection, atypical appearance for simple postoperative change.  Soft tissue density about the ostomy takedown site. ? PET scan [id number]/[id number] metabolic activity along the extraluminal locules  of  gas contained in the increasingly prominent presacral density.  Enlarging hypermetabolic nodules in the left upper lobe particularly along the wedge resection site.  Indistinct faintly accentuated right infrahilar and left retrohilar activity.  2.Pain and bleeding secondary to #1.Resolved.  3.Skin breakdown at the gluteal fold secondary to radiation. Resolved.  4.History ofneutropenia secondary to chemotherapy   Disposition: Mr. Ndiaye appears stable.  Dr. Benay Spice reviewed the PET scan results/images with him at today's visit.  We discussed that the hypermetabolic activity in the left upper lobe is likely recurrent cancer.  The high metabolic activity in the presacral region may be infection.  He is symptomatic with pain.  We will arrange for an appointment with Dr. Dema Severin to evaluate.  Prescription sent to his pharmacy for hydrocodone.  He understands that he should not drive while taking pain medication.  He will return for follow-up/further discussion regarding treatment options for the recurrent cancer in the left upper lobe on 11/05/2020.  He will contact the office in the interim with any problems.  Patient seen with Dr. Benay Spice.    Ned Card ANP/GNP-BC   10/23/2020  10:04 AM This was a shared visit with Ned Card.  We reviewed the PET findings and images with Mr. Denardo.  He has evidence of progressive metastatic disease involving left upper lobe nodules.  He may be a candidate for SBRT.  There appears to be an inflammatory process in the pelvis.  He is symptomatic with pain.  I will refer him to Dr. Dema Severin to consider the need for antibiotics and further diagnostic evaluation.Julieanne Manson, MD

## 2020-10-30 NOTE — Progress Notes (Signed)
Attempted to obtain medical history via telephone, unable to reach at this time. I left a voicemail to return pre surgical testing department's phone call.  

## 2020-10-31 ENCOUNTER — Telehealth: Payer: Self-pay | Admitting: *Deleted

## 2020-10-31 NOTE — Telephone Encounter (Signed)
Left VM that his 12:15 visit on 12/22 has been moved to 1:45 pm due to scheduling conflict.

## 2020-11-03 ENCOUNTER — Other Ambulatory Visit (HOSPITAL_COMMUNITY)
Admission: RE | Admit: 2020-11-03 | Discharge: 2020-11-03 | Disposition: A | Discharge status: Home / Self Care | Payer: 59 | Source: Ambulatory Visit | Attending: Surgery | Admitting: Surgery

## 2020-11-03 DIAGNOSIS — Z01812 Encounter for preprocedural laboratory examination: Secondary | ICD-10-CM | POA: Diagnosis not present

## 2020-11-03 DIAGNOSIS — Z20822 Contact with and (suspected) exposure to covid-19: Secondary | ICD-10-CM | POA: Diagnosis not present

## 2020-11-04 LAB — SARS CORONAVIRUS 2 (TAT 6-24 HRS): SARS Coronavirus 2: NEGATIVE

## 2020-11-05 ENCOUNTER — Encounter: Payer: Self-pay | Admitting: Nurse Practitioner

## 2020-11-05 ENCOUNTER — Telehealth: Payer: Self-pay | Admitting: Radiation Oncology

## 2020-11-05 ENCOUNTER — Other Ambulatory Visit: Payer: Self-pay

## 2020-11-05 ENCOUNTER — Inpatient Hospital Stay (HOSPITAL_BASED_OUTPATIENT_CLINIC_OR_DEPARTMENT_OTHER): Payer: 59 | Admitting: Nurse Practitioner

## 2020-11-05 VITALS — BP 131/100 | HR 77 | Temp 97.7°F | Resp 18 | Ht 69.0 in | Wt 193.1 lb

## 2020-11-05 DIAGNOSIS — C20 Malignant neoplasm of rectum: Secondary | ICD-10-CM | POA: Diagnosis not present

## 2020-11-05 MED ORDER — HYDROCODONE-ACETAMINOPHEN 5-325 MG PO TABS: 1.0000 | ORAL_TABLET | Freq: Three times a day (TID) | ORAL | 0 refills | Status: DC | PRN

## 2020-11-05 NOTE — Progress Notes (Addendum)
[place] OFFICE PROGRESS NOTE   Diagnosis: Rectal cancer  INTERVAL HISTORY:   Craig [name] returns as scheduled.  He reports continued rectal pain though has noticed some improvement.  Bowels overall moving regularly.  He intermittently notes blood on the toilet tissue if he wipes excessively.  No nausea or vomiting.  He has a good appetite.  No fever, cough, shortness of breath.  Objective:  Vital signs in last 24 hours:  Blood pressure (!) 131/100, pulse 77, temperature 97.7 F (36.5 C), temperature source Tympanic, resp. rate 18, height 5\' 9"  (1.753 m), weight 193 lb 1.6 oz (87.6 kg), SpO2 99 %.    Resp: Lungs clear bilaterally. Cardio: Regular rate and rhythm. GI: No hepatomegaly. Vascular: No leg edema.    Lab Results:  Lab Results  Component Value Date   WBC 6.7 03/22/2020   HGB 15.6 03/22/2020   HCT 46.6 03/22/2020   MCV 91.0 03/22/2020   PLT 176 03/22/2020   NEUTROABS 2.5 03/11/2020    Imaging:  No results found.  Medications: I have reviewed the patient's current medications.  Assessment/Plan: 1. Rectal cancer ? Colonoscopy 09/08/2017, rectal mass at 5 cm from the anal verge, biopsy confirmed invasive adenocarcinoma ? Staging CTs 09/15/2017, rectal mass and perirectal lymphadenopathy, indeterminate right upper lobe nodule ? Elevated CEA ? MRI pelvis 10/07/2017-5.3 cm circumferential lower rectal mass with perirectal extension along the inferior mesenteric vein;T3, N1; distance from tumor to the anal sphincter is 5 cm. ? Initiation of radiation and concurrent Xeloda chemotherapy 10/12/2017;completion of radiation/Xeloda 11/23/2017. ? Surgery 02/01/18 per Dr. Nadeen Landau; laparoscopic lower anterior rectosigmoid resection, coloanal anastomosis, diverting loop ileostomy, flexible sigmoidoscopy; ypT3, pN0 ? Developed low-grade fever and abdominal pain on POD 4, CT AP on 02/06/18 and 02/09/18 shows small loculated fluid collections in RLQ and  presacral space, consulted IR for transgluteal presacral abscess drain, treated with IV zosyn then transitioned to po cipro and flagyl ? Follow-up CT 03/24/2018-2.2 x 4.9 x 4.0 cm thick-walled presacral gas and fluid collection/abscess, minimally increased status post removal of surgical drain. No evidence of recurrent or metastatic disease. ? Cycle 1 adjuvant Xeloda 04/12/2018 ? Cycle 2 adjuvant Xeloda 05/03/2018 ? Cycle 3 adjuvant Xeloda 06/05/2018 (Xeloda dose reduced to 1500 mg twice daily secondary to mild neutropenia) ? Cycle 4adjuvant Xeloda 06/26/2018 ? Cycle 5 adjuvant Xeloda 07/18/2018 ? CTs 08/21/2018-no evidence of tumor recurrence or metastatic disease in the chest, abdomen or pelvis. Presacral soft tissue thickening and some calcifications compatible with postradiation changes. Stable probablybenign 3 mm right upper lobe nodule. ? CTs 09/18/2019-new 1.2 cm pulmonary nodule within the left upper lobe; stable 3 mm nodule right upper lobe. ? PET scan 10/03/2019-hypermetabolic left upper lobe nodule, no evidence of local recurrence, no other evidence of metastatic disease ? Left video-assisted thoracoscopy, wedge resection 10/22/2019-left upper lobe wedge resection with metastatic adenocarcinoma (positive for cytokeratin 20 and CDX2, negative for cytokeratin 7 and TTF-1), no carcinoma identified in one lymph node, margins uninvolved by carcinoma; 10 L lymph node biopsy with no carcinoma identified; left pleural biopsy with no carcinoma identified. ? Ileostomy takedown 03/20/2020 ? CTs 07/01/2020-nodularity left upper lobe at site of previous wedge resection, atypical appearance for simple postoperative change.  Soft tissue density about the ostomy takedown site. ? PET scan 90/30/0923-RAQT metabolic activity along the extraluminal locules of gas contained in the increasingly prominent presacral density.  Enlarging hypermetabolic nodules in the left upper lobe particularly along the wedge resection  site.  Indistinct faintly accentuated  right infrahilar and left retrohilar activity. ? Plan flexible sigmoidoscopy  2.Pain and bleeding secondary to #1.Resolved.  3.Skin breakdown at the gluteal fold secondary to radiation. Resolved.  4.History ofneutropenia secondary to chemotherapy  Disposition: Craig [name] appears unchanged.  He is scheduled for a flexible sigmoidoscopy tomorrow.  We made a referral to Dr. Lisbeth Renshaw to consider SBRT to the left lung.  He will return for follow-up here in approximately 2 months.  He will contact the office in the interim with any problems.  Patient seen with Dr. Benay Spice.    Ned Card ANP/GNP-BC   11/05/2020  2:02 PM This was a shared visit with Ned Card.  Craig [name] saw Dr. Dema Severin and is scheduled for a sigmoidoscopy tomorrow.  His case was presented at the GI tumor conference today.  The recommendation is to proceed with SBRT to the left lung lesions if no tumor is found in the colon.  He is considering surgical options for management of the rectal discomfort.  Craig [name] agrees to a radiation oncology referral.  Julieanne Manson, MD

## 2020-11-05 NOTE — Telephone Encounter (Signed)
We received a referral from Dr. Gearldine Shown office. LVM for patient to call us to schedule a new patient consult with Dr. Kyung Rudd (last time treated was 2018).

## 2020-11-05 NOTE — Progress Notes (Signed)
The proposed treatment discussed in conference is for discussion purposes only and is not a binding recommendation.  The patients have not been physically examined, or presented with their treatment options.  Therefore, final treatment plans cannot be decided.   

## 2020-11-06 ENCOUNTER — Encounter (HOSPITAL_COMMUNITY)
Admission: RE | Disposition: A | Discharge status: Home / Self Care | Payer: Self-pay | Source: Home / Self Care | Attending: Surgery

## 2020-11-06 ENCOUNTER — Telehealth: Payer: Self-pay | Admitting: Nurse Practitioner

## 2020-11-06 ENCOUNTER — Ambulatory Visit (HOSPITAL_COMMUNITY): Payer: 59 | Admitting: Certified Registered Nurse Anesthetist

## 2020-11-06 ENCOUNTER — Other Ambulatory Visit: Payer: Self-pay

## 2020-11-06 ENCOUNTER — Ambulatory Visit (HOSPITAL_COMMUNITY)
Admission: RE | Admit: 2020-11-06 | Discharge: 2020-11-06 | Disposition: A | Discharge status: Home / Self Care | Payer: 59 | Attending: Surgery | Admitting: Surgery

## 2020-11-06 ENCOUNTER — Encounter (HOSPITAL_COMMUNITY): Payer: Self-pay | Admitting: Surgery

## 2020-11-06 DIAGNOSIS — R102 Pelvic and perineal pain: Secondary | ICD-10-CM | POA: Diagnosis present

## 2020-11-06 DIAGNOSIS — C7802 Secondary malignant neoplasm of left lung: Secondary | ICD-10-CM | POA: Insufficient documentation

## 2020-11-06 DIAGNOSIS — Z85048 Personal history of other malignant neoplasm of rectum, rectosigmoid junction, and anus: Secondary | ICD-10-CM | POA: Diagnosis not present

## 2020-11-06 DIAGNOSIS — Z980 Intestinal bypass and anastomosis status: Secondary | ICD-10-CM | POA: Insufficient documentation

## 2020-11-06 DIAGNOSIS — Z923 Personal history of irradiation: Secondary | ICD-10-CM | POA: Diagnosis not present

## 2020-11-06 DIAGNOSIS — Z791 Long term (current) use of non-steroidal anti-inflammatories (NSAID): Secondary | ICD-10-CM | POA: Insufficient documentation

## 2020-11-06 HISTORY — PX: FLEXIBLE SIGMOIDOSCOPY: SHX5431

## 2020-11-06 SURGERY — SIGMOIDOSCOPY, FLEXIBLE
Anesthesia: Monitor Anesthesia Care

## 2020-11-06 MED ORDER — FLEET ENEMA 7-19 GM/118ML RE ENEM
1.0000 | ENEMA | Freq: Once | RECTAL | Status: AC
  Administered 2020-11-06: 11:00:00 1 via RECTAL

## 2020-11-06 MED ORDER — LACTATED RINGERS IV SOLN: INTRAVENOUS | Status: DC | PRN

## 2020-11-06 MED ORDER — PROPOFOL 500 MG/50ML IV EMUL
INTRAVENOUS | Status: AC
  Filled 2020-11-06: qty 50

## 2020-11-06 MED ORDER — PROPOFOL 10 MG/ML IV BOLUS
INTRAVENOUS | Status: DC | PRN
Start: 2020-11-06 — End: 2020-11-06
  Administered 2020-11-06 (×2): 40 mg via INTRAVENOUS
  Administered 2020-11-06: 20 mg via INTRAVENOUS

## 2020-11-06 MED ORDER — LIDOCAINE 2% (20 MG/ML) 5 ML SYRINGE
INTRAMUSCULAR | Status: DC | PRN
  Administered 2020-11-06: 100 mg via INTRAVENOUS

## 2020-11-06 MED ORDER — FLEET ENEMA 7-19 GM/118ML RE ENEM
ENEMA | RECTAL | Status: AC
  Filled 2020-11-06: qty 1

## 2020-11-06 MED ORDER — LACTATED RINGERS IV SOLN: Freq: Once | INTRAVENOUS | Status: AC

## 2020-11-06 MED ORDER — PROPOFOL 500 MG/50ML IV EMUL
INTRAVENOUS | Status: DC | PRN
  Administered 2020-11-06: 125 ug/kg/min via INTRAVENOUS

## 2020-11-06 MED ORDER — PROPOFOL 10 MG/ML IV BOLUS
INTRAVENOUS | Status: AC
  Filled 2020-11-06: qty 20

## 2020-11-06 NOTE — Anesthesia Preprocedure Evaluation (Signed)
Anesthesia Evaluation  Patient identified by MRN, date of birth, ID band Patient awake    Reviewed: Allergy & Precautions, NPO status , Patient's Chart, lab work & pertinent test results  Airway Mallampati: II  TM Distance: >3 FB Neck ROM: Full    Dental  (+) Dental Advisory Given, Poor Dentition, Missing   Pulmonary former smoker,    Pulmonary exam normal breath sounds clear to auscultation       Cardiovascular negative cardio ROS Normal cardiovascular exam+ dysrhythmias  Rhythm:Regular Rate:Normal     Neuro/Psych Anxiety negative neurological ROS     GI/Hepatic Neg liver ROS, GERD  Medicated and Controlled,Rectal pan Urgency   Endo/Other  negative endocrine ROS  Renal/GU negative Renal ROS  negative genitourinary   Musculoskeletal negative musculoskeletal ROS (+)   Abdominal   Peds  Hematology negative hematology ROS (+)   Anesthesia Other Findings   Reproductive/Obstetrics                             Anesthesia Physical Anesthesia Plan  ASA: II  Anesthesia Plan: MAC   Post-op Pain Management:    Induction: Intravenous  PONV Risk Score and Plan: 1 and Treatment may vary due to age or medical condition and Propofol infusion  Airway Management Planned: Natural Airway, Simple Face Mask and Nasal Cannula  Additional Equipment:   Intra-op Plan:   Post-operative Plan:   Informed Consent: I have reviewed the patients History and Physical, chart, labs and discussed the procedure including the risks, benefits and alternatives for the proposed anesthesia with the patient or authorized representative who has indicated his/her understanding and acceptance.     Dental advisory given  Plan Discussed with: CRNA and Anesthesiologist  Anesthesia Plan Comments:         Anesthesia Quick Evaluation

## 2020-11-06 NOTE — Anesthesia Postprocedure Evaluation (Signed)
Anesthesia Post Note  Patient: Craig [name]  Procedure(s) Performed: DIAGNOSTIC FLEXIBLE SIGMOIDOSCOPY (N/A )     Patient location during evaluation: PACU Anesthesia Type: MAC Level of consciousness: awake and alert and oriented Pain management: pain level controlled Vital Signs Assessment: post-procedure vital signs reviewed and stable Respiratory status: spontaneous breathing, nonlabored ventilation and respiratory function stable Cardiovascular status: stable and blood pressure returned to baseline Postop Assessment: no apparent nausea or vomiting Anesthetic complications: no   No complications documented.  Last Vitals:  Vitals:   11/06/20 1140 11/06/20 1150  BP: 118/75   Pulse: 73 69  Resp: 14 15  Temp:    SpO2: 100% 100%    Last Pain:  Vitals:   11/06/20 1126  TempSrc: Oral  PainSc:                  Qusay Villada A.

## 2020-11-06 NOTE — Op Note (Signed)
[facility] Patient Name: Craig [name] Procedure Date: 11/06/2020 MRN: [id number] Attending MD: Ileana Roup MD, MD Date of Birth: [birth year] CSN: [id number] Age: 59 Admit Type: Outpatient Procedure:                Flexible Sigmoidoscopy Indications:              Pelvic pain Providers:                Sharon Mt. Tracina Beaumont MD, MD, Erenest Rasher, RN,                            Tyrone Apple, Technician, Adair Laundry, CRNA Referring MD:              Medicines:                Monitored Anesthesia Care Complications:            No immediate complications. Estimated Blood Loss:     Estimated blood loss: none. Procedure:                Pre-Anesthesia Assessment:                           - Prior to the procedure, a History and Physical                            was performed, and patient medications, allergies                            and sensitivities were reviewed. The patient's                            tolerance of previous anesthesia was reviewed.                           - The risks and benefits of the procedure and the                            sedation options and risks were discussed with the                            patient. All questions were answered and informed                            consent was obtained.                           After obtaining informed consent, the scope was                            passed under direct vision. The [id number]-[id number] ([id number])                            was introduced through the anus and advanced to the                            the  splenic flexure. The flexible sigmoidoscopy was                            accomplished without difficulty. The patient                            tolerated the procedure well. The quality of the                            bowel preparation was poor. Scope In: Scope Out: Findings:      A post-surgical anastomosis was found on digital exam. This was found to       be patent.  Pertinent negatives include normal sphincter tone.      There was evidence of a prior end-to-end low-anterior anastomosis in the       distal rectum. This was patent with anastomotic 'sinus' posteriorly and       was characterized by healthy appearing mucosa without any evident       'recurrence' or mass. The anastomosis was traversed. Estimated blood       loss: none. No biopsies or other specimens were collected for this exam. Impression:               - Preparation of the colon was poor.                           - Patent post-surgical anastomosis found on digital                            exam.                           - Patent with anastomotic 'sinus' posteriorly                            end-to-end low-anterior anastomosis - this is where                            he previously had a leak; this is contained and now                            a 'sinus.' This is characterized by healthy                            appearing mucosa without any evident recurrence or                            mass. No specimens collected. Moderate Sedation:      Not Applicable - Patient had care per Anesthesia. Recommendation:           - Continue present medications.                           - Miralax 1 capful (17 grams) in 8 ounces of water  PO daily indefinitely.                           - Use fiber, for example Citrucel, Fibercon, Konsyl                            or Metamucil.                           - Return to my office in 1 month. Procedure Code(s):        --- Professional ---                           [phone], Sigmoidoscopy, flexible; diagnostic,                            including collection of specimen(s) by brushing or                            washing, when performed (separate procedure) Diagnosis Code(s):        --- Professional ---                           Z98.0, Intestinal bypass and anastomosis status                           R10.2, Pelvic and  perineal pain CPT copyright 2019 American Medical Association. All rights reserved. The codes documented in this report are preliminary and upon coder review may  be revised to meet current compliance requirements. Nadeen Landau, MD Ileana Roup MD, MD 11/06/2020 11:29:49 AM This report has been signed electronically. Number of Addenda: 0

## 2020-11-06 NOTE — Telephone Encounter (Signed)
Scheduled appointment per 12/22 los. Called patient, no answer. Left message with appointment date and time.

## 2020-11-06 NOTE — H&P (Signed)
CC: Long-term f/u  HPI: [name] is a very pleasant 59 year old male referred to me by Dr. Laural Golden for evaluation of rectal cancer and we initially met him 09/2017. He underwent his first ever colonoscopy 09/08/17. Findings were remarkable for a partially obstructing tumor in the rectum approximate 5 cm needle verge by endoscopic evaluation. This is biopsied and returned invasive adenocarcinoma. The rest of his endoscopic exam was unremarkable.  He subsequently underwent staging CT chest/abdomen/pelvis which was notable for a bulky rectal mass with perirectal nodal metastases, nonspecific right upper lobe nodule but no other evidence of distant metastatic disease.  MRI Pelvis 10/07/17 - rectal mass with shortest distance from tumor to anal sphincter being 5 cm; tumor extending on the left and extending superiorly along the course of the IMV. One enlarged pathologic appearing 53m perirectal LN. On imaging this was read as a T3N1 lesion.  He subsequently underwent neoadjuvant chemoradiation with Xeloda under the care of Dr. SBenay Spice- which he completed 11/23/17. He tolerated this therapy well and noted substantial improvement in his symptoms throughout his treatment course. He no longer had partial obstructive symptoms, was tolerating a diet without nausea or vomiting and had noted substantially improved amounts of bleeding with bowel movements. The pelvic pain and experienced has also resolved.  He went to the OR 02/01/18 for laparoscopic ultra-low anterior resection, double stapled coloanal anastomosis, takedown of splenic flexure, flex sig and diverting loop ileostomy + Cysto/stents by urology. Postoperatively he developed a leak from his coloanal anastomosis that was initially controlled with the surgical drain and IV antibiotics. He also had some smaller fluid collections near the small bowel the right abdomen that were not amenable to percutaneous drainage and were managed with IV antibiotics.  The collection in the pelvis persisted on follow-up imaging and IR was consult for placement of percutaneous drain which they did through the left gluteal approach. He did well from this. Throughout all of this, he was tolerating a diet and had no symptoms. His ileostomy output was managed on Imodium. He is also Discharge hospital with a course of oral antibiotics-Cipro/Flagyl.   Path was reviewed with the patient - ISOLATED FOCI OF RESIDUAL ADENOCARCINOMA INVOLVING RECTOSIGMOID COLON. SEE NOTE. - TUMOR INVADES INTO PERICOLONIC SOFT TISSUE. - LARGEST FOCUS OF CARCINOMA MEASURES 0.2 CM. - NEAR COMPLETE TREATMENT RESPONSE (TUMOR REGRESSION SCORE 1). - ALL MARGINS ARE NEGATIVE FOR CARCINOMA. - NEGATIVE FOR LYMPHOVASCULAR OR PERINEURAL INVASION. - SIX LYMPH NODES, NEGATIVE FOR CARCINOMA (0/6). SEE NOTE - SEPARATE TUBULAR ADENOMA WITHOUT HIGH GRADE DYSPLASIA OR MALIGNANCY. Donuts negative. ypT3N0 (0/6)M0.  Repeat CT scan was obtained 02/24/18 - which demonstrated near complete resolution of the presacral fluid collection and near complete resolution of the fluid collection in the right lower quadrant and suprapubic abdominal wall. He continued his course of Cipro/Flagyl which he completed.  CT C/A/P 08/21/18 - no evidence of recurrence or metastatic disease  Completed adjuvant therapy with Xeloda 07/2018. GGE 09/04/18 = demonstrated a small presacral cavity communicating with the distal colorectal anastomosis in the region of the previous drain. The exam was otherwise unremarkable. Flexible sigmoidoscopy by myself 09/13/18 which demonstrated a patent and normal appearing colorectal anastomosis with the exception of some granulation tissue which was heaping up in the posterior midline. This is at the location of his sinus tract seen on his GGE.  Colonoscopy with Dr. RLaural Golden1/30/2020 which demonstrated some stool in the cecum. A patent end-to-end anastomosis was identified with single staple  and scar but no ulceration  or granulation tissue. He underwent repeat GGE 01/02/2019 which demonstrated no persistent leakage of contrast in the rectal area. Postradiation changes seen. F/u flex sig by myself 05/2019 showed patent anastomosis without evident sinus or granulation tissue  CT chest/abdomen/pelvis 09/18/2019. New 1.2 cm pulmonary nodule in the left upper lobe. This is new relative to his prior CTs. Normal-appearing anastomosis at the rectum. No other concerning findings. PET scan demonstrated mild activity at this location but no other concerning areas. He was seen by Dr. Servando Snare and underwent wedge resection of the mass in his left chest. Pathology returned metastatic adenocarcinoma. Margins were negative. He has had a follow-up chest x-ray which demonstrates no concerning features  OR 03/20/20 Takedown DLI. He was discharged on postoperative day #2  CT PET 10/14/20 shows probable "infection" in the presacral region. This is right along the area were he had a prior anastomotic leak that had since healed. There is some extraluminal locules of containing gas within this collection favoring this to be related to that as opposed to a malignancy per se. He has enlarging hypermetabolic nodules in the left upper lobe along the wedge resection site. Accentuated right infrahilar left retrohilar activity.  INTERVAL HX Returns for long-term follow-up. has had ongoing issues with some degree of fecal urgency and clustering of bowel movements. He denies specifically any abdominal pain, fever or chills. His symptoms are primarily of incomplete evacuation. He has tried taking Metamucil as well as previously trialing and Imodium without any significant improvement in his symptoms. He'll have what he describes to be pain in his rectal area that is sharp. It is alleviated if he is able to "successfully evacuated all of his bowels." He has not tried any sort of tap water enemas thus  far.  The patient is a 59 year old male.   Allergies Mammie Lorenzo, LPN; 88/89/1694 50:38 AM) No Known Drug Allergies  [03/02/2018]: Allergies Reconciled   Medication History Mammie Lorenzo, LPN; 88/28/0034 91:79 AM) Tylenol (500MG Capsule, Oral) Active. No Current Medications (Taken starting 10/28/2020) Ibuprofen (200MG Capsule, Oral) Active. HYDROcodone-Acetaminophen (5-325MG Tablet, Oral) Active. Sominex (25MG Tablet, Oral) Active. Alka Seltzer Plus (10-12.5-20-650MG Packet, Oral) Active. Clear Eyes Complete (Ophthalmic) Active. Afrin (0.025% Solution, Nasal) Active. Medications Reconciled    Review of Systems Harrell Gave M. Kattia Selley MD; 10/28/2020 11:00 AM) General Not Present- Anorexia, Appetite Loss, Chills and Fever. Skin Not Present- Brittle Nails and Bruising. HEENT Not Present- Double Vision and Headache. Neck Not Present- Neck Mass and Neck Pain. Respiratory Not Present- Bloody sputum, Chronic Cough and Decreased Exercise Tolerance. Cardiovascular Not Present- Chest Pain and Difficulty Breathing Lying Down. Gastrointestinal Not Present- Abdominal Pain, Bloating, Bloody Stool, Change in Bowel Habits, Chronic diarrhea, Constipation, Difficulty Swallowing, Excessive gas, Gets full quickly at meals, Hemorrhoids, Indigestion, Nausea, Rectal Pain and Vomiting. Male Genitourinary Not Present- Blood in Urine and Change in Urinary Stream. Musculoskeletal Not Present- Back Pain and Backache. Neurological Not Present- Decreased Memory and Difficulty Speaking. Psychiatric Not Present- Anxiety and Depression. Endocrine Not Present- Appetite Changes and New Diabetes. Hematology Not Present- Easy Bleeding and Easy Bruising.  Vitals Claiborne Billings Dockery LPN; 15/03/6978 48:01 AM) 10/28/2020 10:25 AM Weight: 195.6 lb Height: 69in Body Surface Area: 2.05 m Body Mass Index: 28.88 kg/m  Pulse: 92 (Regular)  BP: 122/68(Sitting, Left Arm, Standard)       Physical  Exam Harrell Gave M. Jolina Symonds MD; 10/28/2020 11:01 AM) The physical exam findings are as follows: Note: Constitutional: No acute distress; conversant; wearing surgical mask Eyes: Moist conjunctiva; anicteric  sclerae; wearing glasses Neck: Trachea midline Lungs: Normal respiratory effort CV: rrr GI: Abdomen soft, nontender, nondistended; no palpable hepatosplenomegaly or masses. Ileostomy closure site closed. No palpable hernias Anorectal: No perianal skin changes. DRE - normal tone/squeeze; anastomosis palpable; no palpable masses or abnormalities. MSK: Normal gait; no clubbing/cyanosis Psychiatric: Appropriate affect; alert and oriented 3    Assessment & Plan Harrell Gave M. Khayree Delellis MD; 10/28/2020 11:04 AM) RECTAL CANCER (C20) Story: 89M with low rectal cancer now s/p neoadjuvant chemoXRT with Xeloda and surgery 3/20 - lap ultralow AR with DLI and double stapled coloanal anastomosis - 1.5cm distal margin but this was below the mesorectal margin (bare area) - subsequent anastomotic leak controlled with ileostomy and drains, resolved clinically and they were ultimately removed. Path ypT3N0 (0/6) M0; complete TME with uninvolved CRM. Completed adjuvant Xeloda 07/18/18 CT C/A/P 08/21/18 - no evidence of recurrence/metastatic disease GGE 09/04/18 - small presacral sinus at site of prior leak Flex sig 09/13/18 - granulation tissue posterior midline at anastomosis GGE 01/02/2019 - no evidence of persistent leak/sinus Flex sig 05/2019 - no granulation tissue or evidence of sinus - appears to have healed  Surveillance CT C/A/P 09/18/2019 - new 1.2 cm pulmonary nodule in LUL - wedge resection Dr. Servando Snare 10/2019 - metastatic adenoCA, margins clear  OR 03/20/20 - loop ileostomy takedown Impression: -Schedule for flex sig -we spent time reviewing everything with him today. We discussed that flexible sigmoidoscopy would help ensure that there is no evidence of intraluminal recurrence. I would be surprised if  this were the case given the appearance on imaging, however, given his history, needs to be completed. -we spent time discussing potential options going forward. We discussed the potential for surgery which will involve a permanent colostomy with "essentially be a redo pelvic surgery with attempts to get below the anastomosis and remove all this tissue. He was in and up with a permanent end colostomy. I spent time discussing with him potential improvement in quality of life is a pertaining to his current symptoms. He does report that all this pelvic symptoms were absent while he had an ileostomy. We discussed thinking about what life and like with an ostomy and whether or not that will be something he would be interested in pursuing at this point in time. He isn't distended and trialing gentle tap water enemas to see if it helps with evacuation. We discussed not using a full amount of enema that 4-6 ounces of tap water. We discussed instilling once daily prior to having a bowel movement. -He would like additional time to think about everything. In the meantime, we will plan to get a flexible sigmoidoscopy completed. We will also plan to see him back in 6-8 weeks for follow-up or sooner if necessary to discuss potential surgery if he is interested. -We will plan to see back in 1 mo for follow-up or sooner if necessary  This patient encounter took 45 minutes today to perform the following: take history, perform exam, review outside records, interpret imaging, counsel the patient on their diagnosis and document encounter, findings & plan in the EHR  Signed by Ileana Roup, MD (10/28/2020 11:04 AM)

## 2020-11-06 NOTE — Transfer of Care (Signed)
Immediate Anesthesia Transfer of Care Note  Patient: Craig [name]  Procedure(s) Performed: DIAGNOSTIC FLEXIBLE SIGMOIDOSCOPY (N/A )  Patient Location: Endoscopy Unit  Anesthesia Type:MAC  Level of Consciousness: drowsy  Airway & Oxygen Therapy: Patient Spontanous Breathing and Patient connected to face mask oxygen  Post-op Assessment: Report given to RN and Post -op Vital signs reviewed and stable  Post vital signs: Reviewed and stable  Last Vitals:  Vitals Value Taken Time  BP    Temp    Pulse 73 11/06/20 1125  Resp 18 11/06/20 1125  SpO2 100 % 11/06/20 1125  Vitals shown include unvalidated device data.  Last Pain:  Vitals:   11/06/20 0951  TempSrc: Temporal  PainSc: 0-No pain         Complications: No complications documented.

## 2020-11-06 NOTE — Discharge Instructions (Signed)
Flexible Sigmoidoscopy, Care After This sheet gives you information about how to care for yourself after your procedure. Your health care provider may also give you more specific instructions. If you have problems or questions, contact your health care provider. What can I expect after the procedure? After the procedure, it is common to have:  Abdominal cramping or pain.  Bloating.  A small amount of rectal bleeding if you had a biopsy. Follow these instructions at home:  Take over-the-counter and prescription medicines only as told by your health care provider.  Do not drive for 24 hours if you received a medicine to help you relax (sedative).  Keep all follow-up visits as told by your health care provider. This is important. Contact a health care provider if:  You have abdominal pain or cramping that gets worse or is not helped with medicine.  You continue to have small amounts of rectal bleeding after 24 hours.  You have nausea or vomiting.  You feel weak or dizzy.  You have a fever. Get help right away if:  You pass large blood clots or see a large amount of blood in the toilet after having a bowel movement.  You have nausea or vomiting for more than 24 hours after the procedure. This information is not intended to replace advice given to you by your health care provider. Make sure you discuss any questions you have with your health care provider. Document Revised: 06/24/2016 Document Reviewed: 01/31/2016 Elsevier Patient Education  2020 Elsevier Inc.  

## 2020-11-08 ENCOUNTER — Encounter (HOSPITAL_COMMUNITY): Payer: Self-pay | Admitting: Surgery

## 2020-11-13 ENCOUNTER — Encounter (HOSPITAL_COMMUNITY): Payer: Self-pay

## 2020-11-19 NOTE — Progress Notes (Signed)
GI Location of Tumor / Histology: Rectal Cancer- Adenocarcinoma, left lung   Craig [name] presented for colonoscopy in 2018 and was found to have a rectal mass at 5 cm from the anal verge.  PET 66/04/44: High metabolic activity along the extraluminal locules of gas contained in the increasingly prominent presacral density.  Enlarging hypermetabolic nodules in the left upper lobe particularly along the wedge resection site.  Indistinct faintly accentuated right infrahilar and left retro-hilar activity.  CT 07/01/2020: Nodularity left upper lobe at site of previous wedge resection, atypical appearance for simple postoperative change.  Soft tissue density about the ostomy takedown site.   Biopsies of   Past/Anticipated interventions by surgeon, if any:  Dr. Dema Severin 11/06/2020 -We discussed that flexible sigmoidoscopy would help ensure that there is no evidence of intraluminal recurrence. -we spent time discussing potential options going forward. We discussed the potential for surgery which will involve a permanent colostomy with "essentially be a redo pelvic surgery with attempts to get below the anastomosis and remove all this tissue. -He would like additional time to think about everything. In the meantime, we will plan to get a flexible sigmoidoscopy completed. We will also plan to see him back in 6-8 weeks for follow-up or sooner if necessary to discuss potential surgery if he is interested.  -Ileostomy takedown 03/20/2020  -Left video-assisted thoracoscopy, wedge resection 10/22/2019-left upper lobe wedge resection with metastatic adenocarcinoma (positive for cytokeratin 20 and CDX2, negative for cytokeratin 7 and TTF-1), no carcinoma identified in one lymph node, margins uninvolved by carcinoma; 10 L lymph node biopsy with no carcinoma identified; left pleural biopsy with no carcinoma identified.  -Laparoscopic lower anterior rectosigmoid resection, coloanal anastomosis, diverting loop ileostomy,  flexible sigmoidoscopy 02/01/2018.  Past/Anticipated interventions by medical oncology, if any:  Dr. Annamaria Helling 11/05/2020 -He is scheduled for a flexible sigmoidoscopy  -Referral made to Dr. Lisbeth Renshaw to consider SBRT to left lung.   Weight changes, if any: Stable  Bowel/Bladder complaints, if any: Taking miralax and metamucil.  Nausea / Vomiting, if any: No  Pain issues, if any:  Has had rectal pain previously.  He reports no pain at present.   Any blood per rectum: He notes intermittent blood on the toilet tissue if he wipes excessively. No blood noted recently in stools.   Signs/Symptoms  Respiratory complaints, if any: No  Hemoptysis, if any: No cough noted.  Pain issues, if any:  Has occasional chest pain, nothing that last a long time or medication needed.  SAFETY ISSUES:  Prior radiation? Rectum 10/12/2017-11/23/2017  Pacemaker/ICD? No  Possible current pregnancy? n/a  Is the patient on methotrexate? No  Current Complaints/Details:

## 2020-11-20 ENCOUNTER — Ambulatory Visit
Admission: RE | Admit: 2020-11-20 | Discharge: 2020-11-20 | Disposition: A | Discharge status: Home / Self Care | Payer: 59 | Source: Ambulatory Visit | Attending: Oncology | Admitting: Oncology

## 2020-11-20 ENCOUNTER — Ambulatory Visit
Admission: RE | Admit: 2020-11-20 | Discharge: 2020-11-20 | Disposition: A | Discharge status: Home / Self Care | Payer: 59 | Source: Ambulatory Visit | Attending: Radiation Oncology | Admitting: Radiation Oncology

## 2020-11-20 ENCOUNTER — Encounter: Payer: Self-pay | Admitting: Radiation Oncology

## 2020-11-20 ENCOUNTER — Other Ambulatory Visit: Payer: Self-pay

## 2020-11-20 VITALS — Ht 69.0 in | Wt 193.0 lb

## 2020-11-20 DIAGNOSIS — C20 Malignant neoplasm of rectum: Secondary | ICD-10-CM

## 2020-11-20 DIAGNOSIS — C7802 Secondary malignant neoplasm of left lung: Secondary | ICD-10-CM

## 2020-11-20 NOTE — Progress Notes (Addendum)
Radiation Oncology         (336) [phone] ________________________________  Outpatient ReConsultation - Conducted via telephone due to current COVID-19 concerns for limiting patient exposure  I spoke with the patient to conduct this consult visit via telephone to spare the patient unnecessary potential exposure in the healthcare setting during the current COVID-19 pandemic. The patient was notified in advance and was offered a [place] meeting to allow for face to face communication but unfortunately reported that they did not have the appropriate resources/technology to support such a visit and instead preferred to proceed with a telephone visit.    Name: Craig [name] MRN: [id number]  Date of Service: 11/20/2020 DOB: 11/01/1961   CC: Patient, No Pcp Per  Ladell Pier, MD  Diagnosis:  Stage IIIB, cT3N1 inasive adenocarcinoma of the rectum.  Interval Since Last Radiation: 3 years  10/12/2017 - 11/23/2017: The rectum was initially treated to 45 Gy in 25 fractions of 1.8 Gy, followed by a 5.4 Gy boost delivered in 3 fractions to yield a total dose of 50.4 Gy, using the 3-D technique.   Narrative: In summary Craig [name] is a very pleasant 60 y.o. gentleman who was diagnosed with locally advanced adenocarcinoma of the rectum in 2018. He was treated with chemoradiation followed by low anterior resection complicated by postoperative fluid collection.  He continued with adjuvant Xeloda between May and September 2019 and remained in surveillance until a new nodule in the left upper lobe was identified by CT imaging in November 2020.  Pet imaging confirmed hypermetabolism in this location and he underwent wedge resection of the left upper lobe on 10/22/2019 which confirmed metastatic adenocarcinoma consistent with his rectal primary.  He did undergo ileostomy takedown in May 2021, continued surveillance showed nodularity in the left upper lobe at the site of his previous wedge resection on CTs in August of  this past year, a PET scan on 10/14/2020 again showed activity in the left upper lobe particularly along the wedge resection site, he is also had persistent presacral gas and findings consistent with inflammatory change from his prior infection.  He is seen today to discuss options of stereotactic body radiotherapy to the disease in the left upper lobe region.  His most recent scan was his PET on 10/14/2020 and the measurements of the area were in the left upper lobe along the wedge resection site measuring 2.1 x 1.5 cm with an SUV of 14.9 in comparison in the CT images from August 2021 it had been 1.5 cm in greatest dimension.  The nodule was described as having a vertical component extending toward the right hilum, there was another peripheral nodule at the same level measuring 8 x 7 mm with an SUV of 3.9, previously it had been 7 x 6 mm in August.  There was indistinct activity in the right infrahilar region with an SUV of 4 and left retrohilar lymph node with an SUV of 3.7 both of these areas were felt to be indistinct but meriting surveillance.  There was progressive gas-like density along the anastomosis site and presacral tissues with an SUV of 10.4, and the patient will be undergoing additional sigmoidoscopy and follow-up with Dr. Dema Severin.  He is seen however today to discuss the stereotactic body radiation to his left upper lobe region.                          PAST MEDICAL HISTORY:  Past Medical History:  Diagnosis  Date  . Anxiety    Truck driver  . Dysrhythmia    palpitations  . Elevated serum creatinine    1.39 in 06/2012  . Family history of cancer   . GERD (gastroesophageal reflux disease)   . History of kidney stones   . Ileostomy in place [facility])   . Palpitations    + chest pressure; PVCs  . Rectal bleeding 01/07/2017  . Rectal cancer ([place]) 09/30/2017   METASIZED TO LUNGS AND LYMPH NODES    PAST SURGICAL HISTORY: Past Surgical History:  Procedure Laterality Date  . COLONOSCOPY N/A  09/08/2017   Procedure: COLONOSCOPY;  Surgeon: Rogene Houston, MD;  [facility];  Service: Endoscopy;  Laterality: N/A;  . COLONOSCOPY N/A 12/14/2018   Procedure: COLONOSCOPY;  Surgeon: Rogene Houston, MD;  Location: [facility];  Service: Endoscopy;  Laterality: N/A;  830  . CYSTOSCOPY WITH RETROGRADE PYELOGRAM, URETEROSCOPY AND STENT PLACEMENT Bilateral 02/01/2018   Procedure: CYSTOSCOPY WITH RETROGRADE PYELOGRAM, URETEROSCOPY AND URETERAL CATHETERS BILATERAL;  Surgeon: Alexis Frock, MD;  Location: [facility];  Service: Urology;  Laterality: Bilateral;  . FLEXIBLE SIGMOIDOSCOPY N/A 09/13/2018   Procedure: FLEXIBLE SIGMOIDOSCOPY;  Surgeon: Ileana Roup, MD;  Location: [facility];  Service: General;  Laterality: N/A;  . FLEXIBLE SIGMOIDOSCOPY N/A 05/30/2019   Procedure: Beryle Quant;  Surgeon: Ileana Roup, MD;  Location: [facility];  Service: General;  Laterality: N/A;  . FLEXIBLE SIGMOIDOSCOPY N/A 11/06/2020   Procedure: DIAGNOSTIC FLEXIBLE SIGMOIDOSCOPY;  Surgeon: Ileana Roup, MD;  Location: [facility];  Service: General;  Laterality: N/A;  . ILEOSTOMY CLOSURE N/A 03/20/2020   Procedure: ILEOSTOMY TAKEDOWN;  Surgeon: Ileana Roup, MD;  Location: [facility];  Service: General;  Laterality: N/A;  . LAPAROSCOPIC LOW ANTERIOR RESECTION N/A 02/01/2018   Procedure: LAPAROSCOPIC  LOW ANTERIOR RECTOSIGMOID RESECTION, COLOANAL ANASTOMOSIS;  DIVERTING LOOP ILESTOMY; FLEXIBLE SIGMOIDOSCOPY;  Surgeon: Ileana Roup, MD;  Location: [facility];  Service: General;  Laterality: N/A;  . VIDEO ASSISTED THORACOSCOPY (VATS)/WEDGE RESECTION Left 10/22/2019   Procedure: VIDEO ASSISTED THORACOSCOPY (VATS) with mini thoracotomy/Left Upper Lobe Wedge RESECTION x2, left pleural biopsy, lymph node sampling, intercoastal nerve block;  Surgeon: Grace Isaac, MD;  Location: [place];  Service: Thoracic;  Laterality: Left;  Marland Kitchen VIDEO BRONCHOSCOPY N/A 10/22/2019    Procedure: VIDEO BRONCHOSCOPY using anesthesia scope;  Surgeon: Grace Isaac, MD;  Location: [facility] OR;  Service: Thoracic;  Laterality: N/A;    PAST SOCIAL HISTORY:  Social History   Socioeconomic History  . Marital status: Divorced    Spouse name: Not on file  . Number of children: Not on file  . Years of education: Not on file  . Highest education level: Not on file  Occupational History  . Occupation: Truck Geophysicist/field seismologist  Tobacco Use  . Smoking status: Former Research scientist (life sciences)  . Smokeless tobacco: Never Used  . Tobacco comment: quit at age 77  Vaping Use  . Vaping Use: Never used  Substance and Sexual Activity  . Alcohol use: Not Currently    Comment: occ- none since 08/2017  . Drug use: No  . Sexual activity: Not on file  Other Topics Concern  . Not on file  Social History Narrative  . Not on file   Social Determinants of Health   Financial Resource Strain: Not on file  Food Insecurity: Not on file  Transportation Needs: Not on file  Physical Activity: Not on file  Stress: Not on file  Social Connections:  Not on file  Intimate Partner Violence: Not At Risk  . Fear of Current or Ex-Partner: No  . Emotionally Abused: No  . Physically Abused: No  . 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  He is currently on the road working.  PAST FAMILY HISTORY: Family History  Problem Relation Age of Onset  . Cancer Mother 48       Lung cancer  . Leukemia Sister 27  . Cancer Maternal Aunt     MEDICATIONS  Current Outpatient Medications  Medication Sig Dispense Refill  . acetaminophen (TYLENOL) 500 MG tablet Take 1,000 mg by mouth every 6 (six) hours as needed for moderate pain or headache.    Marland Kitchen aspirin-sod bicarb-citric acid (ALKA-SELTZER) 325 MG TBEF tablet Take 650 mg by mouth every 6 (six) hours as needed (heartburn).    . diphenhydrAMINE (SOMINEX) 25 MG tablet Take  50 mg by mouth at bedtime as needed for sleep.     Marland Kitchen HYDROcodone-acetaminophen (NORCO) 5-325 MG tablet Take 1 tablet by mouth every 8 (eight) hours as needed for moderate pain. Do not drive while taking 30 tablet 0  . ibuprofen (ADVIL) 200 MG tablet Take 200 mg by mouth every 6 (six) hours as needed.    . naphazoline-glycerin (CLEAR EYES REDNESS) 0.012-0.2 % SOLN Place 1-2 drops into both eyes 4 (four) times daily as needed for eye irritation.     Marland Kitchen oxymetazoline (AFRIN) 0.05 % nasal spray Place 1 spray into both nostrils 2 (two) times daily as needed for congestion.     No current facility-administered medications for this encounter.    ALLERGIES: No Known Allergies   Physical Findings: Unable to assess given encounter type  Lab Findings: Lab Results  Component Value Date   WBC 6.7 03/22/2020   HGB 15.6 03/22/2020   HCT 46.6 03/22/2020   MCV 91.0 03/22/2020   PLT 176 03/22/2020     Radiographic Findings: No results found.  Impression/Plan: 1. Recurrent metastatic stage IIIB, cT3N1 inasive adenocarcinoma of the rectum.  Dr. Lisbeth Renshaw discusses the findings radiographically and reviews the patient's course since our last visit.  He discusses the rationale to consider stereotactic body radiotherapy (SBRT) to the left upper lobe lesion along the resection site.  He discusses that the goals would be definitive to treat these locations with curative intent.  We discussed the risks, benefits, short and long-term effects of therapy, and the patient is interested in proceeding, and Dr. Lisbeth Renshaw anticipates 3-5 fractions, likely 5 fractions due to the location.  He will come in for simulation on 12/01/2020 at which time he will sign written consent to proceed.    Given current concerns for patient exposure during the COVID-19 pandemic, this encounter was conducted via telephone.  The patient has provided two factor identification and has given verbal consent for this type of encounter and has been  advised to only accept a meeting of this type in a secure network environment. The time spent during this encounter was 60 minutes including preparation, discussion, and coordination of the patient's care. The attendants for this meeting include Blenda Nicely, RN, Dr. Lisbeth Renshaw, Hayden Pedro  and [name].  During the encounter,  Blenda Nicely, RN, Dr. Lisbeth Renshaw, and Hayden Pedro were located at [facility] Radiation Oncology Department.  Rolland Porter Churilla was located at in his truck  as he is working today.   The above documentation reflects my direct findings during this shared patient visit. Please see the separate note by Dr. Lisbeth Renshaw on this date for the remainder of the patient's plan of care.       Carola Rhine, PAC

## 2020-11-21 NOTE — Addendum Note (Signed)
Encounter addended by: Hayden Pedro, PA-C on: 11/21/2020 9:22 AM  Actions taken: Clinical Note Signed

## 2020-12-01 ENCOUNTER — Ambulatory Visit: Payer: 59 | Admitting: Radiation Oncology

## 2020-12-05 ENCOUNTER — Other Ambulatory Visit: Payer: Self-pay

## 2020-12-05 ENCOUNTER — Ambulatory Visit
Admission: RE | Admit: 2020-12-05 | Discharge: 2020-12-05 | Disposition: A | Discharge status: Home / Self Care | Payer: 59 | Source: Ambulatory Visit | Attending: Radiation Oncology | Admitting: Radiation Oncology

## 2020-12-05 DIAGNOSIS — C7802 Secondary malignant neoplasm of left lung: Secondary | ICD-10-CM | POA: Insufficient documentation

## 2020-12-14 DIAGNOSIS — C7802 Secondary malignant neoplasm of left lung: Secondary | ICD-10-CM | POA: Diagnosis not present

## 2020-12-16 ENCOUNTER — Other Ambulatory Visit: Payer: Self-pay

## 2020-12-16 ENCOUNTER — Ambulatory Visit
Admission: RE | Admit: 2020-12-16 | Discharge: 2020-12-16 | Disposition: A | Discharge status: Home / Self Care | Payer: 59 | Source: Ambulatory Visit | Attending: Radiation Oncology | Admitting: Radiation Oncology

## 2020-12-16 DIAGNOSIS — C7802 Secondary malignant neoplasm of left lung: Secondary | ICD-10-CM | POA: Insufficient documentation

## 2020-12-17 ENCOUNTER — Ambulatory Visit: Payer: 59 | Admitting: Radiation Oncology

## 2020-12-18 ENCOUNTER — Other Ambulatory Visit: Payer: Self-pay

## 2020-12-18 ENCOUNTER — Ambulatory Visit
Admission: RE | Admit: 2020-12-18 | Discharge: 2020-12-18 | Disposition: A | Discharge status: Home / Self Care | Payer: 59 | Source: Ambulatory Visit | Attending: Radiation Oncology | Admitting: Radiation Oncology

## 2020-12-18 DIAGNOSIS — C7802 Secondary malignant neoplasm of left lung: Secondary | ICD-10-CM | POA: Diagnosis not present

## 2020-12-19 ENCOUNTER — Ambulatory Visit: Payer: 59 | Admitting: Radiation Oncology

## 2020-12-19 ENCOUNTER — Other Ambulatory Visit: Payer: Self-pay | Admitting: Oncology

## 2020-12-19 DIAGNOSIS — C20 Malignant neoplasm of rectum: Secondary | ICD-10-CM

## 2020-12-19 MED ORDER — HYDROCODONE-ACETAMINOPHEN 5-325 MG PO TABS: 1.0000 | ORAL_TABLET | Freq: Three times a day (TID) | ORAL | 0 refills | Status: DC | PRN

## 2020-12-22 ENCOUNTER — Ambulatory Visit
Admission: RE | Admit: 2020-12-22 | Discharge: 2020-12-22 | Disposition: A | Discharge status: Home / Self Care | Payer: 59 | Source: Ambulatory Visit | Attending: Radiation Oncology | Admitting: Radiation Oncology

## 2020-12-22 ENCOUNTER — Other Ambulatory Visit: Payer: Self-pay

## 2020-12-22 DIAGNOSIS — C7802 Secondary malignant neoplasm of left lung: Secondary | ICD-10-CM | POA: Diagnosis not present

## 2020-12-24 ENCOUNTER — Ambulatory Visit
Admission: RE | Admit: 2020-12-24 | Discharge: 2020-12-24 | Disposition: A | Discharge status: Home / Self Care | Payer: 59 | Source: Ambulatory Visit | Attending: Radiation Oncology | Admitting: Radiation Oncology

## 2020-12-24 DIAGNOSIS — C7802 Secondary malignant neoplasm of left lung: Secondary | ICD-10-CM | POA: Diagnosis not present

## 2020-12-26 ENCOUNTER — Ambulatory Visit
Admission: RE | Admit: 2020-12-26 | Discharge: 2020-12-26 | Disposition: A | Discharge status: Home / Self Care | Payer: 59 | Source: Ambulatory Visit | Attending: Radiation Oncology | Admitting: Radiation Oncology

## 2020-12-26 ENCOUNTER — Other Ambulatory Visit: Payer: Self-pay

## 2020-12-26 DIAGNOSIS — C7802 Secondary malignant neoplasm of left lung: Secondary | ICD-10-CM | POA: Diagnosis not present

## 2020-12-31 ENCOUNTER — Encounter: Payer: Self-pay | Admitting: Radiation Oncology

## 2020-12-31 NOTE — Progress Notes (Signed)
  Radiation Oncology         (336) [phone] ________________________________  Name: Craig [name] MRN: [id number]  Date: 12/26/20  DOB: 1961/09/01  End of Treatment Note  Diagnosis:   Recurrent metastatic Stage IIIB, cT3N1 inasive adenocarcinoma of the rectum  Indication for treatment:  Curative       Radiation treatment dates:   12/16/20-12/26/20  Site/dose:   The two tumors in the LUL were treated with a course of stereotactic body radiation treatment. The patient received 50 Gy in 5 fractions at 10 Gy per fraction.  Narrative: The patient tolerated radiation treatment relatively well.   The patient did not have any signs of acute toxicity during treatment.  Plan: The patient will receive a call in about one month from the radiation oncology department. He will continue follow up with Dr. Benay Spice who will repeat staging CT scans to determine response to treatment as well.       Carola Rhine, PAC

## 2021-01-06 ENCOUNTER — Encounter: Payer: Self-pay | Admitting: *Deleted

## 2021-01-06 ENCOUNTER — Inpatient Hospital Stay: Payer: 59 | Attending: Nurse Practitioner | Admitting: Oncology

## 2021-01-06 NOTE — Progress Notes (Signed)
No show for f/u appointment today. Per Dr. Benay Spice: scheduling message sent for OV with MD or NP in 2-3 weeks

## 2021-01-07 ENCOUNTER — Telehealth: Payer: Self-pay | Admitting: Oncology

## 2021-01-07 NOTE — Telephone Encounter (Signed)
Scheduled appt per 2/22 sch msg - pt is aware of appt date and time on 3/15

## 2021-01-27 ENCOUNTER — Inpatient Hospital Stay: Payer: 59 | Attending: Nurse Practitioner | Admitting: Nurse Practitioner

## 2021-02-02 ENCOUNTER — Telehealth: Payer: Self-pay | Admitting: *Deleted

## 2021-02-02 NOTE — Telephone Encounter (Signed)
Left patient VM that he needs to be seen in office prior to any more refills.

## 2021-02-02 NOTE — Telephone Encounter (Signed)
Left message requesting refill on his hydrocodone-apap. Patient has been "no show" for last two appointments. Per Ned Card: will discuss refill at next visit.

## 2021-02-19 ENCOUNTER — Telehealth: Payer: Self-pay | Admitting: Oncology

## 2021-02-19 NOTE — Telephone Encounter (Signed)
Called pt per 4/7 voicemail. Left message for patient to call back to set up an appt.

## 2021-02-23 ENCOUNTER — Telehealth: Payer: Self-pay | Admitting: Oncology

## 2021-02-23 ENCOUNTER — Other Ambulatory Visit (HOSPITAL_COMMUNITY): Payer: Self-pay

## 2021-02-23 ENCOUNTER — Other Ambulatory Visit: Payer: Self-pay

## 2021-02-23 ENCOUNTER — Inpatient Hospital Stay: Payer: 59 | Attending: Nurse Practitioner | Admitting: Oncology

## 2021-02-23 VITALS — BP 110/86 | HR 76 | Temp 98.4°F | Resp 20 | Wt 197.8 lb

## 2021-02-23 DIAGNOSIS — C20 Malignant neoplasm of rectum: Secondary | ICD-10-CM | POA: Diagnosis present

## 2021-02-23 DIAGNOSIS — C7802 Secondary malignant neoplasm of left lung: Secondary | ICD-10-CM | POA: Diagnosis present

## 2021-02-23 DIAGNOSIS — Z923 Personal history of irradiation: Secondary | ICD-10-CM | POA: Diagnosis not present

## 2021-02-23 DIAGNOSIS — Z9221 Personal history of antineoplastic chemotherapy: Secondary | ICD-10-CM | POA: Diagnosis not present

## 2021-02-23 MED ORDER — HYDROCODONE-ACETAMINOPHEN 5-325 MG PO TABS
1.0000 | ORAL_TABLET | Freq: Three times a day (TID) | ORAL | 0 refills | Status: DC | PRN
  Filled 2021-02-23: qty 30, 10d supply, fill #0

## 2021-02-23 NOTE — Progress Notes (Signed)
Craig [name] OFFICE PROGRESS NOTE   Diagnosis: Rectal cancer  INTERVAL HISTORY:   Craig [name] completed SBRT to the left lung nodularity in February.  He has occasional discomfort at the left upper anterior chest.  No cough or dyspnea.  He continues to have irregular bowel habits.  He has pain with bowel movements.  He is followed by Dr. Dema Severin.  He reports Dr. Dema Severin recommends a permanent colostomy.  He would like a second opinion prior to proceeding with a colostomy.  Objective:  Vital signs in last 24 hours:  Blood pressure 110/86, pulse 76, temperature 98.4 F (36.9 C), resp. rate 20, weight 197 lb 12.8 oz (89.7 kg), SpO2 97 %.    Lymphatics: No cervical, supraclavicular, axillary, or inguinal nodes Resp: Lungs clear bilaterally Cardio: Regular rate and rhythm GI: No mass, no hepatosplenomegaly, nontender Vascular: No leg edema  Lab Results:  Lab Results  Component Value Date   WBC 6.7 03/22/2020   HGB 15.6 03/22/2020   HCT 46.6 03/22/2020   MCV 91.0 03/22/2020   PLT 176 03/22/2020   NEUTROABS 2.5 03/11/2020    CMP  Lab Results  Component Value Date   NA 138 06/23/2020   K 4.2 06/23/2020   CL 107 06/23/2020   CO2 24 06/23/2020   GLUCOSE 97 06/23/2020   BUN 12 06/23/2020   CREATININE 1.34 (H) 06/23/2020   CALCIUM 10.2 06/23/2020   PROT 7.5 03/11/2020   ALBUMIN 3.7 03/11/2020   AST 28 03/11/2020   ALT 41 03/11/2020   ALKPHOS 114 03/11/2020   BILITOT 0.9 03/11/2020   GFRNONAA 58 (L) 06/23/2020   GFRAA >60 06/23/2020    Lab Results  Component Value Date   CEA1 19.24 (H) 10/23/2020    Medications: I have reviewed the patient's current medications.   Assessment/Plan: 1. Rectal cancer ? Colonoscopy 09/08/2017, rectal mass at 5 cm from the anal verge, biopsy confirmed invasive adenocarcinoma ? Staging CTs 09/15/2017, rectal mass and perirectal lymphadenopathy, indeterminate right upper lobe nodule ? Elevated CEA ? MRI pelvis 10/07/2017-5.3  cm circumferential lower rectal mass with perirectal extension along the inferior mesenteric vein;T3, N1; distance from tumor to the anal sphincter is 5 cm. ? Initiation of radiation and concurrent Xeloda chemotherapy 10/12/2017;completion of radiation/Xeloda 11/23/2017. ? Surgery 02/01/18 per Dr. Nadeen Landau; laparoscopic lower anterior rectosigmoid resection, coloanal anastomosis, diverting loop ileostomy, flexible sigmoidoscopy; ypT3, pN0 ? Developed low-grade fever and abdominal pain on POD 4, CT [facility] on 02/06/18 and 02/09/18 shows small loculated fluid collections in RLQ and presacral space, consulted IR for transgluteal presacral abscess drain, treated with IV zosyn then transitioned to po cipro and flagyl ? Follow-up CT 03/24/2018-2.2 x 4.9 x 4.0 cm thick-walled presacral gas and fluid collection/abscess, minimally increased status post removal of surgical drain. No evidence of recurrent or metastatic disease. ? Cycle 1 adjuvant Xeloda 04/12/2018 ? Cycle 2 adjuvant Xeloda 05/03/2018 ? Cycle 3 adjuvant Xeloda 06/05/2018 (Xeloda dose reduced to 1500 mg twice daily secondary to mild neutropenia) ? Cycle 4adjuvant Xeloda 06/26/2018 ? Cycle 5 adjuvant Xeloda 07/18/2018 ? CTs 08/21/2018-no evidence of tumor recurrence or metastatic disease in the chest, abdomen or pelvis. Presacral soft tissue thickening and some calcifications compatible with postradiation changes. Stable probablybenign 3 mm right upper lobe nodule. ? CTs 09/18/2019-new 1.2 cm pulmonary nodule within the left upper lobe; stable 3 mm nodule right upper lobe. ? PET scan 10/03/2019-hypermetabolic left upper lobe nodule, no evidence of local recurrence, no other evidence of metastatic disease ?  Left video-assisted thoracoscopy, wedge resection 10/22/2019-left upper lobe wedge resection with metastatic adenocarcinoma (positive for cytokeratin 20 and CDX2, negative for cytokeratin 7 and TTF-1), no carcinoma identified in one lymph node,  margins uninvolved by carcinoma; 10 L lymph node biopsy with no carcinoma identified; left pleural biopsy with no carcinoma identified. ? Ileostomy takedown 03/20/2020 ? CTs 07/01/2020-nodularity left upper lobe at site of previous wedge resection, atypical appearance for simple postoperative change.  Soft tissue density about the ostomy takedown site. ? PET scan 07/62/2633-HLKT metabolic activity along the extraluminal locules of gas contained in the increasingly prominent presacral density.  Enlarging hypermetabolic nodules in the left upper lobe particularly along the wedge resection site.  Indistinct faintly accentuated right infrahilar and left retrohilar activity. ? SBRT to left lung nodules 12/16/2020-12/26/2020, 5 fractions  2.Pain and bleeding secondary to #1.Resolved.  3.Skin breakdown at the gluteal fold secondary to radiation. Resolved.  4.History ofneutropenia secondary to chemotherapy  5.  Possible rectal anastomotic leak confirmed on PET 10/14/2020 with extraluminal gas  Sigmoidoscopy 11/06/2020-no evidence of recurrent tumor, anastomotic "sinus "posteriorly    Disposition: Craig [name] has a history of metastatic rectal cancer.  He underwent SBRT to lung nodularity in February.  He will return for a CEA and restaging CTs in approximately 1 month.  I referred him to pelvic physical therapy.  He would like a second surgical opinion for management of the rectal pain and frequency.  I made a referral to Dr. Morton Stall.  Craig [name] understands he may need a permanent colostomy to help with the rectal frequency and discomfort.  Betsy Coder, MD  02/23/2021  12:27 PM

## 2021-02-23 NOTE — Telephone Encounter (Signed)
Scheduled appt per 4/11 los - gave patient AVS and calender

## 2021-02-24 ENCOUNTER — Encounter: Payer: Self-pay | Admitting: *Deleted

## 2021-02-24 NOTE — Progress Notes (Signed)
Faxed referral order, demographics, and chart information to Dr. Lenise Arena at [facility] [phone].

## 2021-02-26 ENCOUNTER — Inpatient Hospital Stay: Admission: RE | Admit: 2021-02-26 | Payer: 59 | Source: Ambulatory Visit

## 2021-02-26 ENCOUNTER — Encounter: Payer: Self-pay | Admitting: *Deleted

## 2021-02-26 NOTE — Progress Notes (Signed)
Faxed referral order, demographics and chart information to [facility] [phone]. Will have radiology push over his 03/23/21 scan once completed.

## 2021-03-02 ENCOUNTER — Inpatient Hospital Stay: Admission: RE | Admit: 2021-03-02 | Payer: 59 | Source: Ambulatory Visit

## 2021-03-02 DIAGNOSIS — C20 Malignant neoplasm of rectum: Secondary | ICD-10-CM

## 2021-03-02 DIAGNOSIS — R911 Solitary pulmonary nodule: Secondary | ICD-10-CM

## 2021-03-23 ENCOUNTER — Ambulatory Visit (HOSPITAL_BASED_OUTPATIENT_CLINIC_OR_DEPARTMENT_OTHER): Admission: RE | Admit: 2021-03-23 | Payer: 59 | Source: Ambulatory Visit

## 2021-03-23 ENCOUNTER — Inpatient Hospital Stay: Payer: 59

## 2021-03-24 ENCOUNTER — Inpatient Hospital Stay: Payer: 59 | Admitting: Oncology

## 2021-03-30 ENCOUNTER — Ambulatory Visit (HOSPITAL_BASED_OUTPATIENT_CLINIC_OR_DEPARTMENT_OTHER)
Admission: RE | Admit: 2021-03-30 | Discharge: 2021-03-30 | Disposition: A | Discharge status: Home / Self Care | Payer: 59 | Source: Ambulatory Visit | Attending: Oncology | Admitting: Oncology

## 2021-03-30 ENCOUNTER — Inpatient Hospital Stay: Payer: 59 | Attending: Nurse Practitioner

## 2021-03-30 ENCOUNTER — Other Ambulatory Visit: Payer: Self-pay

## 2021-03-30 DIAGNOSIS — Z79891 Long term (current) use of opiate analgesic: Secondary | ICD-10-CM | POA: Insufficient documentation

## 2021-03-30 DIAGNOSIS — C7802 Secondary malignant neoplasm of left lung: Secondary | ICD-10-CM | POA: Insufficient documentation

## 2021-03-30 DIAGNOSIS — G893 Neoplasm related pain (acute) (chronic): Secondary | ICD-10-CM | POA: Insufficient documentation

## 2021-03-30 DIAGNOSIS — Z902 Acquired absence of lung [part of]: Secondary | ICD-10-CM | POA: Insufficient documentation

## 2021-03-30 DIAGNOSIS — Z923 Personal history of irradiation: Secondary | ICD-10-CM | POA: Diagnosis not present

## 2021-03-30 DIAGNOSIS — C20 Malignant neoplasm of rectum: Secondary | ICD-10-CM | POA: Insufficient documentation

## 2021-03-30 DIAGNOSIS — Z9221 Personal history of antineoplastic chemotherapy: Secondary | ICD-10-CM | POA: Diagnosis not present

## 2021-03-30 LAB — BASIC METABOLIC PANEL - CANCER CENTER ONLY
Anion gap: 9 (ref 5–15)
BUN: 15 mg/dL (ref 6–20)
CO2: 24 mmol/L (ref 22–32)
Calcium: 9.4 mg/dL (ref 8.9–10.3)
Chloride: 106 mmol/L (ref 98–111)
Creatinine: 1.29 mg/dL — ABNORMAL HIGH (ref 0.61–1.24)
GFR, Estimated: >60 mL/min (ref >60)
Glucose, Bld: 107 mg/dL — ABNORMAL HIGH (ref 70–99)
Potassium: 3.9 mmol/L (ref 3.5–5.1)
Sodium: 139 mmol/L (ref 135–145)

## 2021-03-30 LAB — CEA (ACCESS): CEA (CHCC): 1.62 ng/mL (ref 0.00–5.00)

## 2021-03-30 MED ORDER — IOHEXOL 300 MG/ML  SOLN
100.0000 mL | Freq: Once | INTRAMUSCULAR | Status: AC | PRN
  Administered 2021-03-30: 100 mL via INTRAVENOUS

## 2021-03-31 ENCOUNTER — Inpatient Hospital Stay (HOSPITAL_BASED_OUTPATIENT_CLINIC_OR_DEPARTMENT_OTHER): Payer: 59 | Admitting: Oncology

## 2021-03-31 ENCOUNTER — Telehealth: Payer: Self-pay

## 2021-03-31 VITALS — BP 134/81 | HR 91 | Temp 98.0°F | Resp 18 | Ht 69.0 in | Wt 200.0 lb

## 2021-03-31 DIAGNOSIS — C20 Malignant neoplasm of rectum: Secondary | ICD-10-CM | POA: Diagnosis not present

## 2021-03-31 LAB — CEA (IN HOUSE-CHCC): CEA (CHCC-In House): 1.31 ng/mL (ref 0.00–5.00)

## 2021-03-31 MED ORDER — HYDROCODONE-ACETAMINOPHEN 5-325 MG PO TABS: 1.0000 | ORAL_TABLET | Freq: Three times a day (TID) | ORAL | 0 refills | Status: DC | PRN

## 2021-03-31 NOTE — Progress Notes (Signed)
[place] OFFICE PROGRESS NOTE   Diagnosis: Rectal cancer  INTERVAL HISTORY:   Mr. Gentles returns as scheduled.  Good appetite.  No dyspnea.  He continues to have irregular bowel habits and intermittent rectal pain.  He takes hydrocodone as needed in the evening.  He is working part-time.  He is scheduled to see Dr. Dema Severin next month.  He has an appoint with Dr. Morton Stall in July.  Objective:  Vital signs in last 24 hours:  Blood pressure 134/81, pulse 91, temperature 98 F (36.7 C), temperature source Oral, resp. rate 18, height 5\' 9"  (1.753 m), weight 200 lb (90.7 kg), SpO2 96 %.     Lymphatics: No cervical, supraclavicular, axillary, or inguinal nodes Resp: Lungs clear bilaterally Cardio: Regular rate and rhythm GI: No mass, no hepatosplenomegaly, nontender Vascular: No leg edema   Lab Results:  Lab Results  Component Value Date   WBC 6.7 03/22/2020   HGB 15.6 03/22/2020   HCT 46.6 03/22/2020   MCV 91.0 03/22/2020   PLT 176 03/22/2020   NEUTROABS 2.5 03/11/2020    CMP  Lab Results  Component Value Date   NA 139 03/30/2021   K 3.9 03/30/2021   CL 106 03/30/2021   CO2 24 03/30/2021   GLUCOSE 107 (H) 03/30/2021   BUN 15 03/30/2021   CREATININE 1.29 (H) 03/30/2021   CALCIUM 9.4 03/30/2021   PROT 7.5 03/11/2020   ALBUMIN 3.7 03/11/2020   AST 28 03/11/2020   ALT 41 03/11/2020   ALKPHOS 114 03/11/2020   BILITOT 0.9 03/11/2020   GFRNONAA >60 03/30/2021   GFRAA >60 06/23/2020    Lab Results  Component Value Date   CEA1 1.31 03/30/2021     Medications: I have reviewed the patient's current medications.   Assessment/Plan: 1. Rectal cancer ? Colonoscopy 09/08/2017, rectal mass at 5 cm from the anal verge, biopsy confirmed invasive adenocarcinoma ? Staging CTs 09/15/2017, rectal mass and perirectal lymphadenopathy, indeterminate right upper lobe nodule ? Elevated CEA ? MRI pelvis 10/07/2017-5.3 cm circumferential lower rectal mass with  perirectal extension along the inferior mesenteric vein;T3, N1; distance from tumor to the anal sphincter is 5 cm. ? Initiation of radiation and concurrent Xeloda chemotherapy 10/12/2017;completion of radiation/Xeloda 11/23/2017. ? Surgery 02/01/18 per Dr. Nadeen Landau; laparoscopic lower anterior rectosigmoid resection, coloanal anastomosis, diverting loop ileostomy, flexible sigmoidoscopy; ypT3, pN0 ? Developed low-grade fever and abdominal pain on POD 4, CT AP on 02/06/18 and 02/09/18 shows small loculated fluid collections in RLQ and presacral space, consulted IR for transgluteal presacral abscess drain, treated with IV zosyn then transitioned to po cipro and flagyl ? Follow-up CT 03/24/2018-2.2 x 4.9 x 4.0 cm thick-walled presacral gas and fluid collection/abscess, minimally increased status post removal of surgical drain. No evidence of recurrent or metastatic disease. ? Cycle 1 adjuvant Xeloda 04/12/2018 ? Cycle 2 adjuvant Xeloda 05/03/2018 ? Cycle 3 adjuvant Xeloda 06/05/2018 (Xeloda dose reduced to 1500 mg twice daily secondary to mild neutropenia) ? Cycle 4adjuvant Xeloda 06/26/2018 ? Cycle 5 adjuvant Xeloda 07/18/2018 ? CTs 08/21/2018-no evidence of tumor recurrence or metastatic disease in the chest, abdomen or pelvis. Presacral soft tissue thickening and some calcifications compatible with postradiation changes. Stable probablybenign 3 mm right upper lobe nodule. ? CTs 09/18/2019-new 1.2 cm pulmonary nodule within the left upper lobe; stable 3 mm nodule right upper lobe. ? PET scan 10/03/2019-hypermetabolic left upper lobe nodule, no evidence of local recurrence, no other evidence of metastatic disease ? Left video-assisted thoracoscopy, wedge resection 10/22/2019-left  upper lobe wedge resection with metastatic adenocarcinoma (positive for cytokeratin 20 and CDX2, negative for cytokeratin 7 and TTF-1), no carcinoma identified in one lymph node, margins uninvolved by carcinoma; 10 L lymph  node biopsy with no carcinoma identified; left pleural biopsy with no carcinoma identified. ? Ileostomy takedown 03/20/2020 ? CTs 07/01/2020-nodularity left upper lobe at site of previous wedge resection, atypical appearance for simple postoperative change.  Soft tissue density about the ostomy takedown site. ? PET scan 16/08/9603-VWUJ metabolic activity along the extraluminal locules of gas contained in the increasingly prominent presacral density.  Enlarging hypermetabolic nodules in the left upper lobe particularly along the wedge resection site.  Indistinct faintly accentuated right infrahilar and left retrohilar activity. ? SBRT to left lung nodules 12/16/2020-12/26/2020, 5 fractions  2.Pain and bleeding secondary to #1.Resolved.  3.Skin breakdown at the gluteal fold secondary to radiation. Resolved.  4.History ofneutropenia secondary to chemotherapy  5.  Possible rectal anastomotic leak confirmed on PET 10/14/2020 with extraluminal gas  Sigmoidoscopy 11/06/2020-no evidence of recurrent tumor, anastomotic "sinus "posteriorly    Disposition: Mr Kuenzel appears stable.  The CEA is normal.  He underwent restaging CTs earlier today.  The final report is pending.  I reviewed the images with Mr. Degraffenreid.  The nodular tumor in the left chest appears smaller.  The plan is to continue observation for the rectal cancer.  He will return for an office visit and CEA in approximately 6 weeks.  He will follow-up with Drs. White and Waters for [employer] of the rectum.  I refilled his prescription for hydrocodone.  Betsy Coder, MD  03/31/2021  1:54 PM

## 2021-03-31 NOTE — Telephone Encounter (Signed)
TC to [facility] per Dr Benay Spice to see if Pt's appointment can be moved up from 06/02/21. Per representative that is the earliest appointment available, but Pt will be put on cancellation list and will be called if there are any cancellations.

## 2021-04-01 ENCOUNTER — Telehealth: Payer: Self-pay

## 2021-04-01 NOTE — Telephone Encounter (Addendum)
TC to Pt informed Pt of CT results. Informed  Pt that there is no progression and the nodule is smaller.Pt responded" Is the cancer still there".  Informed Pt that the treatment is working because the nodule is getting smaller. Pt. Verbalized understanding. No further problems or concerns noted.

## 2021-04-01 NOTE — Telephone Encounter (Signed)
-----   Message from Ladell Pier, MD sent at 03/31/2021  4:49 PM EDT ----- Please call Craig [name], CT shows no evidence of progressive cancer, left upper lobe nodule is smaller  Copy CT report to Dr. Nadeen Landau

## 2021-04-10 ENCOUNTER — Telehealth: Payer: Self-pay | Admitting: *Deleted

## 2021-04-10 NOTE — Telephone Encounter (Signed)
Left VM yesterday and again today requesting return call to discuss his FMLA form to enable completion.

## 2021-04-15 ENCOUNTER — Encounter: Payer: Self-pay | Admitting: *Deleted

## 2021-04-15 NOTE — Progress Notes (Signed)
FMLA form completed and faxed to [place] att: Judie Petit at [phone]. Will provide patient a copy at [place] on 6/28 and copy to HIM to scan.

## 2021-05-12 ENCOUNTER — Inpatient Hospital Stay: Payer: Medicare Other | Admitting: Physician Assistant

## 2021-05-12 ENCOUNTER — Inpatient Hospital Stay: Payer: Medicare Other | Attending: Nurse Practitioner

## 2021-05-12 ENCOUNTER — Other Ambulatory Visit (HOSPITAL_BASED_OUTPATIENT_CLINIC_OR_DEPARTMENT_OTHER): Payer: Self-pay

## 2021-05-12 ENCOUNTER — Inpatient Hospital Stay (HOSPITAL_BASED_OUTPATIENT_CLINIC_OR_DEPARTMENT_OTHER): Payer: Medicare Other | Admitting: Physician Assistant

## 2021-05-12 ENCOUNTER — Other Ambulatory Visit: Payer: Self-pay

## 2021-05-12 DIAGNOSIS — Z79899 Other long term (current) drug therapy: Secondary | ICD-10-CM | POA: Diagnosis not present

## 2021-05-12 DIAGNOSIS — C7802 Secondary malignant neoplasm of left lung: Secondary | ICD-10-CM | POA: Diagnosis present

## 2021-05-12 DIAGNOSIS — C20 Malignant neoplasm of rectum: Secondary | ICD-10-CM

## 2021-05-12 LAB — CEA (ACCESS): CEA (CHCC): <1 ng/mL (ref 0.00–5.00)

## 2021-05-12 MED ORDER — HYDROCODONE-ACETAMINOPHEN 5-325 MG PO TABS: 1.0000 | ORAL_TABLET | Freq: Three times a day (TID) | ORAL | 0 refills | Status: DC | PRN

## 2021-05-12 NOTE — Progress Notes (Signed)
[place] OFFICE PROGRESS NOTE   Diagnosis: Rectal cancer  INTERVAL HISTORY:   Craig [name] returns for a follow up. He is unaccompanied for this visit. He reports that his energy and appetite are stable. He is able to complete all his ADLs on his own. He denies any nausea,  vomiting or abdominal pain. He continues to have rectal discomfort and loose stools with incontinence. He has noticed intermittent episodes of hematochezia. Patient notes occasional episodes of left lower quadrant abdominal discomfort. Patient denies any fevers, chills, night sweats, shortness of breath, chest pain or cough. He has no other complaints.   Past Medical History:  Diagnosis Date   Anxiety    Truck driver   Dysrhythmia    palpitations   Elevated serum creatinine    1.39 in 06/2012   Family history of cancer    GERD (gastroesophageal reflux disease)    History of kidney stones    Ileostomy in place [facility])    Palpitations    + chest pressure; PVCs   Rectal bleeding 01/07/2017   Rectal cancer ([place]) 09/30/2017   METASIZED TO LUNGS AND LYMPH NODES   Social History   Socioeconomic History   Marital status: Divorced    Spouse name: Not on file   Number of children: Not on file   Years of education: Not on file   Highest education level: Not on file  Occupational History   Occupation: Truck Geophysicist/field seismologist  Tobacco Use   Smoking status: Former    Pack years: 0.00   Smokeless tobacco: Never   Tobacco comments:    quit at age 89  Vaping Use   Vaping Use: Never used  Substance and Sexual Activity   Alcohol use: Not Currently    Comment: occ- none since 08/2017   Drug use: No   Sexual activity: Not on file  Other Topics Concern   Not on file  Social History Narrative   Not on file   Social Determinants of Health   Financial Resource Strain: Not on file  Food Insecurity: Not on file  Transportation Needs: Not on file  Physical Activity: Not on file  Stress: Not on file  Social  Connections: Not on file   Past Surgical History:  Procedure Laterality Date   COLONOSCOPY N/A 09/08/2017   Procedure: COLONOSCOPY;  Surgeon: Rogene Houston, MD;  [facility];  Service: Endoscopy;  Laterality: N/A;   COLONOSCOPY N/A 12/14/2018   Procedure: COLONOSCOPY;  Surgeon: Rogene Houston, MD;  Location: [facility];  Service: Endoscopy;  Laterality: N/A;  [place], URETEROSCOPY AND STENT PLACEMENT Bilateral 02/01/2018   Procedure: CYSTOSCOPY WITH RETROGRADE PYELOGRAM, URETEROSCOPY AND URETERAL CATHETERS BILATERAL;  Surgeon: Alexis Frock, MD;  Location: [facility];  Service: Urology;  Laterality: Bilateral;   FLEXIBLE SIGMOIDOSCOPY N/A 09/13/2018   Procedure: FLEXIBLE SIGMOIDOSCOPY;  Surgeon: Ileana Roup, MD;  Location: [facility];  Service: General;  Laterality: N/A;   FLEXIBLE SIGMOIDOSCOPY N/A 05/30/2019   Procedure: Beryle Quant;  Surgeon: Ileana Roup, MD;  Location: [facility];  Service: General;  Laterality: N/A;   FLEXIBLE SIGMOIDOSCOPY N/A 11/06/2020   Procedure: DIAGNOSTIC FLEXIBLE SIGMOIDOSCOPY;  Surgeon: Ileana Roup, MD;  [facility];  Service: General;  Laterality: N/A;   ILEOSTOMY CLOSURE N/A 03/20/2020   Procedure: ILEOSTOMY TAKEDOWN;  Surgeon: Ileana Roup, MD;  Location[facility];  Service: General;  Laterality: N/A;   LAPAROSCOPIC LOW ANTERIOR RESECTION N/A  02/01/2018   Procedure: LAPAROSCOPIC  LOW ANTERIOR RECTOSIGMOID RESECTION, COLOANAL ANASTOMOSIS;  DIVERTING LOOP ILESTOMY; FLEXIBLE SIGMOIDOSCOPY;  Surgeon: Ileana Roup, MD;  Location: [facility];  Service: General;  Laterality: N/A;   VIDEO ASSISTED THORACOSCOPY (VATS)/WEDGE RESECTION Left 10/22/2019   Procedure: VIDEO ASSISTED THORACOSCOPY (VATS) with mini thoracotomy/Left Upper Lobe Wedge RESECTION x2, left pleural biopsy, lymph node sampling, intercoastal nerve block;  Surgeon: Grace Isaac, MD;  Location:  [facility];  Service: Thoracic;  Laterality: Left;   VIDEO BRONCHOSCOPY N/A 10/22/2019   Procedure: VIDEO BRONCHOSCOPY using anesthesia scope;  Surgeon: Grace Isaac, MD;  Location: [facility];  Service: Thoracic;  Laterality: N/A;   Family History  Problem Relation Age of Onset   Cancer Mother 79       Lung cancer   Leukemia Sister 50   Cancer Maternal Aunt     Current Outpatient Medications:    acetaminophen (TYLENOL) 500 MG tablet, Take 1,000 mg by mouth every 6 (six) hours as needed for moderate pain or headache., Disp: , Rfl:    aspirin-sod bicarb-citric acid (ALKA-SELTZER) 325 MG TBEF tablet, Take 650 mg by mouth every 6 (six) hours as needed (heartburn)., Disp: , Rfl:    diphenhydrAMINE (SOMINEX) 25 MG tablet, Take 50 mg by mouth at bedtime as needed for sleep. , Disp: , Rfl:    HYDROcodone-acetaminophen (NORCO) 5-325 MG tablet, Take 1 tablet by mouth every 8 (eight) hours as needed for moderate pain. Do not drive while taking, Disp: 30 tablet, Rfl: 0   ibuprofen (ADVIL) 200 MG tablet, Take 200 mg by mouth every 6 (six) hours as needed., Disp: , Rfl:    naphazoline-glycerin (CLEAR EYES REDNESS) 0.012-0.2 % SOLN, Place 1-2 drops into both eyes 4 (four) times daily as needed for eye irritation. , Disp: , Rfl:    oxymetazoline (AFRIN) 0.05 % nasal spray, Place 1 spray into both nostrils 2 (two) times daily as needed for congestion., Disp: , Rfl:   REVIEW OF SYSTEMS: Review of Systems  Constitutional:  Negative for chills, fever, malaise/fatigue and weight loss.  Respiratory:  Negative for cough, shortness of breath and wheezing.   Cardiovascular:  Negative for chest pain and leg swelling.  Gastrointestinal:  Positive for blood in stool. Negative for abdominal pain, constipation, diarrhea, nausea and vomiting.  Skin:  Negative for rash.  Neurological:  Negative for dizziness.  Endo/Heme/Allergies:  Does not bruise/bleed easily.   Objective: Performance Status: ECOG 1 Vitals: Blood  pressure 120/85, pulse 83, temperature 98 F (36.7 C), temperature source Oral, resp. rate 18, height 5\' 9"  (1.753 m), weight 200 lb (90.7 kg), SpO2 99 %. ENT: No oral ulcerations.   Lymphatics: No cervical, supraclavicular, axillary, or inguinal nodes Resp: Lungs clear bilaterally Cardio: Regular rate and rhythm GI: No mass, no hepatosplenomegaly, nontender Vascular: No leg edema   Lab Results:  Lab Results  Component Value Date   WBC 6.7 03/22/2020   HGB 15.6 03/22/2020   HCT 46.6 03/22/2020   MCV 91.0 03/22/2020   PLT 176 03/22/2020   NEUTROABS 2.5 03/11/2020    CMP  Lab Results  Component Value Date   NA 139 03/30/2021   K 3.9 03/30/2021   CL 106 03/30/2021   CO2 24 03/30/2021   GLUCOSE 107 (H) 03/30/2021   BUN 15 03/30/2021   CREATININE 1.29 (H) 03/30/2021   CALCIUM 9.4 03/30/2021   PROT 7.5 03/11/2020   ALBUMIN 3.7 03/11/2020   AST 28 03/11/2020   ALT 41 03/11/2020  ALKPHOS 114 03/11/2020   BILITOT 0.9 03/11/2020   GFRNONAA >60 03/30/2021   GFRAA >60 06/23/2020    Lab Results  Component Value Date   CEA1 1.31 03/30/2021     Assessment/Plan: Rectal cancer Colonoscopy 09/08/2017, rectal mass at 5 cm from the anal verge, biopsy confirmed invasive adenocarcinoma Staging CTs 09/15/2017, rectal mass and perirectal lymphadenopathy, indeterminate right upper lobe nodule Elevated CEA MRI pelvis 10/07/2017-5.3 cm circumferential lower rectal mass with perirectal extension along the inferior mesenteric vein;  T3, N1;  distance from tumor to the anal sphincter is 5 cm. Initiation of radiation and concurrent Xeloda chemotherapy 10/12/2017; completion of radiation/Xeloda 11/23/2017. Surgery 02/01/18 per Dr. Nadeen Landau; laparoscopic lower anterior rectosigmoid resection, coloanal anastomosis, diverting loop ileostomy, flexible sigmoidoscopy; ypT3, pN0 Developed low-grade fever and abdominal pain on POD 4, CT [facility] on 02/06/18 and 02/09/18 shows small loculated fluid  collections in RLQ and presacral space, consulted IR for transgluteal presacral abscess drain, treated with IV zosyn then transitioned to po cipro and flagyl Follow-up CT 03/24/2018- 2.2 x 4.9 x 4.0 cm thick-walled presacral gas and fluid collection/abscess, minimally increased status post removal of surgical drain.  No evidence of recurrent or metastatic disease. Cycle 1 adjuvant Xeloda 04/12/2018 Cycle 2 adjuvant Xeloda 05/03/2018 Cycle 3 adjuvant Xeloda 06/05/2018 (Xeloda dose reduced to 1500 mg twice daily secondary to mild neutropenia) Cycle 4 adjuvant Xeloda 06/26/2018 Cycle 5 adjuvant Xeloda 07/18/2018 CTs 08/21/2018- no evidence of tumor recurrence or metastatic disease in the chest, abdomen or pelvis.  Presacral soft tissue thickening and some calcifications compatible with postradiation changes.  Stable probably benign 3 mm right upper lobe nodule. CTs 09/18/2019-new 1.2 cm pulmonary nodule within the left upper lobe; stable 3 mm nodule right upper lobe. PET scan 10/03/2019-hypermetabolic left upper lobe nodule, no evidence of local recurrence, no other evidence of metastatic disease Left video-assisted thoracoscopy, wedge resection 10/22/2019-left upper lobe wedge resection with metastatic adenocarcinoma (positive for cytokeratin 20 and CDX2, negative for cytokeratin 7 and TTF-1), no carcinoma identified in one lymph node, margins uninvolved by carcinoma; 10 L lymph node biopsy with no carcinoma identified; left pleural biopsy with no carcinoma identified. Ileostomy takedown 03/20/2020 CTs 07/01/2020-nodularity left upper lobe at site of previous wedge resection, atypical appearance for simple postoperative change.  Soft tissue density about the ostomy takedown site. PET scan 56/43/3295-JOAC metabolic activity along the extraluminal locules of gas contained in the increasingly prominent presacral density.  Enlarging hypermetabolic nodules in the left upper lobe particularly along the wedge resection site.   Indistinct faintly accentuated right infrahilar and left retrohilar activity. SBRT to left lung nodules 12/16/2020-12/26/2020, 5 fractions   2.  Pain and bleeding secondary to #1. Resolved.   3.  Skin breakdown at the gluteal fold secondary to radiation.  Resolved.   4.  History of neutropenia secondary to chemotherapy  5.  Possible rectal anastomotic leak confirmed on PET 10/14/2020 with extraluminal gas Sigmoidoscopy 11/06/2020-no evidence of recurrent tumor, anastomotic "sinus "posteriorly   Disposition: Craig [name] returns today for a follow up. CEA today was within normal limits. Prior CT scan from 03/31/2021 revealed no evidence of recurrence and decreased nodularity in LUL along resection margin following radiation treatment.  Patient continues to have rectal pain with loose stools and incontinence. He is scheduled to follow up with Dr.Waters at [facility] on 06/02/2021. I will refill prescription for hydrocodone-acetaminophen. Patient will return to clinic after visit with Dr. Morton Stall around beginning of August.   Patient expressed  understanding and satisfaction with the plan provided.   I have spent a total of 25 minutes minutes of face-to-face and non-face-to-face time, preparing to see the patient, obtaining and/or reviewing separately obtained history, performing a medically appropriate examination, counseling and educating the patient, ordering medications, documenting clinical information in the electronic health record, and care coordination.    Lincoln Brigham, PA-C [facility] [place]  05/12/2021  11:30 AM

## 2021-05-13 LAB — CEA (IN HOUSE-CHCC): CEA (CHCC-In House): 1 ng/mL (ref 0.00–5.00)

## 2021-05-21 ENCOUNTER — Telehealth: Payer: Self-pay

## 2021-05-21 NOTE — Telephone Encounter (Signed)
VM message left informing Pt of normal CEA results. Pt informed to return call if any problems or concerns.

## 2021-05-21 NOTE — Telephone Encounter (Signed)
-----   Message from Ladell Pier, MD sent at 05/15/2021  4:57 PM EDT ----- Please call patient, CEA is normal, follow-up as scheduled

## 2021-06-05 ENCOUNTER — Telehealth: Payer: Self-pay

## 2021-06-05 NOTE — Telephone Encounter (Signed)
TC from Pt stating he had an appointment scheduled with Dr Morton Stall at [facility]. Pt stated he went to [facility] and his appointment was in [place]. Pt stated he didn't know when his next appointment would be with Dr Morton Stall. TC to [facility] to see if appointment could be obtained. Spoke with scheduling who stated Pt's appointment was scheduled for  06/30/21 @230  Informed Pt of appointment time and confirmed address for appointment in [place]. Pt verbalized understanding. No further problems or concerns noted.

## 2021-06-08 ENCOUNTER — Telehealth: Payer: Self-pay | Admitting: Oncology

## 2021-06-08 NOTE — Telephone Encounter (Signed)
Patient called stating he was having chest pain and I instructed him  to go the ED or call 911 per Noah Delaine, RN

## 2021-06-09 ENCOUNTER — Encounter (HOSPITAL_COMMUNITY): Payer: Self-pay

## 2021-06-09 ENCOUNTER — Emergency Department (HOSPITAL_COMMUNITY): Payer: Medicare Other

## 2021-06-09 ENCOUNTER — Other Ambulatory Visit: Payer: Self-pay

## 2021-06-09 ENCOUNTER — Emergency Department (HOSPITAL_COMMUNITY)
Admission: EM | Admit: 2021-06-09 | Discharge: 2021-06-09 | Disposition: A | Discharge status: Home / Self Care | Payer: Medicare Other | Attending: Emergency Medicine | Admitting: Emergency Medicine

## 2021-06-09 DIAGNOSIS — R079 Chest pain, unspecified: Secondary | ICD-10-CM | POA: Diagnosis not present

## 2021-06-09 DIAGNOSIS — Z5321 Procedure and treatment not carried out due to patient leaving prior to being seen by health care provider: Secondary | ICD-10-CM | POA: Insufficient documentation

## 2021-06-09 DIAGNOSIS — Z85118 Personal history of other malignant neoplasm of bronchus and lung: Secondary | ICD-10-CM | POA: Diagnosis not present

## 2021-06-09 LAB — CBC WITH DIFFERENTIAL/PLATELET
Abs Immature Granulocytes: 0.01 10*3/uL (ref 0.00–0.07)
Basophils Absolute: 0 10*3/uL (ref 0.0–0.1)
Basophils Relative: 1 %
Eosinophils Absolute: 0.1 10*3/uL (ref 0.0–0.5)
Eosinophils Relative: 4 %
HCT: 45.6 % (ref 39.0–52.0)
Hemoglobin: 15.2 g/dL (ref 13.0–17.0)
Immature Granulocytes: 0 %
Lymphocytes Relative: 24 %
Lymphs Abs: 0.7 10*3/uL (ref 0.7–4.0)
MCH: 30.6 pg (ref 26.0–34.0)
MCHC: 33.3 g/dL (ref 30.0–36.0)
MCV: 91.8 fL (ref 80.0–100.0)
Monocytes Absolute: 0.3 10*3/uL (ref 0.1–1.0)
Monocytes Relative: 12 %
Neutro Abs: 1.6 10*3/uL — ABNORMAL LOW (ref 1.7–7.7)
Neutrophils Relative %: 59 %
Platelets: 232 10*3/uL (ref 150–400)
RBC: 4.97 MIL/uL (ref 4.22–5.81)
RDW: 13.2 % (ref 11.5–15.5)
WBC: 2.8 10*3/uL — ABNORMAL LOW (ref 4.0–10.5)
nRBC: 0 % (ref 0.0–0.2)

## 2021-06-09 LAB — BASIC METABOLIC PANEL
Anion gap: 11 (ref 5–15)
BUN: 15 mg/dL (ref 6–20)
CO2: 25 mmol/L (ref 22–32)
Calcium: 10.3 mg/dL (ref 8.9–10.3)
Chloride: 107 mmol/L (ref 98–111)
Creatinine, Ser: 1.12 mg/dL (ref 0.61–1.24)
GFR, Estimated: >60 mL/min (ref >60)
Glucose, Bld: 90 mg/dL (ref 70–99)
Potassium: 4.3 mmol/L (ref 3.5–5.1)
Sodium: 143 mmol/L (ref 135–145)

## 2021-06-09 LAB — TROPONIN I (HIGH SENSITIVITY): Troponin I (High Sensitivity): 3 ng/L (ref <18)

## 2021-06-09 IMAGING — CT CT ABD-PELV W/ CM
2 of 5 series · 14 of 46 positions shown, 16 images · IV contrast (iopamidol)
Comparison: CT chest abdomen pelvis dated 08/21/2018.

CLINICAL DATA: Preop for ileostomy take-down. History of rectal
cancer resected on 02/01/2018.

EXAM:
CT CHEST, ABDOMEN, AND PELVIS WITH CONTRAST
TECHNIQUE: Multidetector CT imaging of the chest, abdomen and pelvis was
performed following the standard protocol during bolus
administration of intravenous contrast.
CONTRAST:  125mL 5IYAHS-1X7 IOPAMIDOL (5IYAHS-1X7) INJECTION 76%

[Series 2: cap with 5.00 br40 s3 axial · axial · 0.81mm/px · z∈[+1251,+1856]mm · 11 of 145 slices shown, 13 images]
[im 12/145  soft-tissue]
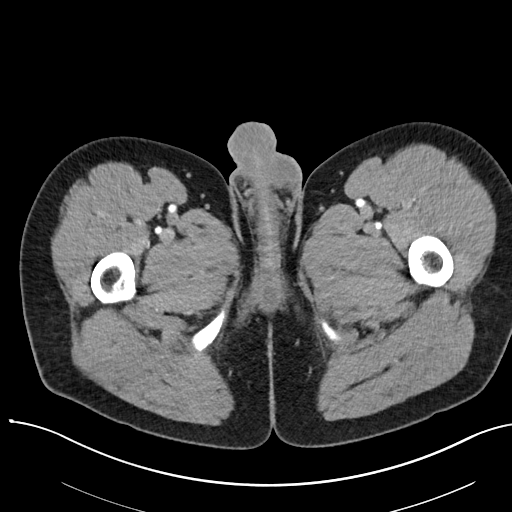
[im 12/145  bone]
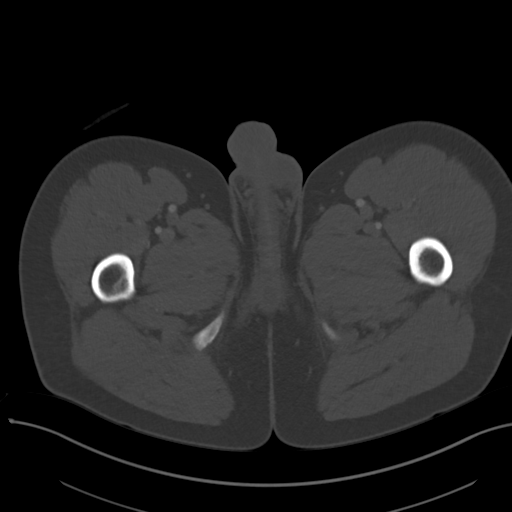
[im 23/145  soft-tissue]
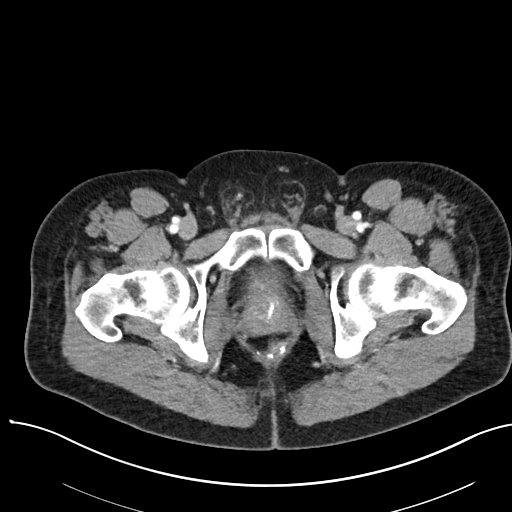
[im 34/145  soft-tissue]
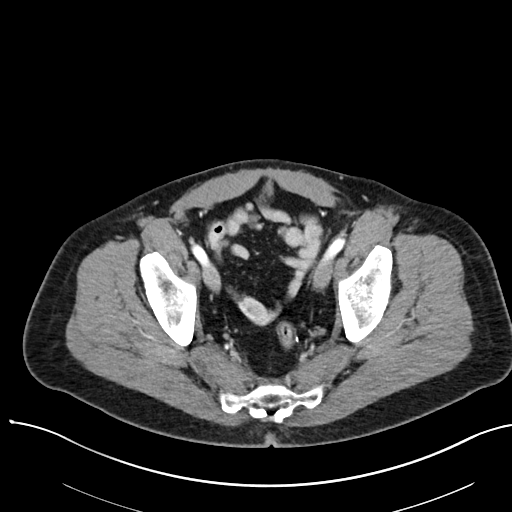
[im 45/145  soft-tissue]
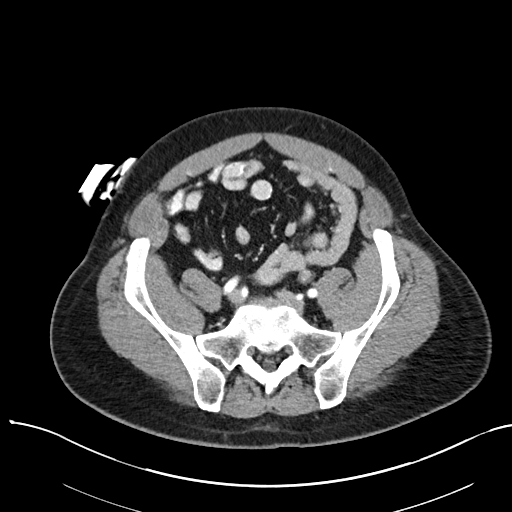
[im 56/145  soft-tissue]
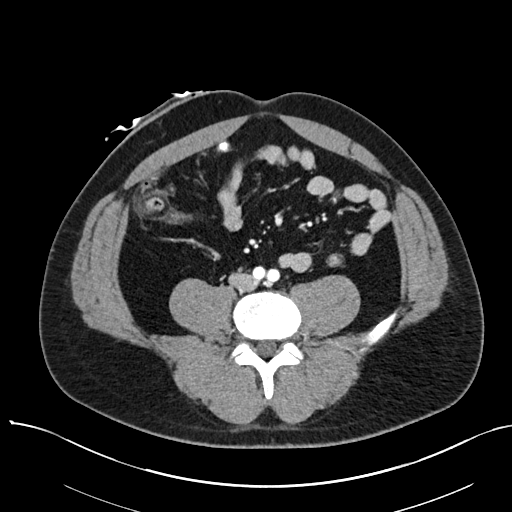
[im 78/145  soft-tissue]
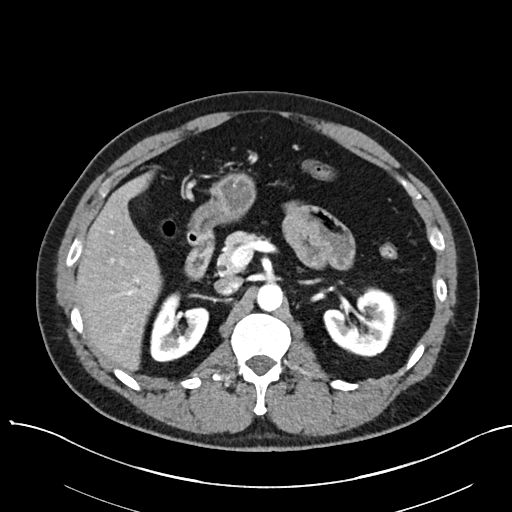
[im 89/145  soft-tissue]
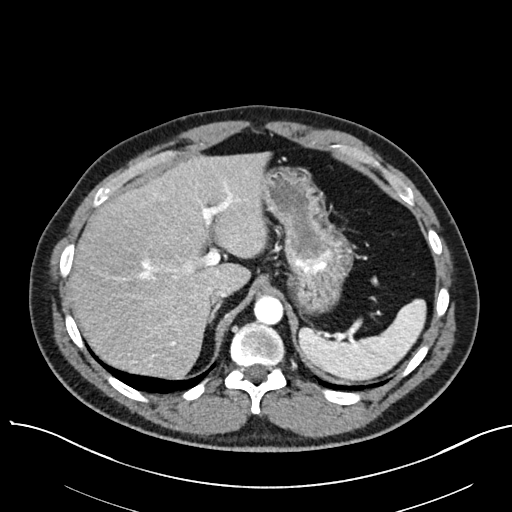
[im 100/145  soft-tissue]
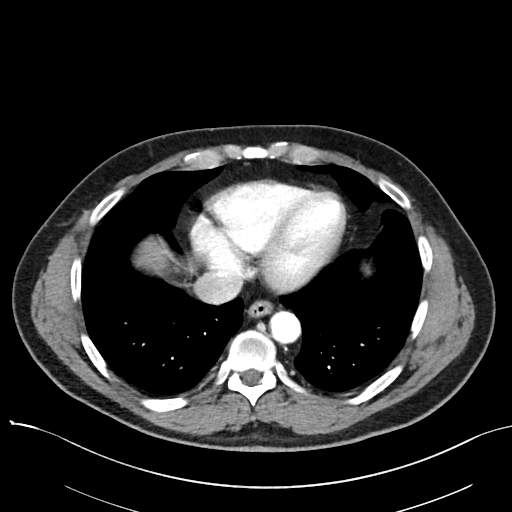
[im 111/145  soft-tissue]
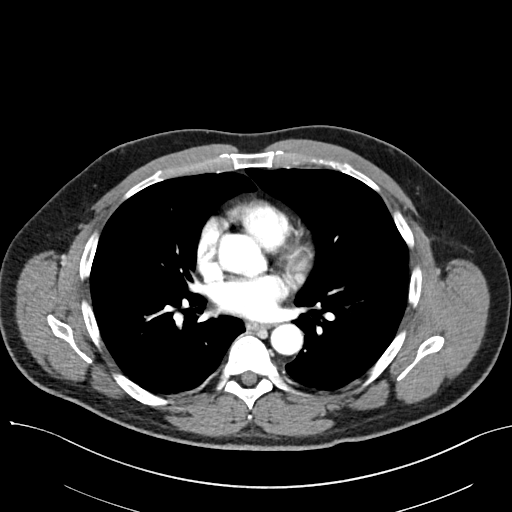
[im 111/145  bone]
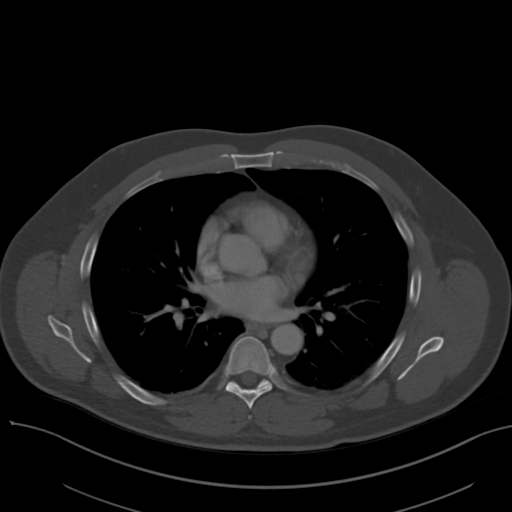
[im 122/145  soft-tissue]
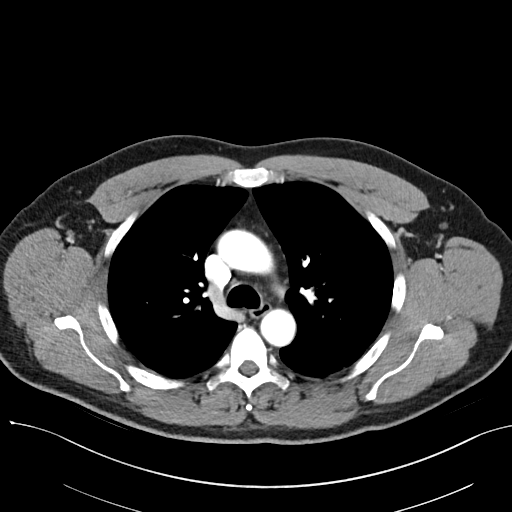
[im 133/145  soft-tissue]
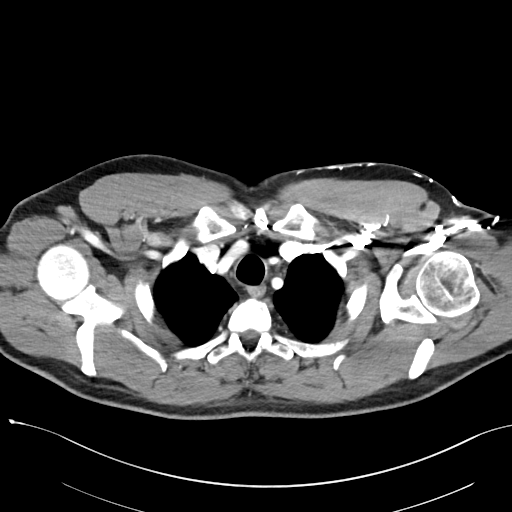

[Series 6: cap with 2.00 br40 s3 cor · coronal · 0.81mm/px · 3 of 186 slices shown]
[im 62/186  soft-tissue]
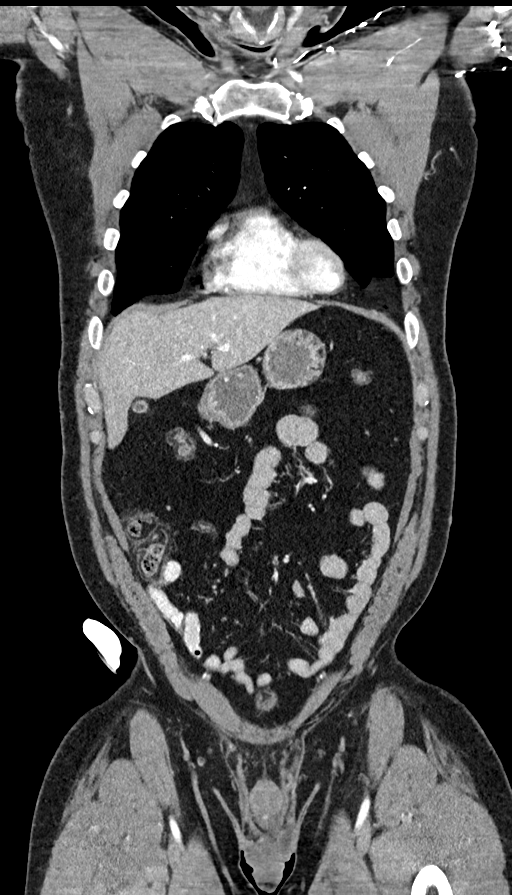
[im 83/186  soft-tissue]
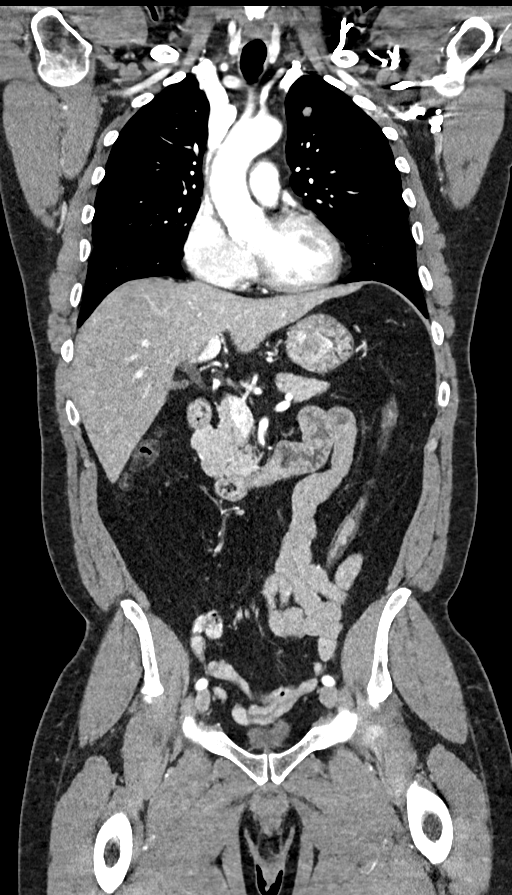
[im 103/186  soft-tissue]
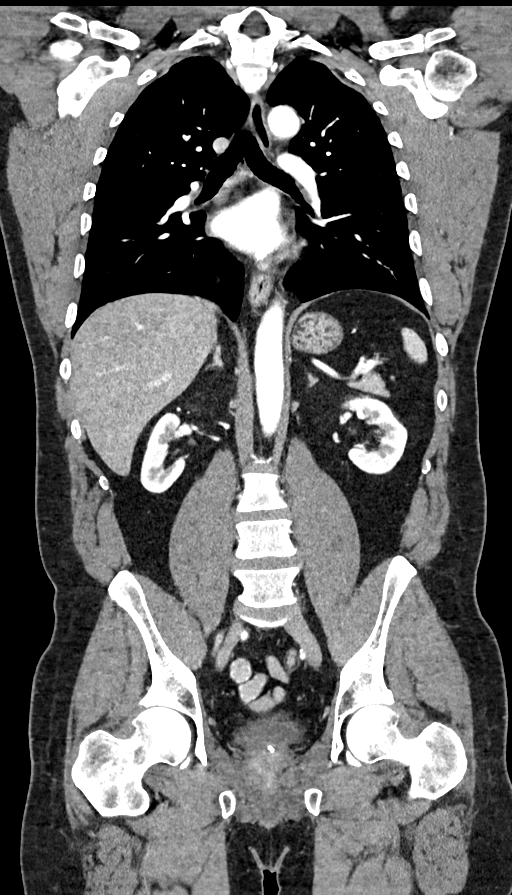

[14 of 46 positions shown; findings below may reference images not displayed]

FINDINGS: CT CHEST FINDINGS

Cardiovascular: Heart size is normal. No pericardial effusion. No
thoracic aortic aneurysm or evidence of aortic dissection.

Mediastinum/Nodes: No mass or enlarged lymph nodes seen within the
mediastinum, perihilar or axillary regions. Esophagus appears
normal. Trachea and central bronchi are unremarkable.

Lungs/Pleura: New 1.2 cm nodule within the LEFT upper lobe (series
4, image 57). Stable 3 mm nodule within the RIGHT upper lobe (image
63). Lungs are otherwise clear.

Musculoskeletal: No acute or suspicious osseous finding.

CT ABDOMEN PELVIS FINDINGS

Hepatobiliary: No focal mass or lesion is seen within the liver.
Subtle low-density area within the RIGHT liver lobe is likely
artifactual or related to geographic fatty infiltration. Gallbladder
appears normal. No bile duct dilatation.

Pancreas: Unremarkable. No pancreatic ductal dilatation or
surrounding inflammatory changes.

Spleen: Normal in size without focal abnormality.

Adrenals/Urinary Tract: Adrenal glands appear normal. Kidneys are
unremarkable without mass, stone or hydronephrosis. Bladder is
decompressed.

Stomach/Bowel: Surgical changes at the rectum, compatible with the
given history of rectal resection. Associated chronic presacral
thickening. No obstruction, inflammation or other complicating
features at the surgical anastomosis.

No dilated large or small bowel loops. RIGHT lower quadrant ostomy
in place. Appendix is normal. Stomach is unremarkable, partially
decompressed.

Vascular/Lymphatic: No vascular abnormality. No enlarged lymph nodes
seen in the abdomen or pelvis.

Reproductive: Prostate is unremarkable.

Other: No free fluid or abscess collection seen. No free
intraperitoneal air.

Musculoskeletal: No acute or suspicious osseous finding.
IMPRESSION: 1. New 1.2 cm pulmonary nodule within the LEFT upper lobe (series 4,
image 57), new compared to the chest CT of 08/21/2018, highly
suspicious for neoplastic process (favor metastatic disease).
Recommend further characterization with PET-CT and/or tissue
sampling.
2. Surgical anastomosis at the rectum. No obstruction, inflammation
or other complicating feature at the surgical anastomosis.
3. RIGHT lower quadrant ostomy in place. No bowel obstruction or
inflammation.
4. Remainder of the chest, abdomen and pelvis CT is unremarkable, as
detailed above. No bowel obstruction. No evidence of additional
metastasis within the abdomen or pelvis.

These results will be called to the ordering clinician or
representative by the Radiologist Assistant, and communication
documented in the PACS or zVision Dashboard.

## 2021-06-09 IMAGING — CT CT CHEST W/ CM
2 of 5 series · 14 of 46 positions shown, 16 images · IV contrast (iopamidol)
Comparison: CT chest abdomen pelvis dated 08/21/2018.

CLINICAL DATA: Preop for ileostomy take-down. History of rectal
cancer resected on 02/01/2018.

EXAM:
CT CHEST, ABDOMEN, AND PELVIS WITH CONTRAST
TECHNIQUE: Multidetector CT imaging of the chest, abdomen and pelvis was
performed following the standard protocol during bolus
administration of intravenous contrast.
CONTRAST:  125mL 5IYAHS-1X7 IOPAMIDOL (5IYAHS-1X7) INJECTION 76%

[Series 2: cap with 5.00 br40 s3 axial · axial · 0.81mm/px · z∈[+1251,+1856]mm · 11 of 145 slices shown, 13 images]
[im 12/145  soft-tissue]
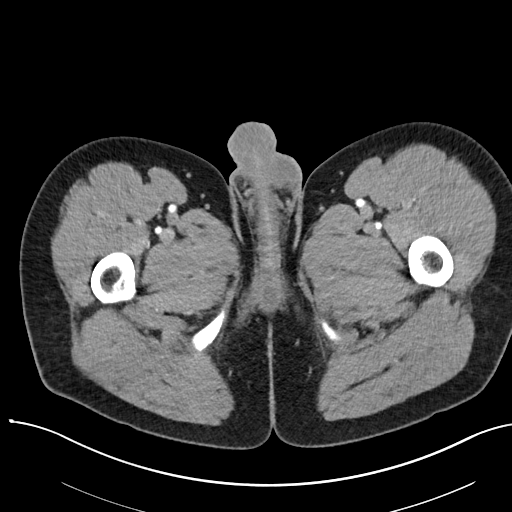
[im 12/145  bone]
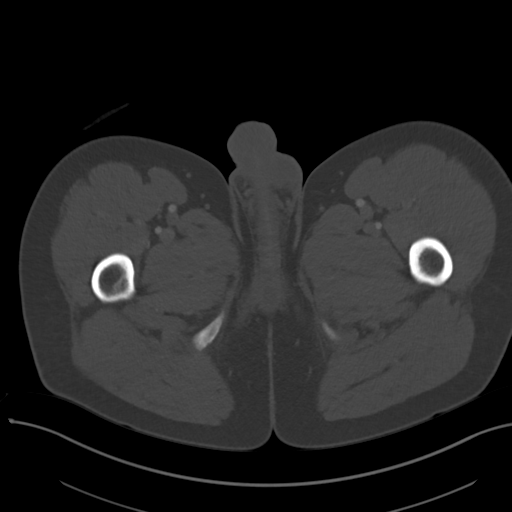
[im 23/145  soft-tissue]
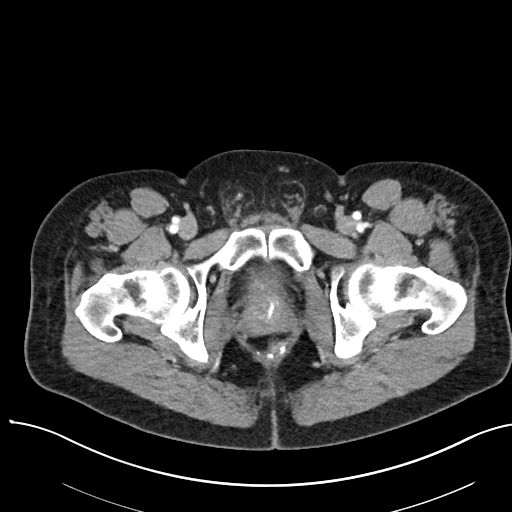
[im 34/145  soft-tissue]
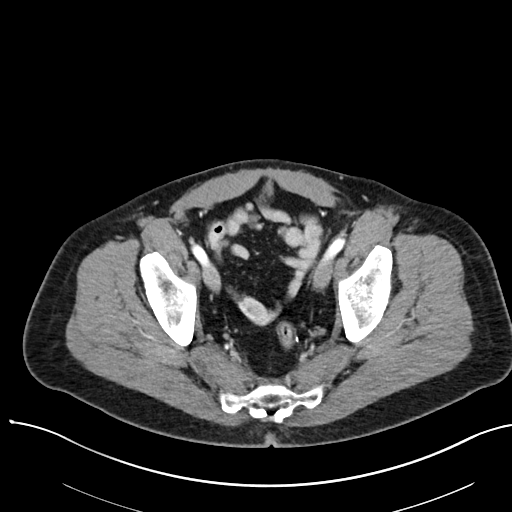
[im 45/145  soft-tissue]
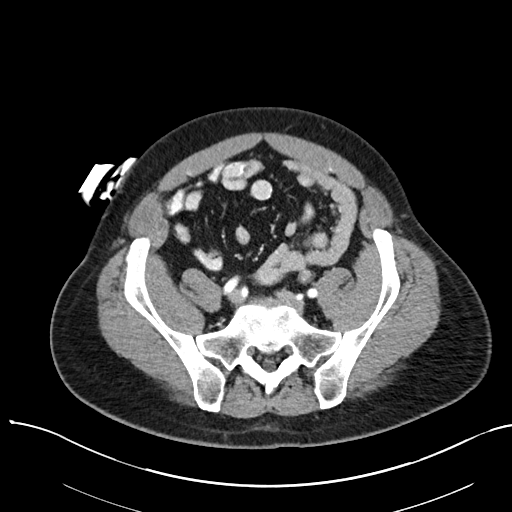
[im 56/145  soft-tissue]
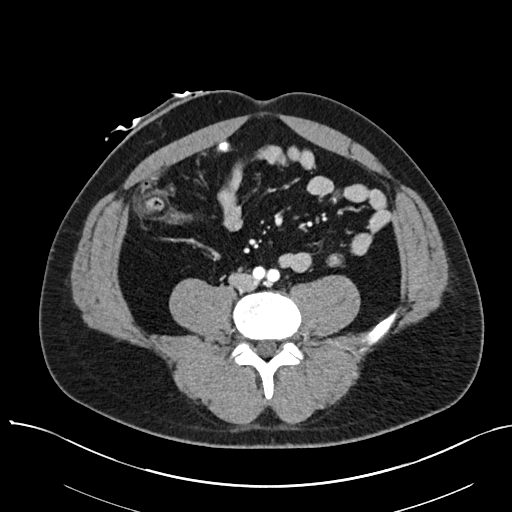
[im 78/145  soft-tissue]
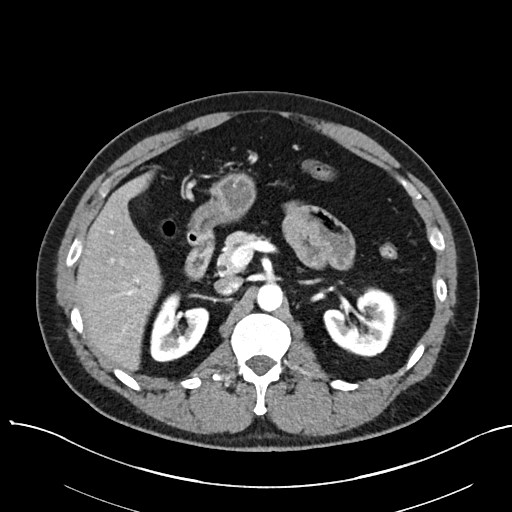
[im 89/145  soft-tissue]
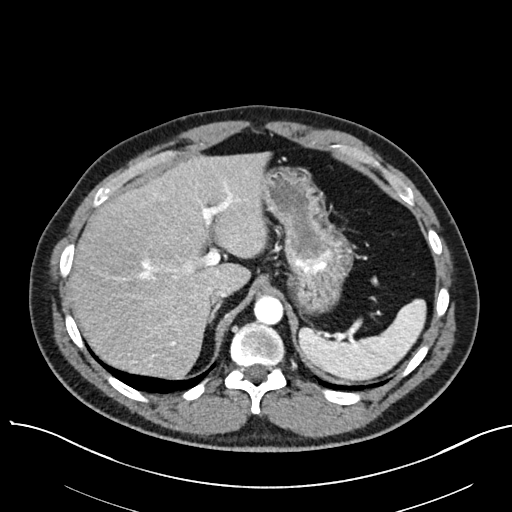
[im 100/145  soft-tissue]
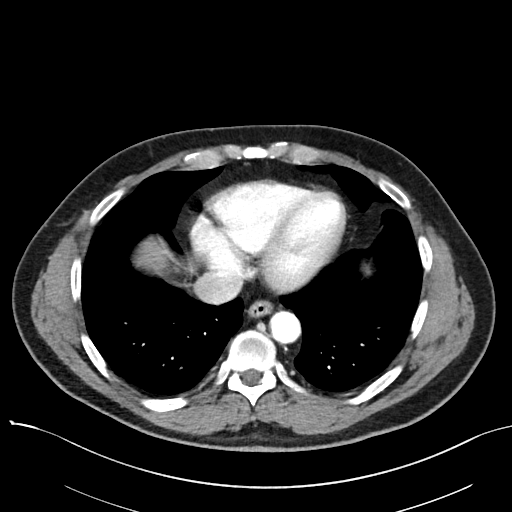
[im 111/145  soft-tissue]
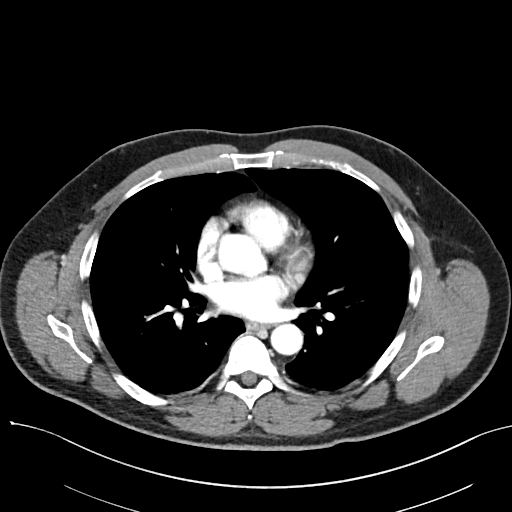
[im 111/145  bone]
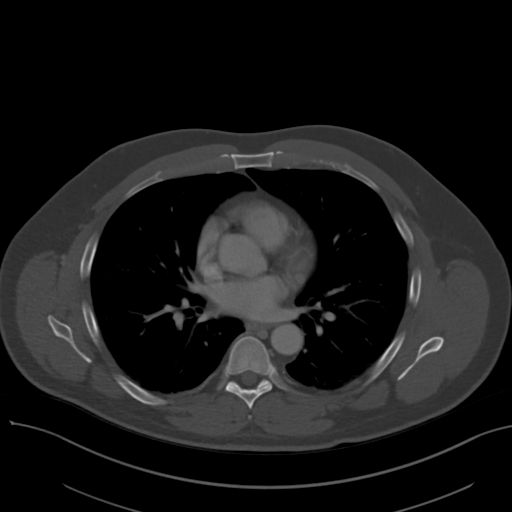
[im 122/145  soft-tissue]
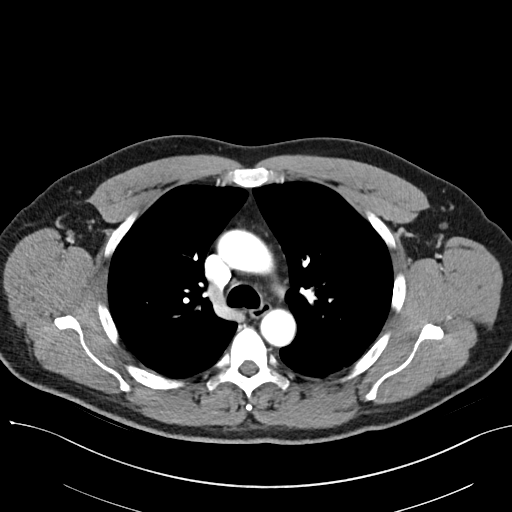
[im 133/145  soft-tissue]
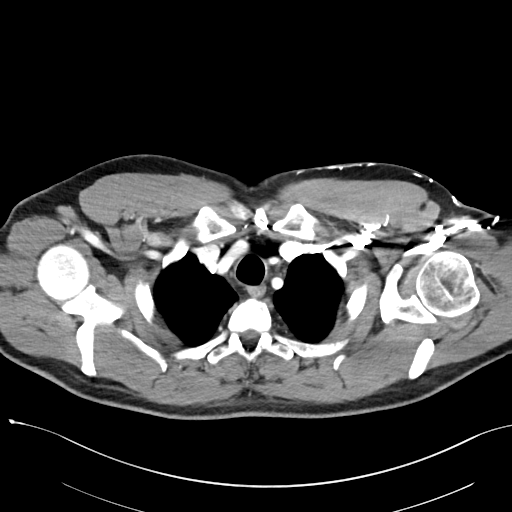

[Series 6: cap with 2.00 br40 s3 cor · coronal · 0.81mm/px · 3 of 186 slices shown]
[im 62/186  soft-tissue]
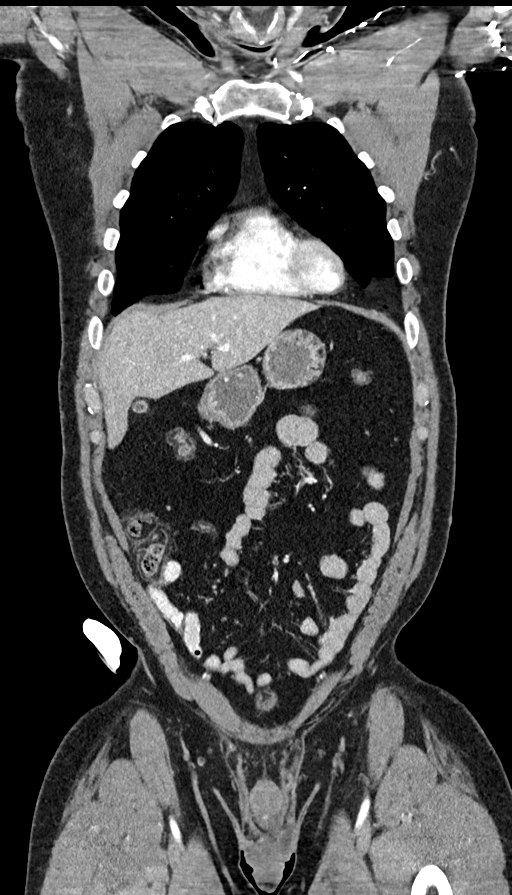
[im 83/186  soft-tissue]
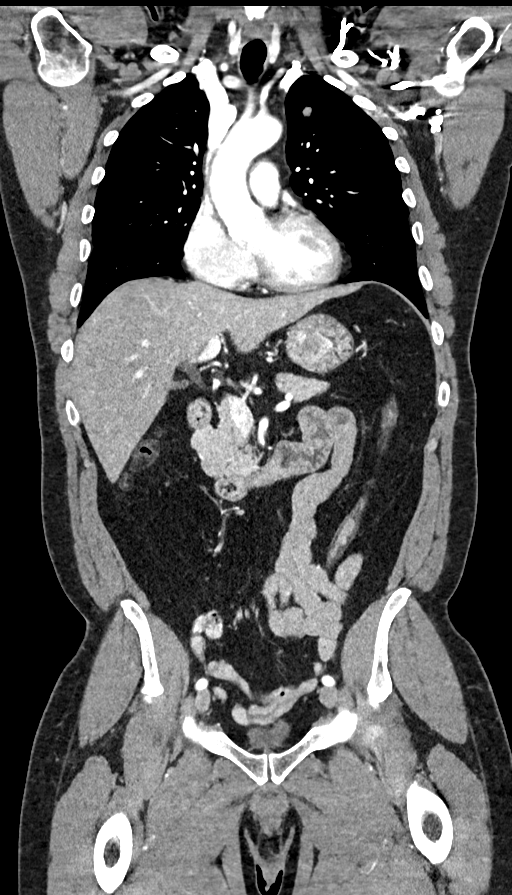
[im 103/186  soft-tissue]
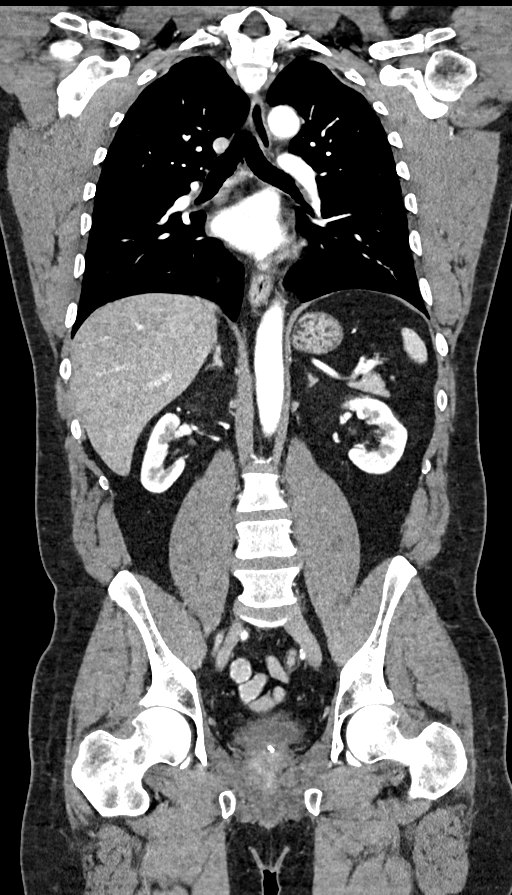

[14 of 46 positions shown; findings below may reference images not displayed]

FINDINGS: CT CHEST FINDINGS

Cardiovascular: Heart size is normal. No pericardial effusion. No
thoracic aortic aneurysm or evidence of aortic dissection.

Mediastinum/Nodes: No mass or enlarged lymph nodes seen within the
mediastinum, perihilar or axillary regions. Esophagus appears
normal. Trachea and central bronchi are unremarkable.

Lungs/Pleura: New 1.2 cm nodule within the LEFT upper lobe (series
4, image 57). Stable 3 mm nodule within the RIGHT upper lobe (image
63). Lungs are otherwise clear.

Musculoskeletal: No acute or suspicious osseous finding.

CT ABDOMEN PELVIS FINDINGS

Hepatobiliary: No focal mass or lesion is seen within the liver.
Subtle low-density area within the RIGHT liver lobe is likely
artifactual or related to geographic fatty infiltration. Gallbladder
appears normal. No bile duct dilatation.

Pancreas: Unremarkable. No pancreatic ductal dilatation or
surrounding inflammatory changes.

Spleen: Normal in size without focal abnormality.

Adrenals/Urinary Tract: Adrenal glands appear normal. Kidneys are
unremarkable without mass, stone or hydronephrosis. Bladder is
decompressed.

Stomach/Bowel: Surgical changes at the rectum, compatible with the
given history of rectal resection. Associated chronic presacral
thickening. No obstruction, inflammation or other complicating
features at the surgical anastomosis.

No dilated large or small bowel loops. RIGHT lower quadrant ostomy
in place. Appendix is normal. Stomach is unremarkable, partially
decompressed.

Vascular/Lymphatic: No vascular abnormality. No enlarged lymph nodes
seen in the abdomen or pelvis.

Reproductive: Prostate is unremarkable.

Other: No free fluid or abscess collection seen. No free
intraperitoneal air.

Musculoskeletal: No acute or suspicious osseous finding.
IMPRESSION: 1. New 1.2 cm pulmonary nodule within the LEFT upper lobe (series 4,
image 57), new compared to the chest CT of 08/21/2018, highly
suspicious for neoplastic process (favor metastatic disease).
Recommend further characterization with PET-CT and/or tissue
sampling.
2. Surgical anastomosis at the rectum. No obstruction, inflammation
or other complicating feature at the surgical anastomosis.
3. RIGHT lower quadrant ostomy in place. No bowel obstruction or
inflammation.
4. Remainder of the chest, abdomen and pelvis CT is unremarkable, as
detailed above. No bowel obstruction. No evidence of additional
metastasis within the abdomen or pelvis.

These results will be called to the ordering clinician or
representative by the Radiologist Assistant, and communication
documented in the PACS or zVision Dashboard.

## 2021-06-09 NOTE — ED Notes (Signed)
Pt called for vitals reassessment in ED lobby. No answer x2. [employer]

## 2021-06-09 NOTE — ED Provider Notes (Signed)
Emergency Medicine Provider Triage Evaluation Note  Craig [name] , a 60 y.o. male  was evaluated in triage.  Pt complains of presents with chest pain, gone for last couple weeks, but over the last few days has been consistent, increased in intensity, pain does not radiate, not associate shortness of breath, becoming diaphoretic, nausea or vomiting, he has no cardiac history, no history of PEs or DVTs, currently not on hormone therapy.  He does have active lung cancer, currently not on immunosuppressants, last episode of radiation was 2 to 3 weeks ago..  Review of Systems  Positive: Chest pain Negative: Shortness of breath fever  Physical Exam  BP (!) 144/106 (BP Location: Left Arm)   Pulse 77   Temp 98.1 F (36.7 C) (Oral)   Resp 16   Ht 5\' 9"  (1.753 m)   Wt 91.2 kg   SpO2 99%   BMI 29.68 kg/m  Gen:   Awake, no distress   Resp:  Normal effort  MSK:   Moves extremities without difficulty  Other:    Medical Decision Making  Medically screening exam initiated at 3:22 PM.  Appropriate orders placed.  Craig [name] was informed that the remainder of the evaluation will be completed by another provider, this initial triage assessment does not replace that evaluation, and the importance of remaining in the ED until their evaluation is complete.  Presents with chest pain, patient will need further work-up in the emergency department.   Craig Fennel, PA-C 06/09/21 1523    Craig Johns, MD 06/09/21 [phone]

## 2021-06-09 NOTE — ED Triage Notes (Signed)
Patient c/o left chest pain x 2 weeks, but states constant in the past 2-3 days. Patient states he has left lung cancer. Patient states that he has soreness when pressed where the pain is. Patient states he has also felt like his heart beat has been irregular.

## 2021-06-09 NOTE — ED Notes (Signed)
Pt called for vitals reassessment in ED lobby. No answer x3. [employer]

## 2021-06-09 NOTE — ED Notes (Signed)
Pt called in ED lobby for vitals, no answer x1. [employer]

## 2021-06-12 ENCOUNTER — Encounter (HOSPITAL_COMMUNITY): Payer: Self-pay | Admitting: *Deleted

## 2021-06-12 ENCOUNTER — Other Ambulatory Visit: Payer: Self-pay

## 2021-06-12 ENCOUNTER — Emergency Department (HOSPITAL_COMMUNITY)
Admission: EM | Admit: 2021-06-12 | Discharge: 2021-06-12 | Disposition: A | Discharge status: Home / Self Care | Payer: Medicare Other | Attending: Emergency Medicine | Admitting: Emergency Medicine

## 2021-06-12 ENCOUNTER — Emergency Department (HOSPITAL_COMMUNITY): Payer: Medicare Other

## 2021-06-12 DIAGNOSIS — K219 Gastro-esophageal reflux disease without esophagitis: Secondary | ICD-10-CM | POA: Insufficient documentation

## 2021-06-12 DIAGNOSIS — R0789 Other chest pain: Secondary | ICD-10-CM | POA: Diagnosis not present

## 2021-06-12 DIAGNOSIS — Z85118 Personal history of other malignant neoplasm of bronchus and lung: Secondary | ICD-10-CM | POA: Insufficient documentation

## 2021-06-12 DIAGNOSIS — X500XXA Overexertion from strenuous movement or load, initial encounter: Secondary | ICD-10-CM | POA: Diagnosis not present

## 2021-06-12 DIAGNOSIS — Z85048 Personal history of other malignant neoplasm of rectum, rectosigmoid junction, and anus: Secondary | ICD-10-CM | POA: Diagnosis not present

## 2021-06-12 DIAGNOSIS — Z85858 Personal history of malignant neoplasm of other endocrine glands: Secondary | ICD-10-CM | POA: Insufficient documentation

## 2021-06-12 DIAGNOSIS — R0781 Pleurodynia: Secondary | ICD-10-CM | POA: Insufficient documentation

## 2021-06-12 DIAGNOSIS — Z87891 Personal history of nicotine dependence: Secondary | ICD-10-CM | POA: Diagnosis not present

## 2021-06-12 LAB — BASIC METABOLIC PANEL
Anion gap: 8 (ref 5–15)
BUN: 13 mg/dL (ref 6–20)
CO2: 24 mmol/L (ref 22–32)
Calcium: 9.3 mg/dL (ref 8.9–10.3)
Chloride: 105 mmol/L (ref 98–111)
Creatinine, Ser: 1.3 mg/dL — ABNORMAL HIGH (ref 0.61–1.24)
GFR, Estimated: >60 mL/min (ref >60)
Glucose, Bld: 152 mg/dL — ABNORMAL HIGH (ref 70–99)
Potassium: 3.7 mmol/L (ref 3.5–5.1)
Sodium: 137 mmol/L (ref 135–145)

## 2021-06-12 LAB — CBC
HCT: 47.1 % (ref 39.0–52.0)
Hemoglobin: 15.6 g/dL (ref 13.0–17.0)
MCH: 30.1 pg (ref 26.0–34.0)
MCHC: 33.1 g/dL (ref 30.0–36.0)
MCV: 90.8 fL (ref 80.0–100.0)
Platelets: 228 10*3/uL (ref 150–400)
RBC: 5.19 MIL/uL (ref 4.22–5.81)
RDW: 12.8 % (ref 11.5–15.5)
WBC: 2.2 10*3/uL — ABNORMAL LOW (ref 4.0–10.5)
nRBC: 0 % (ref 0.0–0.2)

## 2021-06-12 LAB — TROPONIN I (HIGH SENSITIVITY)
Troponin I (High Sensitivity): 4 ng/L (ref <18)
Troponin I (High Sensitivity): 3 ng/L (ref <18)

## 2021-06-12 MED ORDER — IBUPROFEN 600 MG PO TABS: 600.0000 mg | ORAL_TABLET | Freq: Four times a day (QID) | ORAL | 0 refills | Status: DC | PRN

## 2021-06-12 NOTE — Discharge Instructions (Addendum)
Your exam suggest that you have body wall pain meaning rib or muscle pain.  Your work-up today is reassuring, this is not appear to be heart or lung related.  You may continue taking your chronic pain medication, I also recommend adding ibuprofen which has been prescribed along with a heating pad to your chest for 20 minutes 2-3 times daily which may be helpful as well.  Keep your appointment with your oncologist this week.  Have given you couple suggestions for finding a local primary doctor here in [place].

## 2021-06-12 NOTE — ED Triage Notes (Signed)
C/o soreness in left side of chest. Currently on radiation for lung cancer

## 2021-06-12 NOTE — ED Provider Notes (Signed)
Emergency Medicine Provider Triage Evaluation Note  Craig [name] , a 60 y.o. male  was evaluated in triage.  Pt complains of chest pain x3 days.  It comes and goes, feels tender.  Does not radiate to his back or his arm.  Was seen in the ED 06/09/2021 for the same complaint..  Review of Systems  Positive: chest pain Negative: Nausea, vomiting, shortness of breath  Physical Exam  Ht 5\' 9"  (1.753 m)   BMI 29.68 kg/m  Gen:   Awake, no distress   Resp:  Normal effort  MSK:   Moves extremities without difficulty  Other:  No M/R/G  Medical Decision Making  Medically screening exam initiated at 12:54 PM.  Appropriate orders placed.  [name] was informed that the remainder of the evaluation will be completed by another provider, this initial triage assessment does not replace that evaluation, and the importance of remaining in the ED until their evaluation is complete.     Sherrill Raring, PA-C 06/12/21 1255    Milton Ferguson, MD 06/15/21 [phone]

## 2021-06-18 ENCOUNTER — Other Ambulatory Visit: Payer: Self-pay | Admitting: Oncology

## 2021-06-18 ENCOUNTER — Other Ambulatory Visit: Payer: Self-pay

## 2021-06-18 ENCOUNTER — Inpatient Hospital Stay: Payer: Medicare Other | Admitting: Oncology

## 2021-06-18 ENCOUNTER — Inpatient Hospital Stay: Payer: Medicare Other | Attending: Nurse Practitioner

## 2021-06-18 VITALS — BP 137/95 | HR 80 | Temp 98.0°F | Resp 18 | Ht 69.0 in | Wt 199.6 lb

## 2021-06-18 DIAGNOSIS — C20 Malignant neoplasm of rectum: Secondary | ICD-10-CM

## 2021-06-18 DIAGNOSIS — Z923 Personal history of irradiation: Secondary | ICD-10-CM | POA: Diagnosis not present

## 2021-06-18 DIAGNOSIS — C7802 Secondary malignant neoplasm of left lung: Secondary | ICD-10-CM | POA: Insufficient documentation

## 2021-06-18 DIAGNOSIS — Z9221 Personal history of antineoplastic chemotherapy: Secondary | ICD-10-CM | POA: Diagnosis not present

## 2021-06-18 LAB — CBC WITH DIFFERENTIAL (CANCER CENTER ONLY)
Abs Immature Granulocytes: 0.02 10*3/uL (ref 0.00–0.07)
Basophils Absolute: 0 10*3/uL (ref 0.0–0.1)
Basophils Relative: 1 %
Eosinophils Absolute: 0.1 10*3/uL (ref 0.0–0.5)
Eosinophils Relative: 4 %
HCT: 45 % (ref 39.0–52.0)
Hemoglobin: 15.1 g/dL (ref 13.0–17.0)
Immature Granulocytes: 1 %
Lymphocytes Relative: 21 %
Lymphs Abs: 0.5 10*3/uL — ABNORMAL LOW (ref 0.7–4.0)
MCH: 30.2 pg (ref 26.0–34.0)
MCHC: 33.6 g/dL (ref 30.0–36.0)
MCV: 90 fL (ref 80.0–100.0)
Monocytes Absolute: 0.3 10*3/uL (ref 0.1–1.0)
Monocytes Relative: 15 %
Neutro Abs: 1.3 10*3/uL — ABNORMAL LOW (ref 1.7–7.7)
Neutrophils Relative %: 58 %
Platelet Count: 206 10*3/uL (ref 150–400)
RBC: 5 MIL/uL (ref 4.22–5.81)
RDW: 13 % (ref 11.5–15.5)
WBC Count: 2.2 10*3/uL — ABNORMAL LOW (ref 4.0–10.5)
nRBC: 0 % (ref 0.0–0.2)

## 2021-06-18 LAB — CMP (CANCER CENTER ONLY)
ALT: 20 U/L (ref 0–44)
AST: 17 U/L (ref 15–41)
Albumin: 3.9 g/dL (ref 3.5–5.0)
Alkaline Phosphatase: 124 U/L (ref 38–126)
Anion gap: 10 (ref 5–15)
BUN: 12 mg/dL (ref 6–20)
CO2: 24 mmol/L (ref 22–32)
Calcium: 9.6 mg/dL (ref 8.9–10.3)
Chloride: 105 mmol/L (ref 98–111)
Creatinine: 1.13 mg/dL (ref 0.61–1.24)
GFR, Estimated: >60 mL/min (ref >60)
Glucose, Bld: 96 mg/dL (ref 70–99)
Potassium: 4 mmol/L (ref 3.5–5.1)
Sodium: 139 mmol/L (ref 135–145)
Total Bilirubin: 0.4 mg/dL (ref 0.3–1.2)
Total Protein: 7.6 g/dL (ref 6.5–8.1)

## 2021-06-18 MED ORDER — HYDROCODONE-ACETAMINOPHEN 5-325 MG PO TABS: 1.0000 | ORAL_TABLET | Freq: Three times a day (TID) | ORAL | 0 refills | Status: DC | PRN

## 2021-06-18 NOTE — Progress Notes (Signed)
[facility] OFFICE PROGRESS NOTE   Diagnosis: Rectal cancer  INTERVAL HISTORY:   Mr. Brahmbhatt returns for scheduled visit.  He was seen in the emergency room on 06/09/2021 and 06/12/2021 with left anterior chest pain.  He had an episode of dyspnea when waking up several weeks ago, but no dyspnea at present.  No cough.  The fever has improved.  He reports bilateral ankle swelling, no leg swelling.  The appointment with Dr. Morton Stall has been rescheduled for 06/30/2021. Objective:  Vital signs in last 24 hours:  Blood pressure (!) 137/95, pulse 80, temperature 98 F (36.7 C), temperature source Oral, resp. rate 18, height 5\' 9"  (1.753 m), weight 199 lb 9.6 oz (90.5 kg), SpO2 96 %.    Lymphatics: No cervical, supraclavicular, axillary, or inguinal nodes Resp: Lungs clear bilaterally Cardio: Regular rate and rhythm GI: No hepatosplenomegaly, no mass, nontender Vascular: No leg edema  Skin: No rash at the left chest wall   Lab Results:  Lab Results  Component Value Date   WBC 2.2 (L) 06/18/2021   HGB 15.1 06/18/2021   HCT 45.0 06/18/2021   MCV 90.0 06/18/2021   PLT 206 06/18/2021   NEUTROABS 1.3 (L) 06/18/2021    CMP  Lab Results  Component Value Date   NA 137 06/12/2021   K 3.7 06/12/2021   CL 105 06/12/2021   CO2 24 06/12/2021   GLUCOSE 152 (H) 06/12/2021   BUN 13 06/12/2021   CREATININE 1.30 (H) 06/12/2021   CALCIUM 9.3 06/12/2021   PROT 7.5 03/11/2020   ALBUMIN 3.7 03/11/2020   AST 28 03/11/2020   ALT 41 03/11/2020   ALKPHOS 114 03/11/2020   BILITOT 0.9 03/11/2020   GFRNONAA >60 06/12/2021   GFRAA >60 06/23/2020    Lab Results  Component Value Date   CEA1 1.00 05/12/2021   CEA <1.00 05/12/2021    Lab Results  Component Value Date   INR 1.0 03/11/2020   LABPROT 12.6 03/11/2020    Imaging:  No results found.  Medications: I have reviewed the patient's current medications.   Assessment/Plan: Rectal cancer Colonoscopy 09/08/2017,  rectal mass at 5 cm from the anal verge, biopsy confirmed invasive adenocarcinoma Staging CTs 09/15/2017, rectal mass and perirectal lymphadenopathy, indeterminate right upper lobe nodule Elevated CEA MRI pelvis 10/07/2017-5.3 cm circumferential lower rectal mass with perirectal extension along the inferior mesenteric vein;  T3, N1;  distance from tumor to the anal sphincter is 5 cm. Initiation of radiation and concurrent Xeloda chemotherapy 10/12/2017; completion of radiation/Xeloda 11/23/2017. Surgery 02/01/18 per Dr. Nadeen Landau; laparoscopic lower anterior rectosigmoid resection, coloanal anastomosis, diverting loop ileostomy, flexible sigmoidoscopy; ypT3, pN0 Developed low-grade fever and abdominal pain on POD 4, CT [facility] on 02/06/18 and 02/09/18 shows small loculated fluid collections in RLQ and presacral space, consulted IR for transgluteal presacral abscess drain, treated with IV zosyn then transitioned to po cipro and flagyl Follow-up CT 03/24/2018- 2.2 x 4.9 x 4.0 cm thick-walled presacral gas and fluid collection/abscess, minimally increased status post removal of surgical drain.  No evidence of recurrent or metastatic disease. Cycle 1 adjuvant Xeloda 04/12/2018 Cycle 2 adjuvant Xeloda 05/03/2018 Cycle 3 adjuvant Xeloda 06/05/2018 (Xeloda dose reduced to 1500 mg twice daily secondary to mild neutropenia) Cycle 4 adjuvant Xeloda 06/26/2018 Cycle 5 adjuvant Xeloda 07/18/2018 CTs 08/21/2018- no evidence of tumor recurrence or metastatic disease in the chest, abdomen or pelvis.  Presacral soft tissue thickening and some calcifications compatible with postradiation changes.  Stable probably benign 3 mm  right upper lobe nodule. CTs 09/18/2019-new 1.2 cm pulmonary nodule within the left upper lobe; stable 3 mm nodule right upper lobe. PET scan 10/03/2019-hypermetabolic left upper lobe nodule, no evidence of local recurrence, no other evidence of metastatic disease Left video-assisted thoracoscopy, wedge  resection 10/22/2019-left upper lobe wedge resection with metastatic adenocarcinoma (positive for cytokeratin 20 and CDX2, negative for cytokeratin 7 and TTF-1), no carcinoma identified in one lymph node, margins uninvolved by carcinoma; 10 L lymph node biopsy with no carcinoma identified; left pleural biopsy with no carcinoma identified. Ileostomy takedown 03/20/2020 CTs 07/01/2020-nodularity left upper lobe at site of previous wedge resection, atypical appearance for simple postoperative change.  Soft tissue density about the ostomy takedown site. PET scan 01/56/1537-HKFE metabolic activity along the extraluminal locules of gas contained in the increasingly prominent presacral density.  Enlarging hypermetabolic nodules in the left upper lobe particularly along the wedge resection site.  Indistinct faintly accentuated right infrahilar and left retrohilar activity. SBRT to left lung nodules 12/16/2020-12/26/2020, 5 fractions CTs 03/30/2021-n decrease in nodularity at the the radiation/post resection margin, increased groundglass density laterally left upper lobe, no suspicious nodularity, no evidence of metastatic disease, persistent soft tissue thickening and gas collection the presacral space   2.  Pain and bleeding secondary to #1. Resolved.   3.  Skin breakdown at the gluteal fold secondary to radiation.  Resolved.   4.  History of neutropenia secondary to chemotherapy  5.  Possible rectal anastomotic leak confirmed on PET 10/14/2020 with extraluminal gas Sigmoidoscopy 11/06/2020-no evidence of recurrent tumor, anastomotic "sinus "posteriorly     Disposition: Mr. Bebout appears stable.  The etiology of the chest discomfort over the past week is unclear.  I suspect the discomfort is related to a benign musculoskeletal condition.  He feels he may have strained the chest wall while working on a [employer].  He will see Dr. Morton Stall on 06/30/2021.  He will return for an office visit and CEA in 1  month.  He will contact us in the interim for increased chest pain or new symptoms.  I refilled his prescription for hydrocodone.  Betsy Coder, MD  06/18/2021  11:00 AM

## 2021-06-19 ENCOUNTER — Other Ambulatory Visit: Payer: Medicare Other

## 2021-06-19 ENCOUNTER — Ambulatory Visit: Payer: Medicare Other | Admitting: Oncology

## 2021-06-28 NOTE — ED Provider Notes (Signed)
[facility] EMERGENCY DEPARTMENT Provider Note   CSN: [id number] Arrival date & time: 06/12/21  1144     History Chief Complaint  Patient presents with   Chest Pain    Craig [name] is a 60 y.o. male with a history significant for rectal cancer with lung metastasis, currently undergoing radiation treatment, also history of GERD, palpitations found to be PVC's and anxiety, presenting with a 3-4 day history of left sided chest "soreness", described as tender to palpation over the left anterior upper ribs also triggered by deep inspiration and stretching his arms.  He states he had lifted a heavy trailer hitch several days before the onset of his symptoms.  He denies sob, also denies n/v, diaphoresis, has no peripheral edema or leg pain, orthopnea, cough, fevers or chills.  There is also no radiation of pain.  He presented several [facility] ago at [facility] with this same complaint but left before being seen. He takes hydrocodone chronically which helps with pain.  The history is provided by the patient.      Past Medical History:  Diagnosis Date   Anxiety    Truck driver   Dysrhythmia    palpitations   Elevated serum creatinine    1.39 in 06/2012   Family history of cancer    GERD (gastroesophageal reflux disease)    History of kidney stones    Ileostomy in place [facility])    Palpitations    + chest pressure; PVCs   Rectal bleeding 01/07/2017   Rectal cancer ([place]) 09/30/2017   METASIZED TO LUNGS AND LYMPH NODES    Patient Active Problem List   Diagnosis Date Noted   S/P closure of ileostomy 03/20/2020   Nodule of left lung 10/22/2019   History of rectal cancer 10/05/2018   Malnutrition of moderate degree 02/10/2018   Family history of cancer    Rectal cancer ([place]) 09/30/2017   Rectal bleeding 01/07/2017   Rectal mass 01/07/2017   History of diagnostic tests 08/25/2012   Palpitations    Anxiety     Past Surgical History:  Procedure Laterality Date   COLONOSCOPY N/A 09/08/2017    Procedure: COLONOSCOPY;  Surgeon: Rogene Houston, MD;  [facility];  Service: Endoscopy;  Laterality: N/A;   COLONOSCOPY N/A 12/14/2018   Procedure: COLONOSCOPY;  Surgeon: Rogene Houston, MD;  Location: [facility];  Service: Endoscopy;  Laterality: N/A;  [place], URETEROSCOPY AND STENT PLACEMENT Bilateral 02/01/2018   Procedure: CYSTOSCOPY WITH RETROGRADE PYELOGRAM, URETEROSCOPY AND URETERAL CATHETERS BILATERAL;  Surgeon: Alexis Frock, MD;  Location: [facility] ORS;  Service: Urology;  Laterality: Bilateral;   FLEXIBLE SIGMOIDOSCOPY N/A 09/13/2018   Procedure: FLEXIBLE SIGMOIDOSCOPY;  Surgeon: Ileana Roup, MD;  Location: [facility];  Service: General;  Laterality: N/A;   FLEXIBLE SIGMOIDOSCOPY N/A 05/30/2019   Procedure: Beryle Quant;  Surgeon: Ileana Roup, MD;  Location: [facility];  Service: General;  Laterality: N/A;   FLEXIBLE SIGMOIDOSCOPY N/A 11/06/2020   Procedure: DIAGNOSTIC FLEXIBLE SIGMOIDOSCOPY;  Surgeon: Ileana Roup, MD;  Location: [facility] ENDOS[facility];  Service: General;  Laterality: N/A;   ILEOSTOMY CLOSURE N/A 03/20/2020   Procedure: ILEOSTOMY TAKEDOWN;  Surgeon: Ileana Roup, MD;  Location: [facility];  Service: General;  Laterality: N/A;   LAPAROSCOPIC LOW ANTERIOR RESECTION N/A 02/01/2018   Procedure: LAPAROSCOPIC  LOW ANTERIOR RECTOSIGMOID RESECTION, COLOANAL ANASTOMOSIS;  DIVERTING LOOP ILESTOMY; FLEXIBLE SIGMOIDOSCOPY;  Surgeon: Ileana Roup, MD;  Location: [facility];  Service:  General;  Laterality: N/A;   VIDEO ASSISTED THORACOSCOPY (VATS)/WEDGE RESECTION Left 10/22/2019   Procedure: VIDEO ASSISTED THORACOSCOPY (VATS) with mini thoracotomy/Left Upper Lobe Wedge RESECTION x2, left pleural biopsy, lymph node sampling, intercoastal nerve block;  Surgeon: Grace Isaac, MD;  Location: [facility];  Service: Thoracic;  Laterality: Left;   VIDEO BRONCHOSCOPY N/A 10/22/2019   Procedure: VIDEO  BRONCHOSCOPY using anesthesia scope;  Surgeon: Grace Isaac, MD;  Location: [facility] OR;  Service: Thoracic;  Laterality: N/A;       Family History  Problem Relation Age of Onset   Cancer Mother 25       Lung cancer   Leukemia Sister 58   Cancer Maternal Aunt     Social History   Tobacco Use   Smoking status: Former   Smokeless tobacco: Never   Tobacco comments:    quit at age 16  Vaping Use   Vaping Use: Never used  Substance Use Topics   Alcohol use: Not Currently    Comment: occ- none since 08/2017   Drug use: No    Home Medications Prior to Admission medications   Medication Sig Start Date End Date Taking? Authorizing Provider  ibuprofen (ADVIL) 600 MG tablet Take 1 tablet (600 mg total) by mouth every 6 (six) hours as needed. 06/12/21  Yes Maurine Mowbray, Almyra Free, PA-C  acetaminophen (TYLENOL) 500 MG tablet Take 1,000 mg by mouth every 6 (six) hours as needed for moderate pain or headache.    [provider]  aspirin-sod bicarb-citric acid (ALKA-SELTZER) 325 MG TBEF tablet Take 650 mg by mouth every 6 (six) hours as needed (heartburn).    [provider]  diphenhydrAMINE (SOMINEX) 25 MG tablet Take 50 mg by mouth at bedtime as needed for sleep.     [provider]  HYDROcodone-acetaminophen (NORCO) 5-325 MG tablet Take 1 tablet by mouth every 8 (eight) hours as needed for moderate pain. Do not drive while taking 4/0/97   Ladell Pier, MD  naphazoline-glycerin (CLEAR EYES REDNESS) 0.012-0.2 % SOLN Place 1-2 drops into both eyes 4 (four) times daily as needed for eye irritation.     [provider]  oxymetazoline (AFRIN) 0.05 % nasal spray Place 1 spray into both nostrils 2 (two) times daily as needed for congestion.    [provider]    Allergies    Patient has no known allergies.  Review of Systems   Review of Systems  Constitutional:  Negative for diaphoresis and fever.  HENT:  Negative for congestion and sore throat.   Eyes:  Negative.   Respiratory:  Negative for cough, chest tightness, shortness of breath, wheezing and stridor.   Cardiovascular:  Positive for chest pain and palpitations. Negative for leg swelling.  Gastrointestinal:  Negative for abdominal pain, nausea and vomiting.  Genitourinary: Negative.   Musculoskeletal:  Negative for arthralgias, back pain, joint swelling and neck pain.  Skin: Negative.  Negative for rash and wound.  Neurological:  Negative for dizziness, weakness, light-headedness, numbness and headaches.  Psychiatric/Behavioral: Negative.     Physical Exam Updated Vital Signs BP (!) 138/99   Pulse 100   Temp 98.2 F (36.8 C)   Resp 16   Ht 5\' 9"  (1.753 m)   SpO2 100%   BMI 29.68 kg/m   Physical Exam Vitals and nursing note reviewed.  Constitutional:      Appearance: He is well-developed.  HENT:     Head: Normocephalic and atraumatic.  Eyes:     Conjunctiva/sclera:  Conjunctivae normal.  Cardiovascular:     Rate and Rhythm: Normal rate and regular rhythm.     Heart sounds: Normal heart sounds.  Pulmonary:     Effort: Pulmonary effort is normal.     Breath sounds: Normal breath sounds. No wheezing or rales.  Chest:     Chest wall: Tenderness present. No deformity, crepitus or edema.    Abdominal:     General: Bowel sounds are normal.     Palpations: Abdomen is soft.     Tenderness: There is no abdominal tenderness.  Musculoskeletal:        General: Normal range of motion.     Cervical back: Normal range of motion.  Skin:    General: Skin is warm and dry.  Neurological:     Mental Status: He is alert.    ED Results / Procedures / Treatments   Labs (all labs ordered are listed, but only abnormal results are displayed) Labs Reviewed  BASIC METABOLIC PANEL - Abnormal; Notable for the following components:      Result Value   Glucose, Bld 152 (*)    Creatinine, Ser 1.30 (*)    All other components within normal limits  CBC - Abnormal; Notable for the  following components:   WBC 2.2 (*)    All other components within normal limits  TROPONIN I (HIGH SENSITIVITY)  TROPONIN I (HIGH SENSITIVITY)    EKG EKG Interpretation  Date/Time:  Friday June 12 2021 12:20:36 EDT Ventricular Rate:  77 PR Interval:  166 QRS Duration: 90 QT Interval:  372 QTC Calculation: 420 R Axis:   -9 Text Interpretation: Normal sinus rhythm Nonspecific T wave abnormality Abnormal ECG Confirmed by Milton Ferguson [phone]) on 06/12/2021 1:24:51 PM  Radiology No results found.  Procedures Procedures   Medications Ordered in ED Medications - No data to display  ED Course  I have reviewed the triage vital signs and the nursing notes.  Pertinent labs & imaging results that were available during my care of the patient were reviewed by me and considered in my medical decision making (see chart for details).    MDM Rules/Calculators/A&P                           Labs, ekg and imaging reviewed and stable, delta troponin normal range as was his initial trop at his [facility] visit on 7/26.  Hx and exam favors chest wall pain, probably from attempts to lift trailer onto a hitch.  He was encouraged to add ibuprofen and heat tx to site.  Avoid activity that worsens pain until it heals.  He has f/u with oncology this week. Also given referrals to establish primary care in [place].  Final Clinical Impression(s) / ED Diagnoses Final diagnoses:  Chest wall pain    Rx / DC Orders ED Discharge Orders          Ordered    ibuprofen (ADVIL) 600 MG tablet  Every 6 hours PRN        06/12/21 1747             Evalee Jefferson, PA-C 06/28/21 1642    Truddie Hidden, MD 06/30/21 1103

## 2021-06-30 DIAGNOSIS — K9189 Other postprocedural complications and disorders of digestive system: Secondary | ICD-10-CM | POA: Diagnosis not present

## 2021-07-10 ENCOUNTER — Encounter: Payer: Self-pay | Admitting: *Deleted

## 2021-07-10 NOTE — Progress Notes (Signed)
Completed DOT form re: medication completed and signed by MD. Virgel Gess with medication list to [place] att: Vallery Ridge at [phone]. Copy to HIM and will provide patient copy at next appointment.

## 2021-07-23 ENCOUNTER — Inpatient Hospital Stay: Payer: Medicare Other | Admitting: Nurse Practitioner

## 2021-07-23 ENCOUNTER — Inpatient Hospital Stay: Payer: Medicare Other

## 2021-07-27 ENCOUNTER — Telehealth: Payer: Self-pay | Admitting: Oncology

## 2021-07-27 ENCOUNTER — Other Ambulatory Visit: Payer: Self-pay | Admitting: Nurse Practitioner

## 2021-07-27 DIAGNOSIS — C20 Malignant neoplasm of rectum: Secondary | ICD-10-CM

## 2021-07-27 MED ORDER — HYDROCODONE-ACETAMINOPHEN 5-325 MG PO TABS: 1.0000 | ORAL_TABLET | Freq: Three times a day (TID) | ORAL | 0 refills | Status: DC | PRN

## 2021-07-27 NOTE — Telephone Encounter (Signed)
Returned call to patient.  He requested to rescheduled his 9/15 appt to today.  I advised him that we have no available appointments w/ Dr Benay Spice .  I advised him to call back if he needed anything else.

## 2021-07-30 ENCOUNTER — Other Ambulatory Visit: Payer: Self-pay

## 2021-07-30 ENCOUNTER — Inpatient Hospital Stay: Payer: Medicare Other | Admitting: Nurse Practitioner

## 2021-07-30 ENCOUNTER — Encounter: Payer: Self-pay | Admitting: Nurse Practitioner

## 2021-07-30 ENCOUNTER — Inpatient Hospital Stay: Payer: Medicare Other | Attending: Nurse Practitioner

## 2021-07-30 VITALS — BP 142/99 | HR 69 | Temp 98.1°F | Resp 20 | Ht 69.0 in | Wt 200.6 lb

## 2021-07-30 DIAGNOSIS — Z923 Personal history of irradiation: Secondary | ICD-10-CM | POA: Diagnosis not present

## 2021-07-30 DIAGNOSIS — Z85048 Personal history of other malignant neoplasm of rectum, rectosigmoid junction, and anus: Secondary | ICD-10-CM | POA: Diagnosis not present

## 2021-07-30 DIAGNOSIS — C20 Malignant neoplasm of rectum: Secondary | ICD-10-CM

## 2021-07-30 DIAGNOSIS — Z9221 Personal history of antineoplastic chemotherapy: Secondary | ICD-10-CM | POA: Diagnosis not present

## 2021-07-30 LAB — CBC WITH DIFFERENTIAL (CANCER CENTER ONLY)
Abs Immature Granulocytes: 0.01 10*3/uL (ref 0.00–0.07)
Basophils Absolute: 0 10*3/uL (ref 0.0–0.1)
Basophils Relative: 1 %
Eosinophils Absolute: 0.1 10*3/uL (ref 0.0–0.5)
Eosinophils Relative: 3 %
HCT: 43.8 % (ref 39.0–52.0)
Hemoglobin: 14.9 g/dL (ref 13.0–17.0)
Immature Granulocytes: 0 %
Lymphocytes Relative: 16 %
Lymphs Abs: 0.5 10*3/uL — ABNORMAL LOW (ref 0.7–4.0)
MCH: 30.2 pg (ref 26.0–34.0)
MCHC: 34 g/dL (ref 30.0–36.0)
MCV: 88.8 fL (ref 80.0–100.0)
Monocytes Absolute: 0.3 10*3/uL (ref 0.1–1.0)
Monocytes Relative: 10 %
Neutro Abs: 2.3 10*3/uL (ref 1.7–7.7)
Neutrophils Relative %: 70 %
Platelet Count: 206 10*3/uL (ref 150–400)
RBC: 4.93 MIL/uL (ref 4.22–5.81)
RDW: 12.7 % (ref 11.5–15.5)
WBC Count: 3.2 10*3/uL — ABNORMAL LOW (ref 4.0–10.5)
nRBC: 0 % (ref 0.0–0.2)

## 2021-07-30 LAB — CEA (ACCESS): CEA (CHCC): <1 ng/mL (ref 0.00–5.00)

## 2021-07-30 NOTE — Progress Notes (Signed)
[place] OFFICE PROGRESS NOTE   Diagnosis: Rectal cancer  INTERVAL HISTORY:   Craig [name] returns for follow-up.  Reports intermittent left chest pain, most recently occurred over the weekend.  He denies fever, cough, shortness of breath.  He continues to have significant rectal pain.  He takes hydrocodone as needed.  He has decided to proceed with surgery.  He has an appointment with Dr. Dema Severin next week.  Objective:  Vital signs in last 24 hours:  Blood pressure (!) 142/99, pulse 69, temperature 98.1 F (36.7 C), temperature source Tympanic, resp. rate 20, height 5\' 9"  (1.753 m), weight 200 lb 9.6 oz (91 kg), SpO2 98 %.    Lymphatics: No palpable cervical, supraclavicular, axillary or inguinal lymph nodes. Resp: Lungs clear bilaterally. Cardio: Regular rate and rhythm. GI: No hepatomegaly. Vascular: No leg edema. Musculoskeletal: Mild tenderness over the left upper chest wall.  No mass.  No rash.    Lab Results:  Lab Results  Component Value Date   WBC 2.2 (L) 06/18/2021   HGB 15.1 06/18/2021   HCT 45.0 06/18/2021   MCV 90.0 06/18/2021   PLT 206 06/18/2021   NEUTROABS 1.3 (L) 06/18/2021    Imaging:  No results found.  Medications: I have reviewed the patient's current medications.  Assessment/Plan: Rectal cancer Colonoscopy 09/08/2017, rectal mass at 5 cm from the anal verge, biopsy confirmed invasive adenocarcinoma Staging CTs 09/15/2017, rectal mass and perirectal lymphadenopathy, indeterminate right upper lobe nodule Elevated CEA MRI pelvis 10/07/2017-5.3 cm circumferential lower rectal mass with perirectal extension along the inferior mesenteric vein;  T3, N1;  distance from tumor to the anal sphincter is 5 cm. Initiation of radiation and concurrent Xeloda chemotherapy 10/12/2017; completion of radiation/Xeloda 11/23/2017. Surgery 02/01/18 per Dr. Nadeen Landau; laparoscopic lower anterior rectosigmoid resection, coloanal anastomosis, diverting  loop ileostomy, flexible sigmoidoscopy; ypT3, pN0 Developed low-grade fever and abdominal pain on POD 4, CT [facility] on 02/06/18 and 02/09/18 shows small loculated fluid collections in RLQ and presacral space, consulted IR for transgluteal presacral abscess drain, treated with IV zosyn then transitioned to po cipro and flagyl Follow-up CT 03/24/2018- 2.2 x 4.9 x 4.0 cm thick-walled presacral gas and fluid collection/abscess, minimally increased status post removal of surgical drain.  No evidence of recurrent or metastatic disease. Cycle 1 adjuvant Xeloda 04/12/2018 Cycle 2 adjuvant Xeloda 05/03/2018 Cycle 3 adjuvant Xeloda 06/05/2018 (Xeloda dose reduced to 1500 mg twice daily secondary to mild neutropenia) Cycle 4 adjuvant Xeloda 06/26/2018 Cycle 5 adjuvant Xeloda 07/18/2018 CTs 08/21/2018- no evidence of tumor recurrence or metastatic disease in the chest, abdomen or pelvis.  Presacral soft tissue thickening and some calcifications compatible with postradiation changes.  Stable probably benign 3 mm right upper lobe nodule. CTs 09/18/2019-new 1.2 cm pulmonary nodule within the left upper lobe; stable 3 mm nodule right upper lobe. PET scan 10/03/2019-hypermetabolic left upper lobe nodule, no evidence of local recurrence, no other evidence of metastatic disease Left video-assisted thoracoscopy, wedge resection 10/22/2019-left upper lobe wedge resection with metastatic adenocarcinoma (positive for cytokeratin 20 and CDX2, negative for cytokeratin 7 and TTF-1), no carcinoma identified in one lymph node, margins uninvolved by carcinoma; 10 L lymph node biopsy with no carcinoma identified; left pleural biopsy with no carcinoma identified. Ileostomy takedown 03/20/2020 CTs 07/01/2020-nodularity left upper lobe at site of previous wedge resection, atypical appearance for simple postoperative change.  Soft tissue density about the ostomy takedown site. PET scan 27/78/2423-NTIR metabolic activity along the extraluminal locules of  gas contained in the increasingly  prominent presacral density.  Enlarging hypermetabolic nodules in the left upper lobe particularly along the wedge resection site.  Indistinct faintly accentuated right infrahilar and left retrohilar activity. SBRT to left lung nodules 12/16/2020-12/26/2020, 5 fractions CTs 03/30/2021-decrease in nodularity at the the radiation/post resection margin, increased groundglass density laterally left upper lobe, no suspicious nodularity, no evidence of metastatic disease, persistent soft tissue thickening and gas collection the presacral space   2.  Pain and bleeding secondary to #1. Resolved.   3.  Skin breakdown at the gluteal fold secondary to radiation.  Resolved.   4.  History of neutropenia secondary to chemotherapy   5.  Possible rectal anastomotic leak confirmed on PET 10/14/2020 with extraluminal gas Sigmoidoscopy 11/06/2020-no evidence of recurrent tumor, anastomotic "sinus "posteriorly      Disposition: Craig [name] appears stable.  We will follow-up on the CEA from today.  Plan for restaging CT scans in approximately 2 months.    Etiology of the intermittent left chest discomfort is unclear.  Could possibly be related to previous surgery/radiation.    He has decided to proceed with a colostomy due to continued rectal pain, erratic bowel habits.  He has an upcoming appointment with Dr. Dema Severin.  He will return for lab and CT scans in approximately 2 months.  We will see him a few days later to review the results.  Patient seen with Dr. Benay Spice.    Ned Card ANP/GNP-BC   07/30/2021  1:39 PM This was a shared visit with Ned Card.  Mr. Brannigan is in clinical remission from rectal cancer.  He will return for an office visit and restaging CTs 2 months.  He plans to proceed with a diverting colostomy.  I was present for greater than 50% of today's visit.  I performed medical decision making.  Julieanne Manson, MD

## 2021-08-05 ENCOUNTER — Telehealth: Payer: Self-pay

## 2021-08-05 ENCOUNTER — Other Ambulatory Visit: Payer: Self-pay

## 2021-08-05 NOTE — Telephone Encounter (Signed)
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

## 2021-08-17 ENCOUNTER — Telehealth: Payer: Self-pay

## 2021-08-17 NOTE — Telephone Encounter (Signed)
TC from Pt stating he is out of work due to chest pain again. Informed Pt that he should go to the ER or Urgent Care to  get checked out. Informed pt per Dr. Benay Spice from a cancer stand point of view he is cleared to go back to work. Pt verbalized understanding No further problems or concerns noted.

## 2021-08-24 ENCOUNTER — Other Ambulatory Visit: Payer: Self-pay

## 2021-08-24 ENCOUNTER — Emergency Department (HOSPITAL_COMMUNITY): Payer: Medicare HMO

## 2021-08-24 ENCOUNTER — Emergency Department (HOSPITAL_COMMUNITY)
Admission: EM | Admit: 2021-08-24 | Discharge: 2021-08-24 | Disposition: A | Discharge status: Home / Self Care | Payer: Medicare HMO | Attending: Emergency Medicine | Admitting: Emergency Medicine

## 2021-08-24 ENCOUNTER — Encounter (HOSPITAL_COMMUNITY): Payer: Self-pay | Admitting: Emergency Medicine

## 2021-08-24 DIAGNOSIS — X500XXA Overexertion from strenuous movement or load, initial encounter: Secondary | ICD-10-CM | POA: Diagnosis not present

## 2021-08-24 DIAGNOSIS — R0789 Other chest pain: Secondary | ICD-10-CM | POA: Diagnosis not present

## 2021-08-24 DIAGNOSIS — Z87891 Personal history of nicotine dependence: Secondary | ICD-10-CM | POA: Insufficient documentation

## 2021-08-24 DIAGNOSIS — Z85048 Personal history of other malignant neoplasm of rectum, rectosigmoid junction, and anus: Secondary | ICD-10-CM | POA: Insufficient documentation

## 2021-08-24 DIAGNOSIS — R0781 Pleurodynia: Secondary | ICD-10-CM | POA: Insufficient documentation

## 2021-08-24 DIAGNOSIS — R079 Chest pain, unspecified: Secondary | ICD-10-CM | POA: Diagnosis not present

## 2021-08-24 DIAGNOSIS — C3490 Malignant neoplasm of unspecified part of unspecified bronchus or lung: Secondary | ICD-10-CM | POA: Diagnosis not present

## 2021-08-24 LAB — CBC WITH DIFFERENTIAL/PLATELET
Abs Immature Granulocytes: 0.01 10*3/uL (ref 0.00–0.07)
Basophils Absolute: 0 10*3/uL (ref 0.0–0.1)
Basophils Relative: 1 %
Eosinophils Absolute: 0.1 10*3/uL (ref 0.0–0.5)
Eosinophils Relative: 5 %
HCT: 46.7 % (ref 39.0–52.0)
Hemoglobin: 15.8 g/dL (ref 13.0–17.0)
Immature Granulocytes: 0 %
Lymphocytes Relative: 23 %
Lymphs Abs: 0.5 10*3/uL — ABNORMAL LOW (ref 0.7–4.0)
MCH: 31.3 pg (ref 26.0–34.0)
MCHC: 33.8 g/dL (ref 30.0–36.0)
MCV: 92.7 fL (ref 80.0–100.0)
Monocytes Absolute: 0.3 10*3/uL (ref 0.1–1.0)
Monocytes Relative: 11 %
Neutro Abs: 1.3 10*3/uL — ABNORMAL LOW (ref 1.7–7.7)
Neutrophils Relative %: 60 %
Platelets: 212 10*3/uL (ref 150–400)
RBC: 5.04 MIL/uL (ref 4.22–5.81)
RDW: 13.2 % (ref 11.5–15.5)
WBC: 2.3 10*3/uL — ABNORMAL LOW (ref 4.0–10.5)
nRBC: 0 % (ref 0.0–0.2)

## 2021-08-24 LAB — BASIC METABOLIC PANEL
Anion gap: 6 (ref 5–15)
BUN: 15 mg/dL (ref 6–20)
CO2: 26 mmol/L (ref 22–32)
Calcium: 9.2 mg/dL (ref 8.9–10.3)
Chloride: 106 mmol/L (ref 98–111)
Creatinine, Ser: 1.33 mg/dL — ABNORMAL HIGH (ref 0.61–1.24)
GFR, Estimated: >60 mL/min (ref >60)
Glucose, Bld: 92 mg/dL (ref 70–99)
Potassium: 4.5 mmol/L (ref 3.5–5.1)
Sodium: 138 mmol/L (ref 135–145)

## 2021-08-24 LAB — TROPONIN I (HIGH SENSITIVITY): Troponin I (High Sensitivity): 3 ng/L (ref <18)

## 2021-08-24 MED ORDER — IBUPROFEN 600 MG PO TABS: 600.0000 mg | ORAL_TABLET | Freq: Four times a day (QID) | ORAL | 0 refills | Status: DC | PRN

## 2021-08-24 NOTE — ED Triage Notes (Signed)
Pt arrives to ED d/t chest pain since last week, currently getting radiation tx for metastatic lung cancer.

## 2021-08-24 NOTE — Discharge Instructions (Signed)
Your labs, ekg, chest xray and exam are reassuring today.  I suspect your symptoms are due to chest wall pain as discussed (muscle, ribs, or scarring from your radiation treatments) but you would benefit from seeing a cardiologist as discussed fully confirmed cardiac health.  Call Dr. Myles Gip office to arrange an appointment time.  You may benefit from a stress test as discussed which you can arrange with him.  I do suggest using your ibuprofen as needed since this seems to help your symptoms.

## 2021-08-24 NOTE — ED Provider Notes (Signed)
Emergency Medicine Provider Triage Evaluation Note  Craig [name] , a 60 y.o. male  was evaluated in triage.  Pt complains of left-sided chest pain times months.  Review of Systems  Positive: Lung cancer Negative: Shortness of breath  Physical Exam  BP 121/89   Pulse 67   Temp 97.8 F (36.6 C) (Oral)   Resp 18   Ht 5\' 9"  (1.753 m)   Wt 90.7 kg   SpO2 100%   BMI 29.53 kg/m  Gen:   Awake, no distress     Resp:  Normal effort   MSK:   Moves extremities without difficulty   Other:     Medical Decision Making  Medically screening exam initiated at 2:46 PM.  Appropriate orders placed.  Craig [name] was informed that the remainder of the evaluation will be completed by another provider, this initial triage assessment does not replace that evaluation, and the importance of remaining in the ED until their evaluation is complete.     [name], MD 08/24/21 [phone]

## 2021-08-24 NOTE — ED Provider Notes (Signed)
[facility] EMERGENCY DEPARTMENT Provider Note   CSN: [id number] Arrival date & time: 08/24/21  1210     History Chief Complaint  Patient presents with   Chest Pain    Craig [name] is a 60 y.o. 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  Not worsened with exertion. His pain initially started after lifting a heavy trailer hitch several days before the onset of his symptoms several months ago.  He denies sob, also denies n/v, diaphoresis, has no peripheral edema or leg pain, orthopnea, cough, fevers or chills.  There is also no radiation of pain.   The history is provided by the patient.      Past Medical History:  Diagnosis Date   Anxiety    Truck driver   Dysrhythmia    palpitations   Elevated serum creatinine    1.39 in 06/2012   Family history of cancer    GERD (gastroesophageal reflux disease)    History of kidney stones    Ileostomy in place [facility])    Palpitations    + chest pressure; PVCs   Rectal bleeding 01/07/2017   Rectal cancer ([place]) 09/30/2017   METASIZED TO LUNGS AND LYMPH NODES    Patient Active Problem List   Diagnosis Date Noted   S/P closure of ileostomy 03/20/2020   Nodule of left lung 10/22/2019   History of rectal cancer 10/05/2018   Malnutrition of moderate degree 02/10/2018   Family history of cancer    Rectal cancer ([place]) 09/30/2017   Rectal bleeding 01/07/2017   Rectal mass 01/07/2017   History of diagnostic tests 08/25/2012   Palpitations    Anxiety     Past Surgical History:  Procedure Laterality Date   COLONOSCOPY N/A 09/08/2017   Procedure:  COLONOSCOPY;  Surgeon: Rogene Houston, MD;  [facility];  Service: Endoscopy;  Laterality: N/A;   COLONOSCOPY N/A 12/14/2018   Procedure: COLONOSCOPY;  Surgeon: Rogene Houston, MD;  [facility];  Service: Endoscopy;  Laterality: N/A;  [place], URETEROSCOPY AND STENT PLACEMENT Bilateral 02/01/2018   Procedure: CYSTOSCOPY WITH RETROGRADE PYELOGRAM, URETEROSCOPY AND URETERAL CATHETERS BILATERAL;  Surgeon: Alexis Frock, MD;  Location: [facility] OR[facility];  Service: Urology;  Laterality: Bilateral;   FLEXIBLE SIGMOIDOSCOPY N/A 09/13/2018   Procedure: FLEXIBLE SIGMOIDOSCOPY;  Surgeon: Ileana Roup, MD;  Location: [facility];  Service: General;  Laterality: N/A;   FLEXIBLE SIGMOIDOSCOPY N/A 05/30/2019   Procedure: Beryle Quant;  Surgeon: Ileana Roup, MD;  Location: [facility];  Service: General;  Laterality: N/A;   FLEXIBLE SIGMOIDOSCOPY N/A 11/06/2020   Procedure: DIAGNOSTIC FLEXIBLE SIGMOIDOSCOPY;  Surgeon: Ileana Roup, MD;  [facility];  Service: General;  Laterality: N/A;   ILEOSTOMY CLOSURE N/A 03/20/2020   Procedure: ILEOSTOMY TAKEDOWN;  Surgeon: Ileana Roup, MD;  [facility];  Service: General;  Laterality: N/A;   LAPAROSCOPIC LOW ANTERIOR RESECTION N/A 02/01/2018   Procedure: LAPAROSCOPIC  LOW ANTERIOR RECTOSIGMOID RESECTION, COLOANAL ANASTOMOSIS;  DIVERTING LOOP ILESTOMY; FLEXIBLE SIGMOIDOSCOPY;  Surgeon: Ileana Roup, MD;  [facility]: [facility];  Service: General;  Laterality:  N/A;   VIDEO ASSISTED THORACOSCOPY (VATS)/WEDGE RESECTION Left 10/22/2019   Procedure: VIDEO ASSISTED THORACOSCOPY (VATS) with mini thoracotomy/Left Upper Lobe Wedge RESECTION x2, left pleural biopsy, lymph node sampling, intercoastal nerve block;  Surgeon: Grace Isaac, MD;  Location: [facility];  Service: Thoracic;  Laterality: Left;   VIDEO BRONCHOSCOPY N/A 10/22/2019   Procedure: VIDEO BRONCHOSCOPY using  anesthesia scope;  Surgeon: Grace Isaac, MD;  Location: [facility] OR;  Service: Thoracic;  Laterality: N/A;       Family History  Problem Relation Age of Onset   Cancer Mother 7       Lung cancer   Leukemia Sister 51   Cancer Maternal Aunt     Social History   Tobacco Use   Smoking status: Former   Smokeless tobacco: Never   Tobacco comments:    quit at age 27  Vaping Use   Vaping Use: Never used  Substance Use Topics   Alcohol use: Not Currently    Comment: occ- none since 08/2017   Drug use: No    Home Medications Prior to Admission medications   Medication Sig Start Date End Date Taking? Authorizing Provider  acetaminophen (TYLENOL) 500 MG tablet Take 1,000 mg by mouth every 6 (six) hours as needed for moderate pain or headache.    [provider]  aspirin-sod bicarb-citric acid (ALKA-SELTZER) 325 MG TBEF tablet Take 650 mg by mouth every 6 (six) hours as needed (heartburn).    [provider]  diphenhydrAMINE (SOMINEX) 25 MG tablet Take 50 mg by mouth at bedtime as needed for sleep.     [provider]  HYDROcodone-acetaminophen (NORCO) 5-325 MG tablet Take 1 tablet by mouth every 8 (eight) hours as needed for moderate pain. Do not drive while taking 05/31/95   Owens Shark, NP  ibuprofen (ADVIL) 600 MG tablet Take 1 tablet (600 mg total) by mouth every 6 (six) hours as needed. 06/12/21   Evalee Jefferson, PA-C  naphazoline-glycerin (CLEAR EYES REDNESS) 0.012-0.2 % SOLN Place 1-2 drops into both eyes 4 (four) times daily as needed for eye irritation.     [provider]  oxymetazoline (AFRIN) 0.05 % nasal spray Place 1 spray into both nostrils 2 (two) times daily as needed for congestion.    [provider]  Oxymetazoline HCl (NASAL SPRAY) 0.05 % SOLN Place into the nose.    [provider]    Allergies    Patient has no known allergies.  Review of Systems   Review of Systems  Constitutional:  Negative for chills and  fever.  HENT:  Negative for congestion.   Eyes: Negative.   Respiratory:  Negative for chest tightness and shortness of breath.   Cardiovascular:  Positive for chest pain. Negative for palpitations and leg swelling.  Gastrointestinal:  Negative for abdominal pain, nausea and vomiting.  Genitourinary: Negative.   Musculoskeletal:  Negative for arthralgias, joint swelling and neck pain.  Skin: Negative.  Negative for rash and wound.  Neurological:  Negative for dizziness, weakness, light-headedness, numbness and headaches.  Psychiatric/Behavioral: Negative.    All other systems reviewed and are negative.  Physical Exam Updated Vital Signs BP 122/82   Pulse 67   Temp 97.8 F (36.6 C) (Oral)   Resp 19   Ht 5\' 9"  (1.753 m)   Wt 90.7 kg   SpO2 98%   BMI 29.53 kg/m   Physical Exam Vitals and nursing note reviewed.  Constitutional:      Appearance: He  is well-developed.  HENT:     Head: Normocephalic and atraumatic.  Eyes:     Conjunctiva/sclera: Conjunctivae normal.  Cardiovascular:     Rate and Rhythm: Normal rate and regular rhythm.     Heart sounds: Normal heart sounds.  Pulmonary:     Effort: Pulmonary effort is normal.     Breath sounds: Normal breath sounds. No wheezing.  Chest:     Chest wall: Tenderness present.     Comments: Mild ttp left upper chest wall with palpation.  Not triggered by deep inspiration or arm movement.  Abdominal:     General: Bowel sounds are normal.     Palpations: Abdomen is soft.     Tenderness: There is no abdominal tenderness.  Musculoskeletal:        General: Normal range of motion.     Cervical back: Normal range of motion.     Right lower leg: No tenderness. No edema.     Left lower leg: No tenderness. No edema.  Skin:    General: Skin is warm and dry.  Neurological:     Mental Status: He is alert.    ED Results / Procedures / Treatments   Labs (all labs ordered are listed, but only abnormal results are displayed) Labs  Reviewed  CBC WITH DIFFERENTIAL/PLATELET - Abnormal; Notable for the following components:      Result Value   WBC 2.3 (*)    Neutro Abs 1.3 (*)    Lymphs Abs 0.5 (*)    All other components within normal limits  BASIC METABOLIC PANEL - Abnormal; Notable for the following components:   Creatinine, Ser 1.33 (*)    All other components within normal limits  TROPONIN I (HIGH SENSITIVITY)    EKG EKG Interpretation  Date/Time:  Monday August 24 2021 12:35:13 EDT Ventricular Rate:  76 PR Interval:  168 QRS Duration: 84 QT Interval:  388 QTC Calculation: 436 R Axis:   -12 Text Interpretation: Normal sinus rhythm Nonspecific T wave abnormality Abnormal ECG No significant change since prior 7/22 Confirmed by Aletta Edouard [phone]) on 08/24/2021 12:45:46 PM  Radiology DG Chest Port 1 View  Result Date: 08/24/2021 CLINICAL DATA:  Chest pain, metastatic lung cancer. EXAM: PORTABLE CHEST 1 VIEW COMPARISON:  Chest x-ray 06/12/2021, CT chest 03/30/2021, PET CT 10/14/2020 FINDINGS: The heart and mediastinal contours are unchanged. Aortic calcification. Wedge like appearing peripheral left mid lung zone airspace opacity is similar compared to prior and likely due to overlapping osseous structures and soft tissues. Known left apical density/resection margin and radiation changes noted but less conspicuous/difficult to compare to prior CT. No focal consolidation. No pulmonary edema. No pleural effusion. No pneumothorax. No acute osseous abnormality. IMPRESSION: No acute cardiopulmonary disease. Electronically Signed   By: Iven Finn M.D.   On: 08/24/2021 15:17    Procedures Procedures   Medications Ordered in ED Medications - No data to display  ED Course  I have reviewed the triage vital signs and the nursing notes.  Pertinent labs & imaging results that were available during my care of the patient were reviewed by me and considered in my medical decision making (see chart for  details).    MDM Rules/Calculators/A&P HEAR Score: 2                         Patient with exam suggesting chronic chest wall pain, possibly secondary to his radiation therapy.  Denies shortness of breath, no pleuritic component  to his symptoms.  Doubt PE. Doubt angina.  Ibuprofen has been helpful in the past he was given a new prescription for this today.  Given his age and chronicity of his symptoms he has been referred to cardiology for further evaluation, he may benefit from a screening exercise stress test. His employer is concerned about him driving until he is medically cleared.  Referral to Dr. Domenic Polite given.  Patient understands to call in the morning for an appointment time to arrange this.  He is also just establish care with a PCP at [facility] in [place].  He is scheduled to see them in 1 week. Final Clinical Impression(s) / ED Diagnoses Final diagnoses:  Chest wall pain  Nonspecific chest pain    Rx / DC Orders ED Discharge Orders     None        Landis Martins 08/24/21 Saddie Benders, MD 09/01/21 1655

## 2021-08-26 ENCOUNTER — Ambulatory Visit: Payer: Medicare HMO | Admitting: Cardiology

## 2021-08-26 ENCOUNTER — Encounter: Payer: Self-pay | Admitting: Cardiology

## 2021-08-26 ENCOUNTER — Other Ambulatory Visit: Payer: Self-pay

## 2021-08-26 VITALS — BP 144/86 | HR 84 | Ht 69.0 in | Wt 206.0 lb

## 2021-08-26 DIAGNOSIS — R079 Chest pain, unspecified: Secondary | ICD-10-CM | POA: Diagnosis not present

## 2021-08-26 DIAGNOSIS — I10 Essential (primary) hypertension: Secondary | ICD-10-CM

## 2021-08-26 DIAGNOSIS — Z13220 Encounter for screening for lipoid disorders: Secondary | ICD-10-CM | POA: Diagnosis not present

## 2021-08-26 MED ORDER — AMLODIPINE BESYLATE 5 MG PO TABS: 5.0000 mg | ORAL_TABLET | Freq: Every day | ORAL | 3 refills | Status: DC

## 2021-08-26 MED ORDER — METOPROLOL TARTRATE 100 MG PO TABS: 100.0000 mg | ORAL_TABLET | ORAL | 0 refills | Status: DC

## 2021-08-26 NOTE — Addendum Note (Signed)
Addended by: Oswaldo Milian on: 08/26/2021 05:28 PM   Modules accepted: Orders

## 2021-08-26 NOTE — Patient Instructions (Addendum)
Medication Instructions:   START Amlodipine 5 mg daily for your blood pressure.   Take Lopressor 100 mg 2 hours before cardiac ct   *If you need a refill on your cardiac medications before your next appointment, please call your pharmacy*   Lab Work:  BMET get drawn 1 week before cardiac ct  If you have labs (blood work) drawn today and your tests are completely normal, you will receive your results only by: [place] (if you have MyChart) OR A paper copy in the mail If you have any lab test that is abnormal or we need to change your treatment, we will call you to review the results.   Testing/Procedures: Your physician has requested that you have an echocardiogram. Echocardiography is a painless test that uses sound waves to create images of your heart. It provides your doctor with information about the size and shape of your heart and how well your heart's chambers and valves are working. This procedure takes approximately one hour. There are no restrictions for this procedure.     Your cardiac CT will be scheduled at one of the below locations:   [facility] [address] [place], [place] [address] [phone]  If scheduled at [facility], please arrive at the [facility] main entrance (entrance A) of [facility] 30 minutes prior to test start time. Proceed to the [facility] Radiology Department (first floor) to check-in and test prep.    Please follow these instructions carefully (unless otherwise directed):  Hold all erectile dysfunction medications at least 3 days (72 hrs) prior to test.  On the Night Before the Test: Be sure to Drink plenty of water. Do not consume any caffeinated/decaffeinated beverages or chocolate 12 hours prior to your test. Do not take any antihistamines 12 hours prior to your test. On the Day of the Test: Drink plenty of water until 1 hour prior to the test. Do not eat any food 4 hours prior to the  test. You may take your regular medications prior to the test.  Take metoprolol (Lopressor) two hours prior to test.         After the Test: Drink plenty of water. After receiving IV contrast, you may experience a mild flushed feeling. This is normal. On occasion, you may experience a mild rash up to 24 hours after the test. This is not dangerous. If this occurs, you can take Benadryl 25 mg and increase your fluid intake. If you experience trouble breathing, this can be serious. If it is severe call 911 IMMEDIATELY. If it is mild, please call our office. If you take any of these medications: Glipizide/Metformin, Avandament, Glucavance, please do not take 48 hours after completing test unless otherwise instructed.  Please allow 2-4 weeks for scheduling of routine cardiac CTs. Some insurance companies require a pre-authorization which may delay scheduling of this test.   For non-scheduling related questions, please contact the cardiac imaging nurse navigator should you have any questions/concerns: Marchia Bond, Cardiac Imaging Nurse Navigator Gordy Clement, Cardiac Imaging Nurse Navigator [facility] Direct Office Dial: [phone]   For scheduling needs, including cancellations and rescheduling, please call [place], [phone].     Follow-Up: At [facility], you and your health needs are our priority.  As part of our continuing mission to provide you with exceptional heart care, we have created designated Provider Care Teams.  These Care Teams include your primary Cardiologist (physician) and Advanced Practice Providers (APPs -  Physician Assistants and Nurse Practitioners) who all work together to provide you with the care you need, when you need it.  We recommend signing up for the patient portal called "MyChart".  Sign up information is provided on this After Visit Summary.  MyChart is used to connect with patients for Virtual Visits (Telemedicine).   Patients are able to view lab/test results, encounter notes, upcoming appointments, etc.  Non-urgent messages can be sent to your provider as well.   To learn more about what you can do with MyChart, go to [email or web address].    Your next appointment:   3 month(s)  The format for your next appointment:   In Person  Provider:   Dr.Schumann   Other Instructions  Work note provided for you today.     Buy Omron BP machine with upper arm cuff. Do not buy wrist monitor. Take first BP 2 hours after taking medication in the morning. Take second reading anytime later in the day (always take the second reading the same time each day). Do this for 2 weeks and then call our office with readings [phone]

## 2021-08-26 NOTE — Progress Notes (Addendum)
Cardiology Office Note:    Date:  08/26/2021   ID:  Craig [name], DOB [birth year], MRN [id number]  PCP:  [facility]  Cardiologist:  Donato Heinz, MD  Electrophysiologist:  None   Referring MD: [facility]*   Chief Complaint  Patient presents with   Chest Pain    History of Present Illness:    Craig [name] is a 60 y.o. male with a hx of rectal cancer with lung metastasis status post chemoradiation, CKD who presents for an ED evaluation of chest pain.  Seen in the ED with chest pain on 08/24/21.  Troponin negative.  Felt to be chronic chest wall pain likely secondary to his radiation and was discharged.  He reports that he started having chest pain a few months ago.  Occurs on left side of his chest, describes dull aching pain.  Does report pain is worse with palpation.  However also reports pain can be worse if he is stressed.  Does not exercise.  Reports occasional dyspnea.  Occasional lightheadedness, denies any syncope.  Does have some lower extremity edema.  No palpitations.  He quit smoking at age 70.  No history of heart disease in his immediate family.   BP Readings from Last 3 Encounters:  08/26/21 (!) 144/86  08/24/21 122/82  07/30/21 (!) 142/99     Past Medical History:  Diagnosis Date   Anxiety    Truck driver   Dysrhythmia    palpitations   Elevated serum creatinine    1.39 in 06/2012   Family history of cancer    GERD (gastroesophageal reflux disease)    History of kidney stones    Ileostomy in place [facility])    Palpitations    + chest pressure; PVCs   Rectal bleeding 01/07/2017   Rectal cancer ([place]) 09/30/2017   METASIZED TO LUNGS AND LYMPH NODES    Past Surgical History:  Procedure Laterality Date   COLONOSCOPY N/A 09/08/2017   Procedure: COLONOSCOPY;  Surgeon: Rogene Houston, MD;  [facility];  Service: Endoscopy;  Laterality: N/A;   COLONOSCOPY N/A 12/14/2018   Procedure: COLONOSCOPY;  Surgeon: Rogene Houston, MD;  [facility];  Service: Endoscopy;  Laterality: N/A;  [place], URETEROSCOPY AND STENT PLACEMENT Bilateral 02/01/2018   Procedure: CYSTOSCOPY WITH RETROGRADE PYELOGRAM, URETEROSCOPY AND URETERAL CATHETERS BILATERAL;  Surgeon: Alexis Frock, MD;  Location: [facility];  Service: Urology;  Laterality: Bilateral;   FLEXIBLE SIGMOIDOSCOPY N/A 09/13/2018   Procedure: FLEXIBLE SIGMOIDOSCOPY;  Surgeon: Ileana Roup, MD;  Location: [facility];  Service: General;  Laterality: N/A;   FLEXIBLE SIGMOIDOSCOPY N/A 05/30/2019   Procedure: Beryle Quant;  Surgeon: Ileana Roup, MD;  Location: [facility];  Service: General;  Laterality: N/A;   FLEXIBLE SIGMOIDOSCOPY N/A 11/06/2020   Procedure: DIAGNOSTIC FLEXIBLE SIGMOIDOSCOPY;  Surgeon: Ileana Roup, MD;  [facility];  Service: General;  Laterality: N/A;   ILEOSTOMY CLOSURE N/A 03/20/2020   Procedure: ILEOSTOMY TAKEDOWN;  Surgeon: Ileana Roup, MD;  Location[facility];  Service: General;  Laterality: N/A;   LAPAROSCOPIC LOW ANTERIOR RESECTION N/A 02/01/2018   Procedure: LAPAROSCOPIC  LOW ANTERIOR RECTOSIGMOID RESECTION, COLOANAL ANASTOMOSIS;  DIVERTING LOOP ILESTOMY; FLEXIBLE SIGMOIDOSCOPY;  Surgeon: Ileana Roup, MD;  Location: [facility];  Service: General;  Laterality: N/A;   VIDEO ASSISTED THORACOSCOPY (VATS)/WEDGE RESECTION Left 10/22/2019   Procedure: VIDEO ASSISTED THORACOSCOPY (VATS) with mini thoracotomy/Left Upper Lobe Wedge RESECTION  x2, left pleural biopsy, lymph node sampling, intercoastal nerve block;  Surgeon: Grace Isaac, MD;  Location: [facility];  Service: Thoracic;  Laterality: Left;   VIDEO BRONCHOSCOPY N/A 10/22/2019   Procedure: VIDEO BRONCHOSCOPY using anesthesia scope;  Surgeon: Grace Isaac, MD;  Location: [facility];  Service: Thoracic;  Laterality: N/A;    Current Medications: Current Meds  Medication Sig   acetaminophen  (TYLENOL) 500 MG tablet Take 1,000 mg by mouth every 6 (six) hours as needed for moderate pain or headache.   amLODipine (NORVASC) 5 MG tablet Take 1 tablet (5 mg total) by mouth daily.   diphenhydrAMINE (SOMINEX) 25 MG tablet Take 50 mg by mouth at bedtime as needed for sleep.    HYDROcodone-acetaminophen (NORCO) 5-325 MG tablet Take 1 tablet by mouth every 8 (eight) hours as needed for moderate pain. Do not drive while taking   ibuprofen (ADVIL) 600 MG tablet Take 1 tablet (600 mg total) by mouth every 6 (six) hours as needed.   metoprolol tartrate (LOPRESSOR) 100 MG tablet Take 1 tablet (100 mg total) by mouth as directed. Take 2 Hours Prior to CT Scan   naphazoline-glycerin (CLEAR EYES REDNESS) 0.012-0.2 % SOLN Place 1-2 drops into both eyes 4 (four) times daily as needed for eye irritation.    oxymetazoline (AFRIN) 0.05 % nasal spray Place 1 spray into both nostrils 2 (two) times daily as needed for congestion.   Oxymetazoline HCl (NASAL SPRAY) 0.05 % SOLN Place into the nose.     Allergies:   Patient has no known allergies.   Social History   Socioeconomic History   Marital status: Divorced    Spouse name: Not on file   Number of children: Not on file   Years of education: Not on file   Highest education level: Not on file  Occupational History   Occupation: Truck Geophysicist/field seismologist  Tobacco Use   Smoking status: Former   Smokeless tobacco: Never   Tobacco comments:    quit at age 69  Vaping Use   Vaping Use: Never used  Substance and Sexual Activity   Alcohol use: Not Currently    Comment: occ- none since 08/2017   Drug use: No   Sexual activity: Not on file  Other Topics Concern   Not on file  Social History Narrative   Not on file   Social Determinants of Health   Financial Resource Strain: Not on file  Food Insecurity: Not on file  Transportation Needs: Not on file  Physical Activity: Not on file  Stress: Not on file  Social Connections: Not on file     Family  History: The patient's family history includes Cancer in his maternal aunt; Cancer (age of onset: 56) in his mother; Leukemia (age of onset: 63) in his sister.  ROS:   Please see the history of present illness.     All other systems reviewed and are negative.  EKGs/Labs/Other Studies Reviewed:    The following studies were reviewed today:   EKG:  EKG is not ordered today.  The ekg ordered 08/24/2021 shows normal sinus rhythm, rate 76, nonspecific T wave flattening  Recent Labs: 06/18/2021: ALT 20 08/24/2021: BUN 15; Creatinine, Ser 1.33; Hemoglobin 15.8; Platelets 212; Potassium 4.5; Sodium 138  Recent Lipid Panel No results found for: CHOL, TRIG, HDL, CHOLHDL, VLDL, LDLCALC, LDLDIRECT  Physical Exam:    VS:  BP (!) 144/86   Pulse 84   Ht 5\' 9"  (1.753 m)   Wt 206  lb (93.4 kg)   SpO2 96%   BMI 30.42 kg/m     Wt Readings from Last 3 Encounters:  08/26/21 206 lb (93.4 kg)  08/24/21 200 lb (90.7 kg)  07/30/21 200 lb 9.6 oz (91 kg)     GEN:  Well nourished, well developed in no acute distress HEENT: Normal NECK: No JVD; No carotid bruits LYMPHATICS: No lymphadenopathy CARDIAC: RRR, no murmurs, rubs, gallops RESPIRATORY:  Clear to auscultation without rales, wheezing or rhonchi  ABDOMEN: Soft, non-tender, non-distended MUSCULOSKELETAL:  No edema; No deformity  SKIN: Warm and dry NEUROLOGIC:  Alert and oriented x 3 PSYCHIATRIC:  Normal affect   ASSESSMENT:    1. Chest pain of uncertain etiology   2. Chest pain, unspecified type   3. Essential hypertension   4. Lipid screening    PLAN:    Chest pain: Atypical in description, suspect likely musculoskeletal pain as does worsen with palpation.  However pain also worsens with stress.  Given his risk factors (age, hypertension, radiation to chest), would recommend coronary CTA to rule out obstructive CAD.  Will give Lopressor 100 mg prior to study.  Check echocardiogram to rule out structural heart disease.  Hypertension:  BP elevated, not currently on any medications.  Recommend starting amlodipine 5 mg daily.  Asked patient to monitor BP twice daily at home and call with results in 2 weeks  Lipid screening: We will check lipid panel  RTC in 3 months  Medication Adjustments/Labs and Tests Ordered: Current medicines are reviewed at length with the patient today.  Concerns regarding medicines are outlined above.  Orders Placed This Encounter  Procedures   CT CORONARY MORPH W/CTA COR W/SCORE W/CA W/CM &/OR WO/CM   Basic metabolic panel   Lipid Panel With LDL/HDL Ratio   ECHOCARDIOGRAM COMPLETE   Meds ordered this encounter  Medications   metoprolol tartrate (LOPRESSOR) 100 MG tablet    Sig: Take 1 tablet (100 mg total) by mouth as directed. Take 2 Hours Prior to CT Scan    Dispense:  1 tablet    Refill:  0   amLODipine (NORVASC) 5 MG tablet    Sig: Take 1 tablet (5 mg total) by mouth daily.    Dispense:  90 tablet    Refill:  3    Patient Instructions  Medication Instructions:   START Amlodipine 5 mg daily for your blood pressure.   Take Lopressor 100 mg 2 hours before cardiac ct   *If you need a refill on your cardiac medications before your next appointment, please call your pharmacy*   Lab Work:  BMET get drawn 1 week before cardiac ct  If you have labs (blood work) drawn today and your tests are completely normal, you will receive your results only by: [place] (if you have MyChart) OR A paper copy in the mail If you have any lab test that is abnormal or we need to change your treatment, we will call you to review the results.   Testing/Procedures: Your physician has requested that you have an echocardiogram. Echocardiography is a painless test that uses sound waves to create images of your heart. It provides your doctor with information about the size and shape of your heart and how well your heart's chambers and valves are working. This procedure takes approximately one  hour. There are no restrictions for this procedure.     Your cardiac CT will be scheduled at one of the below locations:   [facility] [address]  [place], [place] [address] [phone]  If scheduled at [facility], please arrive at the [facility] main entrance (entrance A) of [facility] 30 minutes prior to test start time. Proceed to the [facility] Radiology Department (first floor) to check-in and test prep.    Please follow these instructions carefully (unless otherwise directed):  Hold all erectile dysfunction medications at least 3 days (72 hrs) prior to test.  On the Night Before the Test: Be sure to Drink plenty of water. Do not consume any caffeinated/decaffeinated beverages or chocolate 12 hours prior to your test. Do not take any antihistamines 12 hours prior to your test. On the Day of the Test: Drink plenty of water until 1 hour prior to the test. Do not eat any food 4 hours prior to the test. You may take your regular medications prior to the test.  Take metoprolol (Lopressor) two hours prior to test.         After the Test: Drink plenty of water. After receiving IV contrast, you may experience a mild flushed feeling. This is normal. On occasion, you may experience a mild rash up to 24 hours after the test. This is not dangerous. If this occurs, you can take Benadryl 25 mg and increase your fluid intake. If you experience trouble breathing, this can be serious. If it is severe call 911 IMMEDIATELY. If it is mild, please call our office. If you take any of these medications: Glipizide/Metformin, Avandament, Glucavance, please do not take 48 hours after completing test unless otherwise instructed.  Please allow 2-4 weeks for scheduling of routine cardiac CTs. Some insurance companies require a pre-authorization which may delay scheduling of this test.   For non-scheduling related questions, please contact the cardiac imaging  nurse navigator should you have any questions/concerns: Marchia Bond, Cardiac Imaging Nurse Navigator Gordy Clement, Cardiac Imaging Nurse Navigator [facility] Direct Office Dial: [phone]   For scheduling needs, including cancellations and rescheduling, please call [place], [phone].     Follow-Up: At [facility], you and your health needs are our priority.  As part of our continuing mission to provide you with exceptional heart care, we have created designated Provider Care Teams.  These Care Teams include your primary Cardiologist (physician) and Advanced Practice Providers (APPs -  Physician Assistants and Nurse Practitioners) who all work together to provide you with the care you need, when you need it.  We recommend signing up for the patient portal called "MyChart".  Sign up information is provided on this After Visit Summary.  MyChart is used to connect with patients for Virtual Visits (Telemedicine).  Patients are able to view lab/test results, encounter notes, upcoming appointments, etc.  Non-urgent messages can be sent to your provider as well.   To learn more about what you can do with MyChart, go to [email or web address].    Your next appointment:   3 month(s)  The format for your next appointment:   In Person  Provider:   Dr.Arne Schlender   Other Instructions  Work note provided for you today.     Buy Omron BP machine with upper arm cuff. Do not buy wrist monitor. Take first BP 2 hours after taking medication in the morning. Take second reading anytime later in the day (always take the second reading the same time each day). Do this for 2 weeks and then call our office with readings [phone]   Signed, Donato Heinz, MD  08/26/2021  5:28 PM    [facility]

## 2021-09-03 ENCOUNTER — Telehealth (HOSPITAL_COMMUNITY): Payer: Self-pay | Admitting: Emergency Medicine

## 2021-09-03 NOTE — Telephone Encounter (Signed)
Attempted to call patient regarding upcoming cardiac CT appointment. °Left message on voicemail with name and callback number °Amayah Staheli RN Navigator Cardiac Imaging °[facility] °[phone] Office °[phone] Cell ° °

## 2021-09-04 ENCOUNTER — Telehealth (HOSPITAL_COMMUNITY): Payer: Self-pay | Admitting: *Deleted

## 2021-09-04 NOTE — Telephone Encounter (Signed)
Reaching out to patient to offer assistance regarding upcoming cardiac imaging study; pt verbalizes understanding of appt date/time, parking situation and where to check in, pre-test NPO status and medications ordered, and verified current allergies; name and call back number provided for further questions should they arise ° °Vardaan Depascale RN Navigator Cardiac Imaging °[facility] °[phone] office °[phone] cell  ° °Patient to take 100mg metoprolol tartrate two hours prior to cardiac CT scan. °

## 2021-09-07 ENCOUNTER — Ambulatory Visit (HOSPITAL_COMMUNITY): Admission: RE | Admit: 2021-09-07 | Payer: Medicare HMO | Source: Ambulatory Visit

## 2021-09-11 ENCOUNTER — Telehealth (HOSPITAL_COMMUNITY): Payer: Self-pay | Admitting: *Deleted

## 2021-09-11 NOTE — Telephone Encounter (Signed)
Patient returning call regarding upcoming cardiac imaging study; pt verbalizes understanding of appt date/time, parking situation and where to check in, pre-test NPO status and medications ordered, and verified current allergies; name and call back number provided for further questions should they arise  Virginie Josten RN Navigator Cardiac Imaging [facility] [phone] office [phone] cell  Patient to take 100mg metoprolol tartrate two hours prior to cardiac CT scan. 

## 2021-09-15 ENCOUNTER — Ambulatory Visit (HOSPITAL_COMMUNITY): Admission: RE | Admit: 2021-09-15 | Payer: Medicare HMO | Source: Ambulatory Visit

## 2021-09-23 DIAGNOSIS — Z85048 Personal history of other malignant neoplasm of rectum, rectosigmoid junction, and anus: Secondary | ICD-10-CM | POA: Diagnosis not present

## 2021-09-23 DIAGNOSIS — Z6830 Body mass index (BMI) 30.0-30.9, adult: Secondary | ICD-10-CM | POA: Diagnosis not present

## 2021-09-23 DIAGNOSIS — K629 Disease of anus and rectum, unspecified: Secondary | ICD-10-CM | POA: Diagnosis not present

## 2021-09-23 DIAGNOSIS — Z8589 Personal history of malignant neoplasm of other organs and systems: Secondary | ICD-10-CM | POA: Diagnosis not present

## 2021-09-23 DIAGNOSIS — I10 Essential (primary) hypertension: Secondary | ICD-10-CM | POA: Diagnosis not present

## 2021-09-29 ENCOUNTER — Other Ambulatory Visit: Payer: Self-pay

## 2021-09-29 ENCOUNTER — Ambulatory Visit (HOSPITAL_BASED_OUTPATIENT_CLINIC_OR_DEPARTMENT_OTHER): Admission: RE | Admit: 2021-09-29 | Payer: Medicare HMO | Source: Ambulatory Visit

## 2021-09-29 ENCOUNTER — Inpatient Hospital Stay: Payer: Medicare HMO | Attending: Nurse Practitioner

## 2021-09-29 ENCOUNTER — Inpatient Hospital Stay: Payer: Medicare HMO

## 2021-09-29 DIAGNOSIS — Z923 Personal history of irradiation: Secondary | ICD-10-CM | POA: Diagnosis not present

## 2021-09-29 DIAGNOSIS — Z9221 Personal history of antineoplastic chemotherapy: Secondary | ICD-10-CM | POA: Insufficient documentation

## 2021-09-29 DIAGNOSIS — Z85048 Personal history of other malignant neoplasm of rectum, rectosigmoid junction, and anus: Secondary | ICD-10-CM | POA: Diagnosis not present

## 2021-09-29 DIAGNOSIS — C20 Malignant neoplasm of rectum: Secondary | ICD-10-CM

## 2021-09-29 LAB — CBC WITH DIFFERENTIAL (CANCER CENTER ONLY)
Abs Immature Granulocytes: 0 10*3/uL (ref 0.00–0.07)
Basophils Absolute: 0 10*3/uL (ref 0.0–0.1)
Basophils Relative: 0 %
Eosinophils Absolute: 0.1 10*3/uL (ref 0.0–0.5)
Eosinophils Relative: 6 %
HCT: 44.7 % (ref 39.0–52.0)
Hemoglobin: 15 g/dL (ref 13.0–17.0)
Immature Granulocytes: 0 %
Lymphocytes Relative: 25 %
Lymphs Abs: 0.6 10*3/uL — ABNORMAL LOW (ref 0.7–4.0)
MCH: 29.9 pg (ref 26.0–34.0)
MCHC: 33.6 g/dL (ref 30.0–36.0)
MCV: 89 fL (ref 80.0–100.0)
Monocytes Absolute: 0.3 10*3/uL (ref 0.1–1.0)
Monocytes Relative: 14 %
Neutro Abs: 1.3 10*3/uL — ABNORMAL LOW (ref 1.7–7.7)
Neutrophils Relative %: 55 %
Platelet Count: 219 10*3/uL (ref 150–400)
RBC: 5.02 MIL/uL (ref 4.22–5.81)
RDW: 13.1 % (ref 11.5–15.5)
WBC Count: 2.3 10*3/uL — ABNORMAL LOW (ref 4.0–10.5)
nRBC: 0 % (ref 0.0–0.2)

## 2021-09-29 LAB — CMP (CANCER CENTER ONLY)
ALT: 26 U/L (ref 0–44)
AST: 16 U/L (ref 15–41)
Albumin: 4.2 g/dL (ref 3.5–5.0)
Alkaline Phosphatase: 117 U/L (ref 38–126)
Anion gap: 8 (ref 5–15)
BUN: 15 mg/dL (ref 6–20)
CO2: 25 mmol/L (ref 22–32)
Calcium: 9.8 mg/dL (ref 8.9–10.3)
Chloride: 105 mmol/L (ref 98–111)
Creatinine: 1.2 mg/dL (ref 0.61–1.24)
GFR, Estimated: >60 mL/min (ref >60)
Glucose, Bld: 89 mg/dL (ref 70–99)
Potassium: 4.1 mmol/L (ref 3.5–5.1)
Sodium: 138 mmol/L (ref 135–145)
Total Bilirubin: 0.4 mg/dL (ref 0.3–1.2)
Total Protein: 7.8 g/dL (ref 6.5–8.1)

## 2021-09-29 LAB — CEA (ACCESS): CEA (CHCC): <1 ng/mL (ref 0.00–5.00)

## 2021-09-30 ENCOUNTER — Ambulatory Visit (HOSPITAL_BASED_OUTPATIENT_CLINIC_OR_DEPARTMENT_OTHER)
Admission: RE | Admit: 2021-09-30 | Discharge: 2021-09-30 | Disposition: A | Discharge status: Home / Self Care | Payer: Medicare HMO | Source: Ambulatory Visit | Attending: Nurse Practitioner | Admitting: Nurse Practitioner

## 2021-09-30 ENCOUNTER — Inpatient Hospital Stay: Payer: Medicare HMO | Admitting: Oncology

## 2021-09-30 ENCOUNTER — Other Ambulatory Visit: Payer: Self-pay | Admitting: Oncology

## 2021-09-30 VITALS — BP 134/95 | HR 88 | Temp 97.8°F | Resp 18 | Ht 69.0 in | Wt 206.0 lb

## 2021-09-30 DIAGNOSIS — C20 Malignant neoplasm of rectum: Secondary | ICD-10-CM

## 2021-09-30 DIAGNOSIS — R079 Chest pain, unspecified: Secondary | ICD-10-CM | POA: Diagnosis not present

## 2021-09-30 DIAGNOSIS — I700 Atherosclerosis of aorta: Secondary | ICD-10-CM | POA: Diagnosis not present

## 2021-09-30 DIAGNOSIS — Z85048 Personal history of other malignant neoplasm of rectum, rectosigmoid junction, and anus: Secondary | ICD-10-CM | POA: Diagnosis not present

## 2021-09-30 DIAGNOSIS — C19 Malignant neoplasm of rectosigmoid junction: Secondary | ICD-10-CM | POA: Diagnosis not present

## 2021-09-30 DIAGNOSIS — Z9221 Personal history of antineoplastic chemotherapy: Secondary | ICD-10-CM | POA: Diagnosis not present

## 2021-09-30 DIAGNOSIS — Z923 Personal history of irradiation: Secondary | ICD-10-CM | POA: Diagnosis not present

## 2021-09-30 MED ORDER — IOHEXOL 300 MG/ML  SOLN
85.0000 mL | Freq: Once | INTRAMUSCULAR | Status: AC | PRN
  Administered 2021-09-30: 85 mL via INTRAVENOUS

## 2021-09-30 MED ORDER — HYDROCODONE-ACETAMINOPHEN 5-325 MG PO TABS: 1.0000 | ORAL_TABLET | Freq: Three times a day (TID) | ORAL | 0 refills | Status: DC | PRN

## 2021-09-30 NOTE — Progress Notes (Signed)
Craig [name] OFFICE PROGRESS NOTE   Diagnosis: Rectal cancer  INTERVAL HISTORY:   Craig [name] returns as scheduled.  He continues to have rectal urgency.  He plans to see Dr. Dema Severin in January to discuss a diverting colostomy.  He has intermittent pain at the left anterior chest.  He describes the pain as sharp and he has a numb feeling over the left chest wall.  He takes hydrocodone infrequently.  Objective:  Vital signs in last 24 hours:  Blood pressure (!) 134/95, pulse 88, temperature 97.8 F (36.6 C), temperature source Oral, resp. rate 18, height 5\' 9"  (1.753 m), weight 206 lb (93.4 kg), SpO2 98 %.   Lymphatics: No cervical, supraclavicular, axillary, or inguinal nodes Resp: Lungs clear bilaterally Cardio: Regular rate and rhythm GI: No mass, nontender, no hepatosplenomegaly Vascular: No leg edema Musculoskeletal: No mass at the left chest wall.  The area of discomfort is at the level of the left thoracotomy scar  Lab Results:  Lab Results  Component Value Date   WBC 2.3 (L) 09/29/2021   HGB 15.0 09/29/2021   HCT 44.7 09/29/2021   MCV 89.0 09/29/2021   PLT 219 09/29/2021   NEUTROABS 1.3 (L) 09/29/2021    CMP  Lab Results  Component Value Date   NA 138 09/29/2021   K 4.1 09/29/2021   CL 105 09/29/2021   CO2 25 09/29/2021   GLUCOSE 89 09/29/2021   BUN 15 09/29/2021   CREATININE 1.20 09/29/2021   CALCIUM 9.8 09/29/2021   PROT 7.8 09/29/2021   ALBUMIN 4.2 09/29/2021   AST 16 09/29/2021   ALT 26 09/29/2021   ALKPHOS 117 09/29/2021   BILITOT 0.4 09/29/2021   GFRNONAA >60 09/29/2021   GFRAA >60 06/23/2020    Lab Results  Component Value Date   CEA1 1.00 05/12/2021   CEA <1.00 09/29/2021     Medications: I have reviewed the patient's current medications.   Assessment/Plan:  Rectal cancer Colonoscopy 09/08/2017, rectal mass at 5 cm from the anal verge, biopsy confirmed invasive adenocarcinoma Staging CTs 09/15/2017, rectal mass and  perirectal lymphadenopathy, indeterminate right upper lobe nodule Elevated CEA MRI pelvis 10/07/2017-5.3 cm circumferential lower rectal mass with perirectal extension along the inferior mesenteric vein;  T3, N1;  distance from tumor to the anal sphincter is 5 cm. Initiation of radiation and concurrent Xeloda chemotherapy 10/12/2017; completion of radiation/Xeloda 11/23/2017. Surgery 02/01/18 per Dr. Nadeen Landau; laparoscopic lower anterior rectosigmoid resection, coloanal anastomosis, diverting loop ileostomy, flexible sigmoidoscopy; ypT3, pN0 Developed low-grade fever and abdominal pain on POD 4, CT [facility] on 02/06/18 and 02/09/18 shows small loculated fluid collections in RLQ and presacral space, consulted IR for transgluteal presacral abscess drain, treated with IV zosyn then transitioned to po cipro and flagyl Follow-up CT 03/24/2018- 2.2 x 4.9 x 4.0 cm thick-walled presacral gas and fluid collection/abscess, minimally increased status post removal of surgical drain.  No evidence of recurrent or metastatic disease. Cycle 1 adjuvant Xeloda 04/12/2018 Cycle 2 adjuvant Xeloda 05/03/2018 Cycle 3 adjuvant Xeloda 06/05/2018 (Xeloda dose reduced to 1500 mg twice daily secondary to mild neutropenia) Cycle 4 adjuvant Xeloda 06/26/2018 Cycle 5 adjuvant Xeloda 07/18/2018 CTs 08/21/2018- no evidence of tumor recurrence or metastatic disease in the chest, abdomen or pelvis.  Presacral soft tissue thickening and some calcifications compatible with postradiation changes.  Stable probably benign 3 mm right upper lobe nodule. CTs 09/18/2019-new 1.2 cm pulmonary nodule within the left upper lobe; stable 3 mm nodule right upper lobe. PET scan 10/03/2019-hypermetabolic  left upper lobe nodule, no evidence of local recurrence, no other evidence of metastatic disease Left video-assisted thoracoscopy, wedge resection 10/22/2019-left upper lobe wedge resection with metastatic adenocarcinoma (positive for cytokeratin 20 and CDX2,  negative for cytokeratin 7 and TTF-1), no carcinoma identified in one lymph node, margins uninvolved by carcinoma; 10 L lymph node biopsy with no carcinoma identified; left pleural biopsy with no carcinoma identified. Ileostomy takedown 03/20/2020 CTs 07/01/2020-nodularity left upper lobe at site of previous wedge resection, atypical appearance for simple postoperative change.  Soft tissue density about the ostomy takedown site. PET scan 34/19/6222-LNLG metabolic activity along the extraluminal locules of gas contained in the increasingly prominent presacral density.  Enlarging hypermetabolic nodules in the left upper lobe particularly along the wedge resection site.  Indistinct faintly accentuated right infrahilar and left retrohilar activity. SBRT to left lung nodules 12/16/2020-12/26/2020, 5 fractions CTs 03/30/2021-decrease in nodularity at the the radiation/post resection margin, increased groundglass density laterally left upper lobe, no suspicious nodularity, no evidence of metastatic disease, persistent soft tissue thickening and gas collection the presacral space   2.  Pain and bleeding secondary to #1. Resolved.   3.  Skin breakdown at the gluteal fold secondary to radiation.  Resolved.   4.  History of neutropenia secondary to chemotherapy   5.  Possible rectal anastomotic leak confirmed on PET 10/14/2020 with extraluminal gas Sigmoidoscopy 11/06/2020-no evidence of recurrent tumor, anastomotic "sinus "posteriorly    Disposition: Craig [name] has a history of metastatic rectal cancer.  There is no clinical evidence of disease progression.  He underwent restaging CTs earlier today.  The final report is not available.  I reviewed the CT images.  There is no apparent disease progression.  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  He is planning to see Dr. Dema Severin in January to discuss a diverting colostomy for management of symptoms related to the  rectal anastomotic leak.  Craig [name] will return for an office visit and CEA in 3 months.   Betsy Coder, MD  09/30/2021  10:48 AM

## 2021-10-01 ENCOUNTER — Encounter: Payer: Self-pay | Admitting: Oncology

## 2021-10-02 ENCOUNTER — Telehealth: Payer: Self-pay

## 2021-10-02 NOTE — Telephone Encounter (Signed)
-----   Message from Craig Pier, MD sent at 10/02/2021  6:51 AM EST ----- Please call patient, CT report confirms no evidence of progressive cancer, f/u as scheduled

## 2021-10-02 NOTE — Telephone Encounter (Signed)
Pt verbalized understanding.

## 2021-10-05 ENCOUNTER — Ambulatory Visit (HOSPITAL_COMMUNITY): Admission: RE | Admit: 2021-10-05 | Payer: Medicare HMO | Source: Ambulatory Visit

## 2021-10-12 DIAGNOSIS — Z1331 Encounter for screening for depression: Secondary | ICD-10-CM | POA: Diagnosis not present

## 2021-10-12 DIAGNOSIS — Z1159 Encounter for screening for other viral diseases: Secondary | ICD-10-CM | POA: Diagnosis not present

## 2021-10-12 DIAGNOSIS — Z8589 Personal history of malignant neoplasm of other organs and systems: Secondary | ICD-10-CM | POA: Diagnosis not present

## 2021-10-12 DIAGNOSIS — Z125 Encounter for screening for malignant neoplasm of prostate: Secondary | ICD-10-CM | POA: Diagnosis not present

## 2021-10-12 DIAGNOSIS — Z85048 Personal history of other malignant neoplasm of rectum, rectosigmoid junction, and anus: Secondary | ICD-10-CM | POA: Diagnosis not present

## 2021-10-12 DIAGNOSIS — Z1389 Encounter for screening for other disorder: Secondary | ICD-10-CM | POA: Diagnosis not present

## 2021-10-12 DIAGNOSIS — Z1329 Encounter for screening for other suspected endocrine disorder: Secondary | ICD-10-CM | POA: Diagnosis not present

## 2021-10-12 DIAGNOSIS — Z114 Encounter for screening for human immunodeficiency virus [HIV]: Secondary | ICD-10-CM | POA: Diagnosis not present

## 2021-10-12 DIAGNOSIS — Z13220 Encounter for screening for lipoid disorders: Secondary | ICD-10-CM | POA: Diagnosis not present

## 2021-10-12 DIAGNOSIS — Z1321 Encounter for screening for nutritional disorder: Secondary | ICD-10-CM | POA: Diagnosis not present

## 2021-10-12 DIAGNOSIS — K629 Disease of anus and rectum, unspecified: Secondary | ICD-10-CM | POA: Diagnosis not present

## 2021-10-12 DIAGNOSIS — I10 Essential (primary) hypertension: Secondary | ICD-10-CM | POA: Diagnosis not present

## 2021-11-10 ENCOUNTER — Encounter (INDEPENDENT_AMBULATORY_CARE_PROVIDER_SITE_OTHER): Payer: Self-pay | Admitting: *Deleted

## 2021-11-26 ENCOUNTER — Ambulatory Visit: Payer: Medicare Other | Admitting: Internal Medicine

## 2021-11-29 NOTE — Progress Notes (Deleted)
Cardiology Office Note:    Date:  11/29/2021   ID:  Craig [name], DOB 01/17/1961, MRN [id number]  PCP:  [facility]  Cardiologist:  Donato Heinz, MD  Electrophysiologist:  None   Referring MD: [facility]   No chief complaint on file.   History of Present Illness:    Craig [name] is a 61 y.o. male with a hx of rectal cancer with lung metastasis status post chemoradiation, CKD who presents for follow-up.  Seen in the ED with chest pain on 08/24/21.  Troponin negative.  Felt to be chronic chest wall pain likely secondary to his radiation and was discharged.  He reports that he started having chest pain a few months ago.  Occurs on left side of his chest, describes dull aching pain.  Does report pain is worse with palpation.  However also reports pain can be worse if he is stressed.  Does not exercise.  Reports occasional dyspnea.  Occasional lightheadedness, denies any syncope.  Does have some lower extremity edema.  No palpitations.  He quit smoking at age 35.  No history of heart disease in his immediate family.  Seen for initial clinic visit on 08/26/2021.  Coronary CTA and echocardiogram ordered but have not been done.   BP Readings from Last 3 Encounters:  09/30/21 (!) 134/95  08/26/21 (!) 144/86  08/24/21 122/82     Past Medical History:  Diagnosis Date   Anxiety    Truck driver   Dysrhythmia    palpitations   Elevated serum creatinine    1.39 in 06/2012   Family history of cancer    GERD (gastroesophageal reflux disease)    History of kidney stones    Ileostomy in place [facility])    Palpitations    + chest pressure; PVCs   Rectal bleeding 01/07/2017   Rectal cancer ([place]) 09/30/2017   METASIZED TO LUNGS AND LYMPH NODES    Past Surgical History:  Procedure Laterality Date   COLONOSCOPY N/A 09/08/2017   Procedure: COLONOSCOPY;  Surgeon: Rogene Houston, MD;  [facility];  Service: Endoscopy;  Laterality: N/A;    COLONOSCOPY N/A 12/14/2018   Procedure: COLONOSCOPY;  Surgeon: Rogene Houston, MD;  Location: [facility];  Service: Endoscopy;  Laterality: N/A;  [place], URETEROSCOPY AND STENT PLACEMENT Bilateral 02/01/2018   Procedure: CYSTOSCOPY WITH RETROGRADE PYELOGRAM, URETEROSCOPY AND URETERAL CATHETERS BILATERAL;  Surgeon: Alexis Frock, MD;  Location: [facility];  Service: Urology;  Laterality: Bilateral;   FLEXIBLE SIGMOIDOSCOPY N/A 09/13/2018   Procedure: FLEXIBLE SIGMOIDOSCOPY;  Surgeon: Ileana Roup, MD;  Location: [facility];  Service: General;  Laterality: N/A;   FLEXIBLE SIGMOIDOSCOPY N/A 05/30/2019   Procedure: Beryle Quant;  Surgeon: Ileana Roup, MD;  Location: [facility];  Service: General;  Laterality: N/A;   FLEXIBLE SIGMOIDOSCOPY N/A 11/06/2020   Procedure: DIAGNOSTIC FLEXIBLE SIGMOIDOSCOPY;  Surgeon: Ileana Roup, MD;  [facility]: [facility];  Service: General;  Laterality: N/A;   ILEOSTOMY CLOSURE N/A 03/20/2020   Procedure: ILEOSTOMY TAKEDOWN;  Surgeon: Ileana Roup, MD;  Location: [facility];  Service: General;  Laterality: N/A;   LAPAROSCOPIC LOW ANTERIOR RESECTION N/A 02/01/2018   Procedure: LAPAROSCOPIC  LOW ANTERIOR RECTOSIGMOID RESECTION, COLOANAL ANASTOMOSIS;  DIVERTING LOOP ILESTOMY; FLEXIBLE SIGMOIDOSCOPY;  Surgeon: Ileana Roup, MD;  Location: [facility];  Service: General;  Laterality: N/A;   VIDEO ASSISTED THORACOSCOPY (VATS)/WEDGE RESECTION Left 10/22/2019   Procedure: VIDEO ASSISTED THORACOSCOPY (  VATS) with mini thoracotomy/Left Upper Lobe Wedge RESECTION x2, left pleural biopsy, lymph node sampling, intercoastal nerve block;  Surgeon: Grace Isaac, MD;  Location: [facility];  Service: Thoracic;  Laterality: Left;   VIDEO BRONCHOSCOPY N/A 10/22/2019   Procedure: VIDEO BRONCHOSCOPY using anesthesia scope;  Surgeon: Grace Isaac, MD;  Location: [facility];  Service: Thoracic;  Laterality: N/A;     Current Medications: No outpatient medications have been marked as taking for the 12/02/21 encounter (Appointment) with Donato Heinz, MD.     Allergies:   Patient has no known allergies.   Social History   Socioeconomic History   Marital status: Divorced    Spouse name: Not on file   Number of children: Not on file   Years of education: Not on file   Highest education level: Not on file  Occupational History   Occupation: Truck Geophysicist/field seismologist  Tobacco Use   Smoking status: Former   Smokeless tobacco: Never   Tobacco comments:    quit at age 58  Vaping Use   Vaping Use: Never used  Substance and Sexual Activity   Alcohol use: Not Currently    Comment: occ- none since 08/2017   Drug use: No   Sexual activity: Not on file  Other Topics Concern   Not on file  Social History Narrative   Not on file   Social Determinants of Health   Financial Resource Strain: Not on file  Food Insecurity: Not on file  Transportation Needs: Not on file  Physical Activity: Not on file  Stress: Not on file  Social Connections: Not on file     Family History: The patient's family history includes Cancer in his maternal aunt; Cancer (age of onset: 29) in his mother; Leukemia (age of onset: 20) in his sister.  ROS:   Please see the history of present illness.     All other systems reviewed and are negative.  EKGs/Labs/Other Studies Reviewed:    The following studies were reviewed today:   EKG:  EKG is not ordered today.  The ekg ordered 08/24/2021 shows normal sinus rhythm, rate 76, nonspecific T wave flattening  Recent Labs: 09/29/2021: ALT 26; BUN 15; Creatinine 1.20; Hemoglobin 15.0; Platelet Count 219; Potassium 4.1; Sodium 138  Recent Lipid Panel No results found for: CHOL, TRIG, HDL, CHOLHDL, VLDL, LDLCALC, LDLDIRECT  Physical Exam:    VS:  There were no vitals taken for this visit.    Wt Readings from Last 3 Encounters:  09/30/21 206 lb (93.4 kg)  08/26/21 206 lb  (93.4 kg)  08/24/21 200 lb (90.7 kg)     GEN:  Well nourished, well developed in no acute distress HEENT: Normal NECK: No JVD; No carotid bruits LYMPHATICS: No lymphadenopathy CARDIAC: RRR, no murmurs, rubs, gallops RESPIRATORY:  Clear to auscultation without rales, wheezing or rhonchi  ABDOMEN: Soft, non-tender, non-distended MUSCULOSKELETAL:  No edema; No deformity  SKIN: Warm and dry NEUROLOGIC:  Alert and oriented x 3 PSYCHIATRIC:  Normal affect   ASSESSMENT:    No diagnosis found.  PLAN:    Chest pain: Atypical in description, suspect likely musculoskeletal pain as does worsen with palpation.  However pain also worsens with stress.  Given his risk factors (age, hypertension, radiation to chest), recommend coronary CTA to rule out obstructive CAD.  Will give Lopressor 100 mg prior to study.  Check echocardiogram to rule out structural heart disease.  Hypertension: On amlodipine 5 mg  Lipid screening: Will check lipid panel  RTC in ***  Medication Adjustments/Labs and Tests Ordered: Current medicines are reviewed at length with the patient today.  Concerns regarding medicines are outlined above.  No orders of the defined types were placed in this encounter.  No orders of the defined types were placed in this encounter.   There are no Patient Instructions on file for this visit.   Signed, Donato Heinz, MD  11/29/2021 5:50 PM    [facility]

## 2021-12-02 ENCOUNTER — Ambulatory Visit: Payer: Medicare HMO | Admitting: Cardiology

## 2021-12-02 DIAGNOSIS — R198 Other specified symptoms and signs involving the digestive system and abdomen: Secondary | ICD-10-CM | POA: Diagnosis not present

## 2021-12-02 DIAGNOSIS — Z85038 Personal history of other malignant neoplasm of large intestine: Secondary | ICD-10-CM | POA: Diagnosis not present

## 2021-12-02 DIAGNOSIS — Z08 Encounter for follow-up examination after completed treatment for malignant neoplasm: Secondary | ICD-10-CM | POA: Diagnosis not present

## 2021-12-04 ENCOUNTER — Encounter (HOSPITAL_COMMUNITY): Payer: Self-pay

## 2021-12-28 ENCOUNTER — Inpatient Hospital Stay: Payer: Medicare HMO | Attending: Nurse Practitioner | Admitting: Nurse Practitioner

## 2021-12-28 ENCOUNTER — Inpatient Hospital Stay: Payer: Medicare HMO

## 2021-12-30 ENCOUNTER — Encounter: Payer: Self-pay | Admitting: *Deleted

## 2021-12-30 NOTE — Progress Notes (Signed)
Patient was "no show" for his f/u lab/OV on 2/13. Attempted to call him and number was not in service. Scheduling message sent to reschedule for mid-March.

## 2022-02-01 ENCOUNTER — Encounter: Payer: Self-pay | Admitting: Nurse Practitioner

## 2022-02-01 ENCOUNTER — Inpatient Hospital Stay: Payer: Medicare HMO | Attending: Nurse Practitioner

## 2022-02-01 ENCOUNTER — Inpatient Hospital Stay: Payer: Medicare HMO | Admitting: Nurse Practitioner

## 2022-02-01 ENCOUNTER — Other Ambulatory Visit: Payer: Self-pay

## 2022-02-01 VITALS — BP 125/91 | HR 86 | Temp 97.9°F | Resp 20 | Ht 69.0 in | Wt 205.0 lb

## 2022-02-01 DIAGNOSIS — C20 Malignant neoplasm of rectum: Secondary | ICD-10-CM

## 2022-02-01 LAB — CEA (ACCESS): CEA (CHCC): <1 ng/mL (ref 0.00–5.00)

## 2022-02-01 NOTE — Progress Notes (Signed)
?[place] ?OFFICE PROGRESS NOTE ? ? ?Diagnosis: Rectal cancer ? ?INTERVAL HISTORY:  ? ?Craig [name] returns for follow-up.  He has decided against a diverting colostomy for now.  Bowel habits continue to be erratic/irregular.  He occasionally sees blood.  He has a good appetite.  He has occasional chest pain. ? ?Objective: ? ?Vital signs in last 24 hours: ? ?Blood pressure (!) 125/91, pulse 86, temperature 97.9 ?F (36.6 ?C), temperature source Oral, resp. rate 20, height 5\' 9"  (1.753 m), weight 205 lb (93 kg), SpO2 100 %. ?  ? ?Lymphatics: No palpable cervical, supraclavicular, axillary or inguinal lymph nodes. ?Resp: Lungs clear bilaterally. ?Cardio: Regular rate and rhythm. ?GI: Abdomen soft and nontender.  No hepatosplenomegaly.  No mass. ?Vascular: No leg edema. ? ? ? ?Lab Results: ? ?Lab Results  ?Component Value Date  ? WBC 2.3 (L) 09/29/2021  ? HGB 15.0 09/29/2021  ? HCT 44.7 09/29/2021  ? MCV 89.0 09/29/2021  ? PLT 219 09/29/2021  ? NEUTROABS 1.3 (L) 09/29/2021  ? ? ?Imaging: ? ?No results found. ? ?Medications: I have reviewed the patient's current medications. ? ?Assessment/Plan: ?Rectal cancer ?Colonoscopy 09/08/2017, rectal mass at 5 cm from the anal verge, biopsy confirmed invasive adenocarcinoma ?Staging CTs 09/15/2017, rectal mass and perirectal lymphadenopathy, indeterminate right upper lobe nodule ?Elevated CEA ?MRI pelvis 10/07/2017-5.3 cm circumferential lower rectal mass with perirectal extension along the inferior mesenteric vein;  T3, N1;  distance from tumor to the anal sphincter is 5 cm. ?Initiation of radiation and concurrent Xeloda chemotherapy 10/12/2017; completion of radiation/Xeloda 11/23/2017. ?Surgery 02/01/18 per Dr. Nadeen Landau; laparoscopic lower anterior rectosigmoid resection, coloanal anastomosis, diverting loop ileostomy, flexible sigmoidoscopy; ypT3, pN0 ?Developed low-grade fever and abdominal pain on POD 4, CT AP on 02/06/18 and 02/09/18 shows small loculated  fluid collections in RLQ and presacral space, consulted IR for transgluteal presacral abscess drain, treated with IV zosyn then transitioned to po cipro and flagyl ?Follow-up CT 03/24/2018- 2.2 x 4.9 x 4.0 cm thick-walled presacral gas and fluid collection/abscess, minimally increased status post removal of surgical drain.  No evidence of recurrent or metastatic disease. ?Cycle 1 adjuvant Xeloda 04/12/2018 ?Cycle 2 adjuvant Xeloda 05/03/2018 ?Cycle 3 adjuvant Xeloda 06/05/2018 (Xeloda dose reduced to 1500 mg twice daily secondary to mild neutropenia) ?Cycle 4 adjuvant Xeloda 06/26/2018 ?Cycle 5 adjuvant Xeloda 07/18/2018 ?CTs 08/21/2018- no evidence of tumor recurrence or metastatic disease in the chest, abdomen or pelvis.  Presacral soft tissue thickening and some calcifications compatible with postradiation changes.  Stable probably benign 3 mm right upper lobe nodule. ?CTs 09/18/2019-new 1.2 cm pulmonary nodule within the left upper lobe; stable 3 mm nodule right upper lobe. ?PET scan 10/03/2019-hypermetabolic left upper lobe nodule, no evidence of local recurrence, no other evidence of metastatic disease ?Left video-assisted thoracoscopy, wedge resection 10/22/2019-left upper lobe wedge resection with metastatic adenocarcinoma (positive for cytokeratin 20 and CDX2, negative for cytokeratin 7 and TTF-1), no carcinoma identified in one lymph node, margins uninvolved by carcinoma; 10 L lymph node biopsy with no carcinoma identified; left pleural biopsy with no carcinoma identified. ?Ileostomy takedown 03/20/2020 ?CTs 07/01/2020-nodularity left upper lobe at site of previous wedge resection, atypical appearance for simple postoperative change.  Soft tissue density about the ostomy takedown site. ?PET scan 81/82/9937-JIRC metabolic activity along the extraluminal locules of gas contained in the increasingly prominent presacral density.  Enlarging hypermetabolic nodules in the left upper lobe particularly along the wedge resection  site.  Indistinct faintly accentuated right infrahilar and left retrohilar  activity. ?SBRT to left lung nodules 12/16/2020-12/26/2020, 5 fractions ?CTs 03/30/2021-decrease in nodularity at the the radiation/post resection margin, increased groundglass density laterally left upper lobe, no suspicious nodularity, no evidence of metastatic disease, persistent soft tissue thickening and gas collection the presacral space ?CTs 09/30/2021-no evidence of residual or recurrent disease within the chest, abdomen, pelvis.  Increasing soft tissue along the surgical margin left upper lobe wedge resection with progressive volume loss, likely progressive changes of radiation fibrosis.  Surgical changes of distal colectomy and colorectal anastomosis.  Stable infiltrative soft tissue within the presacral space with central cavitation. ?  ?2.  Pain and bleeding secondary to #1. Resolved. ?  ?3.  Skin breakdown at the gluteal fold secondary to radiation.  Resolved. ?  ?4.  History of neutropenia secondary to chemotherapy ?  ?5.  Possible rectal anastomotic leak confirmed on PET 10/14/2020 with extraluminal gas ?Sigmoidoscopy 11/06/2020-no evidence of recurrent tumor, anastomotic "sinus "posteriorly ?  ? ?Disposition: Craig [name] appears well.  There is no clinical evidence of disease progression.  We will follow-up on the CEA from today.  Plan for restaging CT scans in about 8 weeks which will be 6 months from previous scans. ? ?He will return for follow-up 1 week after scans are completed. ? ? ? ?Ned Card ANP/GNP-BC  ? ?02/01/2022  ?11:10 AM ? ? ? ? ? ? ? ?

## 2022-02-05 ENCOUNTER — Telehealth: Payer: Self-pay | Admitting: Oncology

## 2022-02-05 NOTE — Telephone Encounter (Signed)
Attempted to contact patient to advised about schedule change for following date 5/15. No answer so voicemail was left with details. Advised patient to call back if he has questions. ?

## 2022-03-29 ENCOUNTER — Inpatient Hospital Stay: Payer: 59 | Attending: Nurse Practitioner

## 2022-03-29 ENCOUNTER — Ambulatory Visit (HOSPITAL_BASED_OUTPATIENT_CLINIC_OR_DEPARTMENT_OTHER)
Admission: RE | Admit: 2022-03-29 | Discharge: 2022-03-29 | Disposition: A | Discharge status: Home / Self Care | Payer: Commercial Managed Care - HMO | Source: Ambulatory Visit | Attending: Nurse Practitioner | Admitting: Nurse Practitioner

## 2022-03-29 ENCOUNTER — Other Ambulatory Visit: Payer: Medicare HMO

## 2022-03-29 DIAGNOSIS — C20 Malignant neoplasm of rectum: Secondary | ICD-10-CM

## 2022-03-29 DIAGNOSIS — C801 Malignant (primary) neoplasm, unspecified: Secondary | ICD-10-CM | POA: Diagnosis not present

## 2022-03-29 DIAGNOSIS — C785 Secondary malignant neoplasm of large intestine and rectum: Secondary | ICD-10-CM | POA: Diagnosis not present

## 2022-03-29 LAB — BASIC METABOLIC PANEL - CANCER CENTER ONLY
Anion gap: 10 (ref 5–15)
BUN: 13 mg/dL (ref 6–20)
CO2: 24 mmol/L (ref 22–32)
Calcium: 9.8 mg/dL (ref 8.9–10.3)
Chloride: 104 mmol/L (ref 98–111)
Creatinine: 1.23 mg/dL (ref 0.61–1.24)
GFR, Estimated: >60 mL/min (ref >60)
Glucose, Bld: 108 mg/dL — ABNORMAL HIGH (ref 70–99)
Potassium: 3.9 mmol/L (ref 3.5–5.1)
Sodium: 138 mmol/L (ref 135–145)

## 2022-03-29 MED ORDER — IOHEXOL 300 MG/ML  SOLN
100.0000 mL | Freq: Once | INTRAMUSCULAR | Status: AC | PRN
  Administered 2022-03-29: 85 mL via INTRAVENOUS

## 2022-04-05 ENCOUNTER — Inpatient Hospital Stay: Payer: 59 | Admitting: Oncology

## 2022-04-05 VITALS — BP 126/92 | HR 81 | Temp 98.2°F | Resp 18 | Ht 69.0 in | Wt 198.8 lb

## 2022-04-05 DIAGNOSIS — C20 Malignant neoplasm of rectum: Secondary | ICD-10-CM | POA: Diagnosis not present

## 2022-04-05 DIAGNOSIS — Z85048 Personal history of other malignant neoplasm of rectum, rectosigmoid junction, and anus: Secondary | ICD-10-CM | POA: Insufficient documentation

## 2022-04-05 DIAGNOSIS — Z923 Personal history of irradiation: Secondary | ICD-10-CM | POA: Insufficient documentation

## 2022-04-05 DIAGNOSIS — Z9221 Personal history of antineoplastic chemotherapy: Secondary | ICD-10-CM | POA: Insufficient documentation

## 2022-04-05 NOTE — Progress Notes (Signed)
[place] OFFICE PROGRESS NOTE   Diagnosis: Rectal cancer  INTERVAL HISTORY:   Craig [name] returns as scheduled.  He feels well.  He is working.  Good appetite.  He is no longer taking hydrocodone for rectal pain.  He continues to have rectal discomfort.  Objective:  Vital signs in last 24 hours:  Blood pressure (!) 126/92, pulse 81, temperature 98.2 F (36.8 C), temperature source Oral, resp. rate 18, height 5\' 9"  (1.753 m), weight 198 lb 12.8 oz (90.2 kg), SpO2 99 %.    Lymphatics: No cervical, supraclavicular, or inguinal nodes.  "Shotty "bilateral axillary nodes Resp: Lungs clear bilaterally Cardio: Regular rate and rhythm GI: No hepatosplenomegaly, no mass, nontender Vascular: No leg edema  Lab Results:  Lab Results  Component Value Date   WBC 2.3 (L) 09/29/2021   HGB 15.0 09/29/2021   HCT 44.7 09/29/2021   MCV 89.0 09/29/2021   PLT 219 09/29/2021   NEUTROABS 1.3 (L) 09/29/2021    CMP  Lab Results  Component Value Date   NA 138 03/29/2022   K 3.9 03/29/2022   CL 104 03/29/2022   CO2 24 03/29/2022   GLUCOSE 108 (H) 03/29/2022   BUN 13 03/29/2022   CREATININE 1.23 03/29/2022   CALCIUM 9.8 03/29/2022   PROT 7.8 09/29/2021   ALBUMIN 4.2 09/29/2021   AST 16 09/29/2021   ALT 26 09/29/2021   ALKPHOS 117 09/29/2021   BILITOT 0.4 09/29/2021   GFRNONAA >60 03/29/2022   GFRAA >60 06/23/2020    Lab Results  Component Value Date   CEA1 1.00 05/12/2021   CEA <1.00 02/01/2022   Medications: I have reviewed the patient's current medications.   Assessment/Plan: Rectal cancer Colonoscopy 09/08/2017, rectal mass at 5 cm from the anal verge, biopsy confirmed invasive adenocarcinoma Staging CTs 09/15/2017, rectal mass and perirectal lymphadenopathy, indeterminate right upper lobe nodule Elevated CEA MRI pelvis 10/07/2017-5.3 cm circumferential lower rectal mass with perirectal extension along the inferior mesenteric vein;  T3, N1;  distance from  tumor to the anal sphincter is 5 cm. Initiation of radiation and concurrent Xeloda chemotherapy 10/12/2017; completion of radiation/Xeloda 11/23/2017. Surgery 02/01/18 per Dr. Nadeen [name]; laparoscopic lower anterior rectosigmoid resection, coloanal anastomosis, diverting loop ileostomy, flexible sigmoidoscopy; ypT3, pN0 Developed low-grade fever and abdominal pain on POD 4, CT AP on 02/06/18 and 02/09/18 shows small loculated fluid collections in RLQ and presacral space, consulted IR for transgluteal presacral abscess drain, treated with IV zosyn then transitioned to po cipro and flagyl Follow-up CT 03/24/2018- 2.2 x 4.9 x 4.0 cm thick-walled presacral gas and fluid collection/abscess, minimally increased status post removal of surgical drain.  No evidence of recurrent or metastatic disease. Cycle 1 adjuvant Xeloda 04/12/2018 Cycle 2 adjuvant Xeloda 05/03/2018 Cycle 3 adjuvant Xeloda 06/05/2018 (Xeloda dose reduced to 1500 mg twice daily secondary to mild neutropenia) Cycle 4 adjuvant Xeloda 06/26/2018 Cycle 5 adjuvant Xeloda 07/18/2018 CTs 08/21/2018- no evidence of tumor recurrence or metastatic disease in the chest, abdomen or pelvis.  Presacral soft tissue thickening and some calcifications compatible with postradiation changes.  Stable probably benign 3 mm right upper lobe nodule. CTs 09/18/2019-new 1.2 cm pulmonary nodule within the left upper lobe; stable 3 mm nodule right upper lobe. PET scan 10/03/2019-hypermetabolic left upper lobe nodule, no evidence of local recurrence, no other evidence of metastatic disease Left video-assisted thoracoscopy, wedge resection 10/22/2019-left upper lobe wedge resection with metastatic adenocarcinoma (positive for cytokeratin 20 and CDX2, negative for cytokeratin 7 and TTF-1), no carcinoma identified  in one lymph node, margins uninvolved by carcinoma; 10 L lymph node biopsy with no carcinoma identified; left pleural biopsy with no carcinoma identified. Ileostomy  takedown 03/20/2020 CTs 07/01/2020-nodularity left upper lobe at site of previous wedge resection, atypical appearance for simple postoperative change.  Soft tissue density about the ostomy takedown site. PET scan 09/38/1829-HBZJ metabolic activity along the extraluminal locules of gas contained in the increasingly prominent presacral density.  Enlarging hypermetabolic nodules in the left upper lobe particularly along the wedge resection site.  Indistinct faintly accentuated right infrahilar and left retrohilar activity. SBRT to left lung nodules 12/16/2020-12/26/2020, 5 fractions CTs 03/30/2021-decrease in nodularity at the the radiation/post resection margin, increased groundglass density laterally left upper lobe, no suspicious nodularity, no evidence of metastatic disease, persistent soft tissue thickening and gas collection the presacral space CTs 09/30/2021-no evidence of residual or recurrent disease within the chest, abdomen, pelvis.  Increasing soft tissue along the surgical margin left upper lobe wedge resection with progressive volume loss, likely progressive changes of radiation fibrosis.  Surgical changes of distal colectomy and colorectal anastomosis.  Stable infiltrative soft tissue within the presacral space with central cavitation. CTs 03/29/2022-unchanged posttreatment appearance of the presacral soft tissue with a small fistula cavity, unchanged appearance of the left pulmonary apex with treated soft tissue and radiation fibrosis no evidence of lymphadenopathy or metastatic disease   2.  Pain and bleeding secondary to #1. Resolved.   3.  Skin breakdown at the gluteal fold secondary to radiation.  Resolved.   4.  History of neutropenia secondary to chemotherapy   5.  Possible rectal anastomotic leak confirmed on PET 10/14/2020 with extraluminal gas Sigmoidoscopy 11/06/2020-no evidence of recurrent tumor, anastomotic "sinus "posteriorly      Disposition: Craig [name] remains in clinical  remission from rectal cancer.  I reviewed the CT findings and images with him.  He will return for an office visit and CEA in 3 months.  We will plan for surveillance imaging in 6 months.  He will follow-up with Dr. Dema [name] if he decides to proceed with a diverting colostomy.  Betsy Coder, MD  04/05/2022  1:03 PM

## 2022-07-06 ENCOUNTER — Encounter: Payer: Self-pay | Admitting: *Deleted

## 2022-07-06 ENCOUNTER — Inpatient Hospital Stay: Payer: Medicare HMO | Admitting: Oncology

## 2022-07-06 ENCOUNTER — Inpatient Hospital Stay: Payer: Medicare HMO | Attending: *Deleted

## 2022-07-06 NOTE — Progress Notes (Signed)
Mr. Chizek was "no show" for lab/OV today. Scheduling message sent to reschedule for 1 month.

## 2022-08-09 ENCOUNTER — Telehealth: Payer: Self-pay | Admitting: *Deleted

## 2022-08-09 ENCOUNTER — Inpatient Hospital Stay: Payer: Medicare HMO | Admitting: Oncology

## 2022-08-09 ENCOUNTER — Inpatient Hospital Stay: Payer: Medicare HMO | Attending: Oncology

## 2022-08-09 VITALS — BP 122/90 | HR 80 | Temp 98.1°F | Resp 20 | Ht 69.0 in | Wt 207.8 lb

## 2022-08-09 DIAGNOSIS — C20 Malignant neoplasm of rectum: Secondary | ICD-10-CM

## 2022-08-09 DIAGNOSIS — Z85048 Personal history of other malignant neoplasm of rectum, rectosigmoid junction, and anus: Secondary | ICD-10-CM | POA: Insufficient documentation

## 2022-08-09 LAB — CEA (ACCESS): CEA (CHCC): <1 ng/mL (ref 0.00–5.00)

## 2022-08-09 NOTE — Telephone Encounter (Signed)
-----   Message from Ladell Pier, MD sent at 08/09/2022 10:02 AM EDT ----- Please call patient, Craig [name] is normal, f/u as scheduled

## 2022-08-09 NOTE — Telephone Encounter (Signed)
Notified of normal CEA and to f/u as scheduled.

## 2022-08-09 NOTE — Progress Notes (Signed)
[facility] OFFICE PROGRESS NOTE   Diagnosis: Rectal cancer  INTERVAL HISTORY:   Mr. Denne returns as scheduled.  He continues to have irregular bowel habits and rectal burning.  He uses zinc oxide cream as needed.  No other complaint.  Objective:  Vital signs in last 24 hours:  Blood pressure (!) 122/90, pulse 80, temperature 98.1 F (36.7 C), temperature source Oral, resp. rate 20, height 5\' 9"  (1.753 m), weight 207 lb 12.8 oz (94.3 kg), SpO2 98 %.   Lymphatics: No cervical, supraclavicular, axillary, or inguinal nodes Resp: Lungs clear bilaterally Cardio: Regular rate and rhythm GI: No hepatosplenomegaly, no mass, nontender Vascular: No leg edema Skin: Left chest wall scar without evidence of recurrent tumor   Lab Results:  Lab Results  Component Value Date   WBC 2.3 (L) 09/29/2021   HGB 15.0 09/29/2021   HCT 44.7 09/29/2021   MCV 89.0 09/29/2021   PLT 219 09/29/2021   NEUTROABS 1.3 (L) 09/29/2021    CMP  Lab Results  Component Value Date   NA 138 03/29/2022   K 3.9 03/29/2022   CL 104 03/29/2022   CO2 24 03/29/2022   GLUCOSE 108 (H) 03/29/2022   BUN 13 03/29/2022   CREATININE 1.23 03/29/2022   CALCIUM 9.8 03/29/2022   PROT 7.8 09/29/2021   ALBUMIN 4.2 09/29/2021   AST 16 09/29/2021   ALT 26 09/29/2021   ALKPHOS 117 09/29/2021   BILITOT 0.4 09/29/2021   GFRNONAA >60 03/29/2022   GFRAA >60 06/23/2020    Lab Results  Component Value Date   CEA1 1.00 05/12/2021   CEA <1.00 02/01/2022     Medications: I have reviewed the patient's current medications.   Assessment/Plan: Rectal cancer Colonoscopy 09/08/2017, rectal mass at 5 cm from the anal verge, biopsy confirmed invasive adenocarcinoma Staging CTs 09/15/2017, rectal mass and perirectal lymphadenopathy, indeterminate right upper lobe nodule Elevated CEA MRI pelvis 10/07/2017-5.3 cm circumferential lower rectal mass with perirectal extension along the inferior mesenteric vein;  T3,  N1;  distance from tumor to the anal sphincter is 5 cm. Initiation of radiation and concurrent Xeloda chemotherapy 10/12/2017; completion of radiation/Xeloda 11/23/2017. Surgery 02/01/18 per Dr. Nadeen Landau; laparoscopic lower anterior rectosigmoid resection, coloanal anastomosis, diverting loop ileostomy, flexible sigmoidoscopy; ypT3, pN0 Developed low-grade fever and abdominal pain on POD 4, CT AP on 02/06/18 and 02/09/18 shows small loculated fluid collections in RLQ and presacral space, consulted IR for transgluteal presacral abscess drain, treated with IV zosyn then transitioned to po cipro and flagyl Follow-up CT 03/24/2018- 2.2 x 4.9 x 4.0 cm thick-walled presacral gas and fluid collection/abscess, minimally increased status post removal of surgical drain.  No evidence of recurrent or metastatic disease. Cycle 1 adjuvant Xeloda 04/12/2018 Cycle 2 adjuvant Xeloda 05/03/2018 Cycle 3 adjuvant Xeloda 06/05/2018 (Xeloda dose reduced to 1500 mg twice daily secondary to mild neutropenia) Cycle 4 adjuvant Xeloda 06/26/2018 Cycle 5 adjuvant Xeloda 07/18/2018 CTs 08/21/2018- no evidence of tumor recurrence or metastatic disease in the chest, abdomen or pelvis.  Presacral soft tissue thickening and some calcifications compatible with postradiation changes.  Stable probably benign 3 mm right upper lobe nodule. CTs 09/18/2019-new 1.2 cm pulmonary nodule within the left upper lobe; stable 3 mm nodule right upper lobe. PET scan 10/03/2019-hypermetabolic left upper lobe nodule, no evidence of local recurrence, no other evidence of metastatic disease Left video-assisted thoracoscopy, wedge resection 10/22/2019-left upper lobe wedge resection with metastatic adenocarcinoma (positive for cytokeratin 20 and CDX2, negative for cytokeratin 7 and TTF-1),  no carcinoma identified in one lymph node, margins uninvolved by carcinoma; 10 L lymph node biopsy with no carcinoma identified; left pleural biopsy with no carcinoma  identified. Ileostomy takedown 03/20/2020 CTs 07/01/2020-nodularity left upper lobe at site of previous wedge resection, atypical appearance for simple postoperative change.  Soft tissue density about the ostomy takedown site. PET scan 10/93/2355-DDUK metabolic activity along the extraluminal locules of gas contained in the increasingly prominent presacral density.  Enlarging hypermetabolic nodules in the left upper lobe particularly along the wedge resection site.  Indistinct faintly accentuated right infrahilar and left retrohilar activity. SBRT to left lung nodules 12/16/2020-12/26/2020, 5 fractions CTs 03/30/2021-decrease in nodularity at the the radiation/post resection margin, increased groundglass density laterally left upper lobe, no suspicious nodularity, no evidence of metastatic disease, persistent soft tissue thickening and gas collection the presacral space CTs 09/30/2021-no evidence of residual or recurrent disease within the chest, abdomen, pelvis.  Increasing soft tissue along the surgical margin left upper lobe wedge resection with progressive volume loss, likely progressive changes of radiation fibrosis.  Surgical changes of distal colectomy and colorectal anastomosis.  Stable infiltrative soft tissue within the presacral space with central cavitation. CTs 03/29/2022-unchanged posttreatment appearance of the presacral soft tissue with a small fistula cavity, unchanged appearance of the left pulmonary apex with treated soft tissue and radiation fibrosis no evidence of lymphadenopathy or metastatic disease   2.  Pain and bleeding secondary to #1. Resolved.   3.  Skin breakdown at the gluteal fold secondary to radiation.  Resolved.   4.  History of neutropenia secondary to chemotherapy   5.  Possible rectal anastomotic leak confirmed on PET 10/14/2020 with extraluminal gas Sigmoidoscopy 11/06/2020-no evidence of recurrent tumor, anastomotic "sinus "posteriorly      Disposition: Mr. Difrancesco  remains in clinical remission from rectal cancer.  We will follow-up on the CEA from today.  He will be scheduled for an office visit and surveillance imaging in 3 months.  Betsy Coder, MD  08/09/2022  9:16 AM

## 2022-09-27 ENCOUNTER — Other Ambulatory Visit: Payer: Self-pay | Admitting: Cardiology

## 2022-10-14 ENCOUNTER — Telehealth: Payer: Self-pay

## 2022-10-14 NOTE — Telephone Encounter (Signed)
TC from Mr Flemister states he started having burning in his chest since Friday (10/08/22). I spoke with Mr Ayotte and had not taking anything to help with the burning. He stated the burning is where his cancer at and he's not sure what to do at this point.  He also called his primary to see what he could do. No shortness of breath or no fever.  I ask did the burning started after eating any spices food. If the burning radiating down his arm. Patient denied any ingestion or burning radiating down his arm.

## 2022-11-12 ENCOUNTER — Ambulatory Visit (HOSPITAL_BASED_OUTPATIENT_CLINIC_OR_DEPARTMENT_OTHER)
Admission: RE | Admit: 2022-11-12 | Discharge: 2022-11-12 | Disposition: A | Discharge status: Home / Self Care | Payer: Medicare HMO | Source: Ambulatory Visit | Attending: Oncology | Admitting: Oncology

## 2022-11-12 ENCOUNTER — Inpatient Hospital Stay: Payer: Medicare HMO | Attending: Oncology

## 2022-11-12 ENCOUNTER — Inpatient Hospital Stay: Payer: Medicare HMO | Admitting: Oncology

## 2022-11-12 VITALS — BP 132/92 | HR 73 | Temp 98.1°F | Resp 18 | Ht 69.0 in | Wt 212.6 lb

## 2022-11-12 DIAGNOSIS — Z85048 Personal history of other malignant neoplasm of rectum, rectosigmoid junction, and anus: Secondary | ICD-10-CM | POA: Diagnosis not present

## 2022-11-12 DIAGNOSIS — Z923 Personal history of irradiation: Secondary | ICD-10-CM | POA: Diagnosis not present

## 2022-11-12 DIAGNOSIS — C20 Malignant neoplasm of rectum: Secondary | ICD-10-CM

## 2022-11-12 DIAGNOSIS — Z902 Acquired absence of lung [part of]: Secondary | ICD-10-CM | POA: Insufficient documentation

## 2022-11-12 DIAGNOSIS — Z9221 Personal history of antineoplastic chemotherapy: Secondary | ICD-10-CM | POA: Diagnosis not present

## 2022-11-12 DIAGNOSIS — R918 Other nonspecific abnormal finding of lung field: Secondary | ICD-10-CM | POA: Diagnosis not present

## 2022-11-12 DIAGNOSIS — R0789 Other chest pain: Secondary | ICD-10-CM | POA: Insufficient documentation

## 2022-11-12 LAB — CEA (ACCESS): CEA (CHCC): <1 ng/mL (ref 0.00–5.00)

## 2022-11-12 LAB — BASIC METABOLIC PANEL - CANCER CENTER ONLY
Anion gap: 10 (ref 5–15)
BUN: 16 mg/dL (ref 8–23)
CO2: 25 mmol/L (ref 22–32)
Calcium: 9.4 mg/dL (ref 8.9–10.3)
Chloride: 104 mmol/L (ref 98–111)
Creatinine: 1.25 mg/dL — ABNORMAL HIGH (ref 0.61–1.24)
GFR, Estimated: >60 mL/min (ref >60)
Glucose, Bld: 107 mg/dL — ABNORMAL HIGH (ref 70–99)
Potassium: 3.8 mmol/L (ref 3.5–5.1)
Sodium: 139 mmol/L (ref 135–145)

## 2022-11-12 LAB — POCT I-STAT CREATININE: Creatinine, Ser: 1.4 mg/dL — ABNORMAL HIGH (ref 0.61–1.24)

## 2022-11-12 MED ORDER — IOHEXOL 9 MG/ML PO SOLN
1000.0000 mL | Freq: Once | ORAL | Status: AC
  Administered 2022-11-12: 1000 mL via ORAL

## 2022-11-12 MED ORDER — IOHEXOL 300 MG/ML  SOLN
100.0000 mL | Freq: Once | INTRAMUSCULAR | Status: AC | PRN
  Administered 2022-11-12: 100 mL via INTRAVENOUS

## 2022-11-12 MED ORDER — METHYLPREDNISOLONE 4 MG PO TBPK: ORAL_TABLET | ORAL | 0 refills | Status: DC

## 2022-11-12 NOTE — Progress Notes (Signed)
[name] OFF[facility] PROGRESS NOTE   Diagnosis: Rectal cancer  INTERVAL HISTORY:   Craig [name] returns for a scheduled visit.  He continues to have irregular bowel habits.  He reports pain at the right anterior chest for the past month.  The pain is constant.  He rates the pain at a 5/10.  Ibuprofen helped partially.  The pain is not associated with movement or respiration.  No trauma.  Mild tenderness over the chest wall.  He is exercising.  No pain with exercise.  This includes jogging.  Objective:  Vital signs in last 24 hours:  Blood pressure (!) 132/92, pulse 73, temperature 98.1 F (36.7 C), temperature source Oral, resp. rate 18, height 5\' 9"  (1.753 m), weight 212 lb 9.6 oz (96.4 kg), SpO2 98 %.    Lymphatics: No cervical, supraclavicular, axillary, or inguinal nodes Resp: Lungs clear bilaterally in the anterior and posterior lung fields, no respiratory distress Cardio: Regular rate and rhythm GI: No hepatosplenomegaly, nontender, no mass Vascular: No leg edema Musculoskeletal: The area of discomfort is at the mid medial anterior right chest wall between the medial edge of the right breast and sternum.  No mass or rash.  No tenderness.    Lab Results:  Lab Results  Component Value Date   WBC 2.3 (L) 09/29/2021   HGB 15.0 09/29/2021   HCT 44.7 09/29/2021   MCV 89.0 09/29/2021   PLT 219 09/29/2021   NEUTROABS 1.3 (L) 09/29/2021    CMP  Lab Results  Component Value Date   NA 139 11/12/2022   K 3.8 11/12/2022   CL 104 11/12/2022   CO2 25 11/12/2022   GLUCOSE 107 (H) 11/12/2022   BUN 16 11/12/2022   CREATININE 1.40 (H) 11/12/2022   CALCIUM 9.4 11/12/2022   PROT 7.8 09/29/2021   ALBUMIN 4.2 09/29/2021   AST 16 09/29/2021   ALT 26 09/29/2021   ALKPHOS 117 09/29/2021   BILITOT 0.4 09/29/2021   GFRNONAA >60 11/12/2022   GFRAA >60 06/23/2020    Lab Results  Component Value Date   CEA1 1.00 05/12/2021   CEA <1.00 11/12/2022     Medications: I  have reviewed the patient's current medications.   Assessment/Plan: Rectal cancer Colonoscopy 09/08/2017, rectal mass at 5 cm from the anal verge, biopsy confirmed invasive adenocarcinoma Staging CTs 09/15/2017, rectal mass and perirectal lymphadenopathy, indeterminate right upper lobe nodule Elevated CEA MRI pelvis 10/07/2017-5.3 cm circumferential lower rectal mass with perirectal extension along the inferior mesenteric vein;  T3, N1;  distance from tumor to the anal sphincter is 5 cm. Initiation of radiation and concurrent Xeloda chemotherapy 10/12/2017; completion of radiation/Xeloda 11/23/2017. Surgery 02/01/18 per Dr. Nadeen Landau; laparoscopic lower anterior rectosigmoid resection, coloanal anastomosis, diverting loop ileostomy, flexible sigmoidoscopy; ypT3, pN0 Developed low-grade fever and abdominal pain on POD 4, CT AP on 02/06/18 and 02/09/18 shows small loculated fluid collections in RLQ and presacral space, consulted IR for transgluteal presacral abscess drain, treated with IV zosyn then transitioned to po cipro and flagyl Follow-up CT 03/24/2018- 2.2 x 4.9 x 4.0 cm thick-walled presacral gas and fluid collection/abscess, minimally increased status post removal of surgical drain.  No evidence of recurrent or metastatic disease. Cycle 1 adjuvant Xeloda 04/12/2018 Cycle 2 adjuvant Xeloda 05/03/2018 Cycle 3 adjuvant Xeloda 06/05/2018 (Xeloda dose reduced to 1500 mg twice daily secondary to mild neutropenia) Cycle 4 adjuvant Xeloda 06/26/2018 Cycle 5 adjuvant Xeloda 07/18/2018 CTs 08/21/2018- no evidence of tumor recurrence or metastatic disease in the chest, abdomen  or pelvis.  Presacral soft tissue thickening and some calcifications compatible with postradiation changes.  Stable probably benign 3 mm right upper lobe nodule. CTs 09/18/2019-new 1.2 cm pulmonary nodule within the left upper lobe; stable 3 mm nodule right upper lobe. PET scan 10/03/2019-hypermetabolic left upper lobe nodule, no  evidence of local recurrence, no other evidence of metastatic disease Left video-assisted thoracoscopy, wedge resection 10/22/2019-left upper lobe wedge resection with metastatic adenocarcinoma (positive for cytokeratin 20 and CDX2, negative for cytokeratin 7 and TTF-1), no carcinoma identified in one lymph node, margins uninvolved by carcinoma; 10 L lymph node biopsy with no carcinoma identified; left pleural biopsy with no carcinoma identified. Ileostomy takedown 03/20/2020 CTs 07/01/2020-nodularity left upper lobe at site of previous wedge resection, atypical appearance for simple postoperative change.  Soft tissue density about the ostomy takedown site. PET scan 11/07/8249-IBBC metabolic activity along the extraluminal locules of gas contained in the increasingly prominent presacral density.  Enlarging hypermetabolic nodules in the left upper lobe particularly along the wedge resection site.  Indistinct faintly accentuated right infrahilar and left retrohilar activity. SBRT to left lung nodules 12/16/2020-12/26/2020, 5 fractions CTs 03/30/2021-decrease in nodularity at the the radiation/post resection margin, increased groundglass density laterally left upper lobe, no suspicious nodularity, no evidence of metastatic disease, persistent soft tissue thickening and gas collection the presacral space CTs 09/30/2021-no evidence of residual or recurrent disease within the chest, abdomen, pelvis.  Increasing soft tissue along the surgical margin left upper lobe wedge resection with progressive volume loss, likely progressive changes of radiation fibrosis.  Surgical changes of distal colectomy and colorectal anastomosis.  Stable infiltrative soft tissue within the presacral space with central cavitation. CTs 03/29/2022-unchanged posttreatment appearance of the presacral soft tissue with a small fistula cavity, unchanged appearance of the left pulmonary apex with treated soft tissue and radiation fibrosis no evidence of  lymphadenopathy or metastatic disease   2.  Pain and bleeding secondary to #1. Resolved.   3.  Skin breakdown at the gluteal fold secondary to radiation.  Resolved.   4.  History of neutropenia secondary to chemotherapy   5.  Possible rectal anastomotic leak confirmed on PET 10/14/2020 with extraluminal gas Sigmoidoscopy 11/06/2020-no evidence of recurrent tumor, anastomotic "sinus "posteriorly 6.  Right anterior chest wall pain December 2023 Medrol Dosepak 11/12/2022        Disposition: Craig [name] has a history of rectal cancer.  The CEA remains normal.  He had a restaging chest CT today.  I reviewed the CT images with Craig [name].  I do not see evidence of disease progression in the chest.  No apparent abnormality at the right chest wall or ribs.  We will wait on the final CT report.  He may have costochondritis.  He will begin a Medrol Dosepak.  Craig [name] will return for an office visit in 3 weeks.  He will continue Tylenol as needed for pain.  Betsy Coder, MD  11/12/2022  12:26 PM

## 2022-11-18 ENCOUNTER — Telehealth: Payer: Self-pay | Admitting: *Deleted

## 2022-11-18 NOTE — Telephone Encounter (Signed)
Informed Mr. Schrack of CT results and to f/u as scheduled. MD will repeat non-contrast CT chest in ~ 3 months.

## 2022-11-18 NOTE — Telephone Encounter (Signed)
-----   Message from Ladell Pier, MD sent at 11/17/2022  7:29 PM EST ----- Please call patient, CT report reveals no evidence of recurrent cancer, slight increased thickening at the left chest surgical site is likely scar tissue, we will repeat a noncontrast chest CT in 3 months, follow-up as scheduled

## 2022-11-24 ENCOUNTER — Telehealth: Payer: Self-pay | Admitting: *Deleted

## 2022-11-24 NOTE — Telephone Encounter (Signed)
Mr. Coomes had left VM requesting a return call. Has questions about his cancer. Attempted to return call and had to leave VM to call again tomorrow. He has f/u lab/OV on 12/02/22.

## 2022-12-02 ENCOUNTER — Inpatient Hospital Stay: Payer: Medicare HMO | Attending: Oncology

## 2022-12-02 ENCOUNTER — Inpatient Hospital Stay: Payer: Medicare HMO | Admitting: Nurse Practitioner

## 2022-12-02 ENCOUNTER — Encounter: Payer: Self-pay | Admitting: Nurse Practitioner

## 2022-12-02 VITALS — BP 125/88 | HR 80 | Temp 98.4°F | Resp 18 | Wt 206.8 lb

## 2022-12-02 DIAGNOSIS — Z923 Personal history of irradiation: Secondary | ICD-10-CM | POA: Insufficient documentation

## 2022-12-02 DIAGNOSIS — C20 Malignant neoplasm of rectum: Secondary | ICD-10-CM | POA: Diagnosis not present

## 2022-12-02 DIAGNOSIS — Z9221 Personal history of antineoplastic chemotherapy: Secondary | ICD-10-CM | POA: Diagnosis not present

## 2022-12-02 DIAGNOSIS — Z85048 Personal history of other malignant neoplasm of rectum, rectosigmoid junction, and anus: Secondary | ICD-10-CM | POA: Insufficient documentation

## 2022-12-02 DIAGNOSIS — R0789 Other chest pain: Secondary | ICD-10-CM | POA: Insufficient documentation

## 2022-12-02 LAB — BASIC METABOLIC PANEL - CANCER CENTER ONLY
Anion gap: 6 (ref 5–15)
BUN: 15 mg/dL (ref 8–23)
CO2: 24 mmol/L (ref 22–32)
Calcium: 9.5 mg/dL (ref 8.9–10.3)
Chloride: 108 mmol/L (ref 98–111)
Creatinine: 1.29 mg/dL — ABNORMAL HIGH (ref 0.61–1.24)
GFR, Estimated: >60 mL/min (ref >60)
Glucose, Bld: 107 mg/dL — ABNORMAL HIGH (ref 70–99)
Potassium: 4.3 mmol/L (ref 3.5–5.1)
Sodium: 138 mmol/L (ref 135–145)

## 2022-12-02 NOTE — Progress Notes (Signed)
[facility] OFFICE PROGRESS NOTE   Diagnosis:  Rectal cancer  INTERVAL HISTORY:   Craig [name] returns as scheduled.  He continues to have the right anterior chest discomfort.  Slight improvement since completing the course of steroids.  Otherwise no complaints.  He has a good appetite.  Continued irregular bowel habits.  Objective:  Vital signs in last 24 hours:  Blood pressure 125/88, pulse 80, temperature 98.4 F (36.9 C), temperature source Oral, resp. rate 18, weight 206 lb 12.8 oz (93.8 kg), SpO2 95 %.    Lymphatics: No palpable cervical, supraclavicular, axillary or inguinal lymph nodes. Resp: Lungs clear bilaterally. Cardio: Regular rate and rhythm. GI: Abdomen soft and nontender.  No hepatosplenomegaly. Vascular: No leg edema. Musculoskeletal: Nontender over the right anterior chest wall.  No mass. Skin: No rash.   Lab Results:  Lab Results  Component Value Date   WBC 2.3 (L) 09/29/2021   HGB 15.0 09/29/2021   HCT 44.7 09/29/2021   MCV 89.0 09/29/2021   PLT 219 09/29/2021   NEUTROABS 1.3 (L) 09/29/2021    Imaging:  No results found.  Medications: I have reviewed the patient's current medications.  Assessment/Plan: Rectal cancer Colonoscopy 09/08/2017, rectal mass at 5 cm from the anal verge, biopsy confirmed invasive adenocarcinoma Staging CTs 09/15/2017, rectal mass and perirectal lymphadenopathy, indeterminate right upper lobe nodule Elevated CEA MRI pelvis 10/07/2017-5.3 cm circumferential lower rectal mass with perirectal extension along the inferior mesenteric vein;  T3, N1;  distance from tumor to the anal sphincter is 5 cm. Initiation of radiation and concurrent Xeloda chemotherapy 10/12/2017; completion of radiation/Xeloda 11/23/2017. Surgery 02/01/18 per Dr. Marin Olp; laparoscopic lower anterior rectosigmoid resection, coloanal anastomosis, diverting loop ileostomy, flexible sigmoidoscopy; ypT3, pN0 Developed low-grade fever and  abdominal pain on POD 4, CT AP on 02/06/18 and 02/09/18 shows small loculated fluid collections in RLQ and presacral space, consulted IR for transgluteal presacral abscess drain, treated with IV zosyn then transitioned to po cipro and flagyl Follow-up CT 03/24/2018- 2.2 x 4.9 x 4.0 cm thick-walled presacral gas and fluid collection/abscess, minimally increased status post removal of surgical drain.  No evidence of recurrent or metastatic disease. Cycle 1 adjuvant Xeloda 04/12/2018 Cycle 2 adjuvant Xeloda 05/03/2018 Cycle 3 adjuvant Xeloda 06/05/2018 (Xeloda dose reduced to 1500 mg twice daily secondary to mild neutropenia) Cycle 4 adjuvant Xeloda 06/26/2018 Cycle 5 adjuvant Xeloda 07/18/2018 CTs 08/21/2018- no evidence of tumor recurrence or metastatic disease in the chest, abdomen or pelvis.  Presacral soft tissue thickening and some calcifications compatible with postradiation changes.  Stable probably benign 3 mm right upper lobe nodule. CTs 09/18/2019-new 1.2 cm pulmonary nodule within the left upper lobe; stable 3 mm nodule right upper lobe. PET scan 10/03/2019-hypermetabolic left upper lobe nodule, no evidence of local recurrence, no other evidence of metastatic disease Left video-assisted thoracoscopy, wedge resection 10/22/2019-left upper lobe wedge resection with metastatic adenocarcinoma (positive for cytokeratin 20 and CDX2, negative for cytokeratin 7 and TTF-1), no carcinoma identified in one lymph node, margins uninvolved by carcinoma; 10 L lymph node biopsy with no carcinoma identified; left pleural biopsy with no carcinoma identified. Ileostomy takedown 03/20/2020 CTs 07/01/2020-nodularity left upper lobe at site of previous wedge resection, atypical appearance for simple postoperative change.  Soft tissue density about the ostomy takedown site. PET scan 10/14/2020-high metabolic activity along the extraluminal locules of gas contained in the increasingly prominent presacral density.  Enlarging  hypermetabolic nodules in the left upper lobe particularly along the wedge resection site.  Indistinct  faintly accentuated right infrahilar and left retrohilar activity. SBRT to left lung nodules 12/16/2020-12/26/2020, 5 fractions CTs 03/30/2021-decrease in nodularity at the the radiation/post resection margin, increased groundglass density laterally left upper lobe, no suspicious nodularity, no evidence of metastatic disease, persistent soft tissue thickening and gas collection the presacral space CTs 09/30/2021-no evidence of residual or recurrent disease within the chest, abdomen, pelvis.  Increasing soft tissue along the surgical margin left upper lobe wedge resection with progressive volume loss, likely progressive changes of radiation fibrosis.  Surgical changes of distal colectomy and colorectal anastomosis.  Stable infiltrative soft tissue within the presacral space with central cavitation. CTs 03/29/2022-unchanged posttreatment appearance of the presacral soft tissue with a small fistula cavity, unchanged appearance of the left pulmonary apex with treated soft tissue and radiation fibrosis no evidence of lymphadenopathy or metastatic disease CTs 11/12/2022-status post left upper lobe wedge resection.  Increased soft tissue density surrounding surgical sutures.  Similar appearance of presacral gas and fluid collection.   2.  Pain and bleeding secondary to #1. Resolved.   3.  Skin breakdown at the gluteal fold secondary to radiation.  Resolved.   4.  History of neutropenia secondary to chemotherapy   5.  Possible rectal anastomotic leak confirmed on PET 10/14/2020 with extraluminal gas Sigmoidoscopy 11/06/2020-no evidence of recurrent tumor, anastomotic "sinus "posteriorly 6.  Right anterior chest wall pain December 2023 Medrol Dosepak 11/12/2022    Disposition: Craig [name] appears stable.  We reviewed the recent chest CT report/images with him at today's visit.  There is increased soft tissue  density surrounding surgical sutures.  Plan for repeat CT chest approximate 57-month interval.  He continues to have mild pain at the right anterior chest.  No associated tenderness on exam.  No mass.  No abnormality in this area on the recent chest CT.  Continue to monitor for now.  He will return for reevaluation in 4 to 6 weeks.  He will contact the office if the pain worsens prior to that appointment.  Patient seen with Craig [name].    Craig [name] ANP/GNP-BC   12/02/2022  12:11 PM  This was a shared visit with Craig [name].  Craig [name] was interviewed and examined.  We reviewed the chest CT findings and images with him.  There is no CT evidence to explain the right chest discomfort.  We will obtain a PET scan if the pain has not improved when he returns for an office visit next month.  The left upper lung density may be related to evolving radiation change.  We will plan for a follow-up chest CT within the next few months.  I was present for greater than 50% of today's visit.  I performed medical decision making.  Craig Bale, MD

## 2023-01-03 ENCOUNTER — Telehealth: Payer: Self-pay | Admitting: *Deleted

## 2023-01-03 ENCOUNTER — Inpatient Hospital Stay: Payer: Medicare HMO | Attending: Oncology | Admitting: Oncology

## 2023-01-03 NOTE — Telephone Encounter (Signed)
Attempted to reach patient re: "no show" today and there was no answer and voicemail. Unable to reach any of his three emergency contacts.

## 2023-01-17 ENCOUNTER — Other Ambulatory Visit: Payer: Self-pay | Admitting: Cardiology

## 2023-01-18 ENCOUNTER — Other Ambulatory Visit: Payer: Self-pay | Admitting: Cardiology

## 2023-07-07 ENCOUNTER — Telehealth: Payer: Self-pay

## 2023-07-07 NOTE — Telephone Encounter (Signed)
The patient has reached out to schedule a follow-up appointment that was missed in March. He reported experiencing recurrent symptoms, including pain on both sides of his chest and discomfort in his buttocks. The patient is concerned that these symptoms may indicate a recurrence of his previous condition. Additionally, he has contacted his primary care physician to discuss these symptoms further.

## 2023-07-08 ENCOUNTER — Other Ambulatory Visit (HOSPITAL_COMMUNITY): Payer: Self-pay | Admitting: Internal Medicine

## 2023-07-08 ENCOUNTER — Ambulatory Visit (HOSPITAL_COMMUNITY)
Admission: RE | Admit: 2023-07-08 | Discharge: 2023-07-08 | Disposition: A | Discharge status: Home / Self Care | Payer: 59 | Source: Ambulatory Visit | Attending: Internal Medicine | Admitting: Internal Medicine

## 2023-07-08 DIAGNOSIS — J984 Other disorders of lung: Secondary | ICD-10-CM | POA: Diagnosis present

## 2023-07-08 DIAGNOSIS — R079 Chest pain, unspecified: Secondary | ICD-10-CM | POA: Insufficient documentation

## 2023-07-13 ENCOUNTER — Inpatient Hospital Stay: Payer: Medicare HMO | Admitting: Nurse Practitioner

## 2023-07-20 ENCOUNTER — Inpatient Hospital Stay: Payer: Medicare HMO | Admitting: Nurse Practitioner

## 2023-07-25 ENCOUNTER — Encounter (INDEPENDENT_AMBULATORY_CARE_PROVIDER_SITE_OTHER): Payer: Self-pay | Admitting: *Deleted

## 2023-07-25 ENCOUNTER — Inpatient Hospital Stay: Payer: 59

## 2023-07-25 ENCOUNTER — Encounter: Payer: Self-pay | Admitting: Nurse Practitioner

## 2023-07-25 ENCOUNTER — Inpatient Hospital Stay: Payer: 59 | Attending: Nurse Practitioner | Admitting: Nurse Practitioner

## 2023-07-25 VITALS — BP 133/92 | HR 72 | Temp 98.2°F | Resp 18 | Ht 69.0 in | Wt 207.3 lb

## 2023-07-25 DIAGNOSIS — Z9221 Personal history of antineoplastic chemotherapy: Secondary | ICD-10-CM | POA: Diagnosis not present

## 2023-07-25 DIAGNOSIS — C20 Malignant neoplasm of rectum: Secondary | ICD-10-CM | POA: Diagnosis not present

## 2023-07-25 DIAGNOSIS — Z85048 Personal history of other malignant neoplasm of rectum, rectosigmoid junction, and anus: Secondary | ICD-10-CM | POA: Diagnosis present

## 2023-07-25 DIAGNOSIS — Z923 Personal history of irradiation: Secondary | ICD-10-CM | POA: Diagnosis not present

## 2023-07-25 LAB — CEA (ACCESS): CEA (CHCC): <1 ng/mL (ref 0.00–5.00)

## 2023-07-25 NOTE — Progress Notes (Signed)
[facility] OFFICE PROGRESS NOTE   Diagnosis: Rectal cancer  INTERVAL HISTORY:   Craig [name] returns for follow-up.  He has missed multiple scheduled appointments.  He was last seen 12/02/2022.  He had a CT scan of the chest 07/08/2023 showing increasing soft tissue mass surrounding the left upper lobe wedge resection suture lines.  He reports increased pain at the left upper medial chest.  He tried ibuprofen with some improvement.  He has an occasional cough.  No fever.  No shortness of breath.  No change in baseline bowel habits.  He has a good appetite.  Objective:  Vital signs in last 24 hours:  Blood pressure (!) 133/92, pulse 72, temperature 98.2 F (36.8 C), temperature source Oral, resp. rate 18, height 5\' 9"  (1.753 m), weight 207 lb 4.8 oz (94 kg), SpO2 98%.    Lymphatics: No palpable cervical, supraclavicular, axillary or inguinal lymph nodes. Resp: Lungs clear bilaterally. Cardio: Regular rate and rhythm. GI: No hepatosplenomegaly. Vascular: No leg edema. Musculoskeletal: Nontender over the anterior chest wall; no obvious mass.   Lab Results:  Lab Results  Component Value Date   WBC 2.3 (L) 09/29/2021   HGB 15.0 09/29/2021   HCT 44.7 09/29/2021   MCV 89.0 09/29/2021   PLT 219 09/29/2021   NEUTROABS 1.3 (L) 09/29/2021    Imaging:  No results found.  Medications: I have reviewed the patient's current medications.  Assessment/Plan: Rectal cancer Colonoscopy 09/08/2017, rectal mass at 5 cm from the anal verge, biopsy confirmed invasive adenocarcinoma Staging CTs 09/15/2017, rectal mass and perirectal lymphadenopathy, indeterminate right upper lobe nodule Elevated CEA MRI pelvis 10/07/2017-5.3 cm circumferential lower rectal mass with perirectal extension along the inferior mesenteric vein;  T3, N1;  distance from tumor to the anal sphincter is 5 cm. Initiation of radiation and concurrent Xeloda chemotherapy 10/12/2017; completion of  radiation/Xeloda 11/23/2017. Surgery 02/01/18 per Dr. Marin Olp; laparoscopic lower anterior rectosigmoid resection, coloanal anastomosis, diverting loop ileostomy, flexible sigmoidoscopy; ypT3, pN0 Developed low-grade fever and abdominal pain on POD 4, CT [facility] on 02/06/18 and 02/09/18 shows small loculated fluid collections in RLQ and presacral space, consulted IR for transgluteal presacral abscess drain, treated with IV zosyn then transitioned to po cipro and flagyl Follow-up CT 03/24/2018- 2.2 x 4.9 x 4.0 cm thick-walled presacral gas and fluid collection/abscess, minimally increased status post removal of surgical drain.  No evidence of recurrent or metastatic disease. Cycle 1 adjuvant Xeloda 04/12/2018 Cycle 2 adjuvant Xeloda 05/03/2018 Cycle 3 adjuvant Xeloda 06/05/2018 (Xeloda dose reduced to 1500 mg twice daily secondary to mild neutropenia) Cycle 4 adjuvant Xeloda 06/26/2018 Cycle 5 adjuvant Xeloda 07/18/2018 CTs 08/21/2018- no evidence of tumor recurrence or metastatic disease in the chest, abdomen or pelvis.  Presacral soft tissue thickening and some calcifications compatible with postradiation changes.  Stable probably benign 3 mm right upper lobe nodule. CTs 09/18/2019-new 1.2 cm pulmonary nodule within the left upper lobe; stable 3 mm nodule right upper lobe. PET scan 10/03/2019-hypermetabolic left upper lobe nodule, no evidence of local recurrence, no other evidence of metastatic disease Left video-assisted thoracoscopy, wedge resection 10/22/2019-left upper lobe wedge resection with metastatic adenocarcinoma (positive for cytokeratin 20 and CDX2, negative for cytokeratin 7 and TTF-1), no carcinoma identified in one lymph node, margins uninvolved by carcinoma; 10 L lymph node biopsy with no carcinoma identified; left pleural biopsy with no carcinoma identified. Ileostomy takedown 03/20/2020 CTs 07/01/2020-nodularity left upper lobe at site of previous wedge resection, atypical appearance for simple  postoperative change.  Soft tissue density about the ostomy takedown site. PET scan 10/14/2020-high metabolic activity along the extraluminal locules of gas contained in the increasingly prominent presacral density.  Enlarging hypermetabolic nodules in the left upper lobe particularly along the wedge resection site.  Indistinct faintly accentuated right infrahilar and left retrohilar activity. SBRT to left lung nodules 12/16/2020-12/26/2020, 5 fractions CTs 03/30/2021-decrease in nodularity at the the radiation/post resection margin, increased groundglass density laterally left upper lobe, no suspicious nodularity, no evidence of metastatic disease, persistent soft tissue thickening and gas collection the presacral space CTs 09/30/2021-no evidence of residual or recurrent disease within the chest, abdomen, pelvis.  Increasing soft tissue along the surgical margin left upper lobe wedge resection with progressive volume loss, likely progressive changes of radiation fibrosis.  Surgical changes of distal colectomy and colorectal anastomosis.  Stable infiltrative soft tissue within the presacral space with central cavitation. CTs 03/29/2022-unchanged posttreatment appearance of the presacral soft tissue with a small fistula cavity, unchanged appearance of the left pulmonary apex with treated soft tissue and radiation fibrosis no evidence of lymphadenopathy or metastatic disease CTs 11/12/2022-status post left upper lobe wedge resection.  Increased soft tissue density surrounding surgical sutures.  Similar appearance of presacral gas and fluid collection. CT chest 07/08/2023-increasing soft tissue mass surrounding the left upper lobe wedge resection suture lines.   2.  Pain and bleeding secondary to #1. Resolved.   3.  Skin breakdown at the gluteal fold secondary to radiation.  Resolved.   4.  History of neutropenia secondary to chemotherapy   5.  Possible rectal anastomotic leak confirmed on PET 10/14/2020 with  extraluminal gas Sigmoidoscopy 11/06/2020-no evidence of recurrent tumor, anastomotic "sinus "posteriorly 6.  Right anterior chest wall pain December 2023 Medrol Dosepak 11/12/2022  Disposition: Craig [name] appears stable.  We reviewed the recent chest CT report/images with him at today's visit.  Question local recurrence versus evolving radiation change.  His case will be presented at the upcoming GI tumor conference.  We will check a CEA today.  No obvious etiology for the right anterior chest pain noted on CT.  He will try Tylenol.  He will return for follow-up in 3 weeks.  Patient seen with Dr. Truett Perna.    Lonna Maxham ANP/GNP-BC   07/25/2023  12:07 PM This was a shared visit with Lonna Henckel.  Craig [name] was interviewed and examined.  We reviewed the CT findings and images with him.  He has a history of metastatic rectal cancer.  He has a history of right anterior chest discomfort since 2023.  I doubt the pain is related to metastatic rectal cancer.  The left upper lung densities most likely related to postradiation change.  The CEA remains normal.  I will present his case at the GI tumor conference next week.  I was present for greater than 50% of today's visit.  I performed medical decision making.  Mancel Bale, MD

## 2023-07-26 ENCOUNTER — Telehealth: Payer: Self-pay

## 2023-07-26 ENCOUNTER — Encounter: Payer: Self-pay | Admitting: Internal Medicine

## 2023-07-26 ENCOUNTER — Ambulatory Visit: Payer: 59 | Attending: Internal Medicine | Admitting: Internal Medicine

## 2023-07-26 VITALS — BP 118/80 | HR 86 | Ht 69.0 in | Wt 206.2 lb

## 2023-07-26 DIAGNOSIS — I10 Essential (primary) hypertension: Secondary | ICD-10-CM | POA: Diagnosis not present

## 2023-07-26 DIAGNOSIS — Z923 Personal history of irradiation: Secondary | ICD-10-CM | POA: Diagnosis not present

## 2023-07-26 DIAGNOSIS — R079 Chest pain, unspecified: Secondary | ICD-10-CM

## 2023-07-26 DIAGNOSIS — R0789 Other chest pain: Secondary | ICD-10-CM | POA: Diagnosis not present

## 2023-07-26 NOTE — Telephone Encounter (Signed)
-----   Message from Lonna Lindaman sent at 07/25/2023  4:16 PM EDT ----- Please let him know CEA is stable at less than 1.00.  Follow-up as scheduled.

## 2023-07-26 NOTE — Progress Notes (Signed)
Cardiology Office Note  Date: 07/26/2023   ID: Craig [name], DOB October 29, 1961, MRN [id number]  PCP:  Donetta Potts, MD  Cardiologist:  Marjo Bicker, MD Electrophysiologist:  None   History of Present Illness: Craig [name] is a 61 y.o. male known to have history of metastatic rectal cancer to left lung (s/p radiation therapy and surgery) is here for follow-up visit of chest pain.  Patient had chronic chest pain especially in the right side of his chest since 2022. They occur at rest and not with exertion. He reports the pain increased in intensity after his lung surgery. He was seen by Dr. Bjorn Pippin in 2022, pain was deemed to be noncardiac, recommended echocardiogram and CT cardiac but patient never followed through this.  He was referred back for management of chest pain.  He has ongoing persistent chest pain for the last 2 to 3 weeks, has active chest pain during the interview today, reported that he was able to sleep through it and manage his chores with no issues.  He said he was taking Tylenol but not improving his pain.  I reviewed the EKGs on the EMR which showed nonspecific T wave changes in the anterior leads since 2022 and no worsening on today's EKG.  No other symptoms of DOE, orthopnea, PND, dizziness, presyncope and syncope.    Past Medical History:  Diagnosis Date   Anxiety    Truck driver   Dysrhythmia    palpitations   Elevated serum creatinine    1.39 in 06/2012   Family history of cancer    GERD (gastroesophageal reflux disease)    History of kidney stones    Ileostomy in place [facility])    Palpitations    + chest pressure; PVCs   Rectal bleeding 01/07/2017   Rectal cancer (HCC) 09/30/2017   METASIZED TO LUNGS AND LYMPH NODES    Past Surgical History:  Procedure Laterality Date   COLONOSCOPY N/A 09/08/2017   Procedure: COLONOSCOPY;  Surgeon: Malissa Hippo, MD;  [facility];  Service: Endoscopy;  Laterality: N/A;   COLONOSCOPY N/A  12/14/2018   Procedure: COLONOSCOPY;  Surgeon: Malissa Hippo, MD;  Location: [facility];  Service: Endoscopy;  Laterality: N/A;  830   CYSTOSCOPY WITH RETROGRADE PYELOGRAM, URETEROSCOPY AND STENT PLACEMENT Bilateral 02/01/2018   Procedure: CYSTOSCOPY WITH RETROGRADE PYELOGRAM, URETEROSCOPY AND URETERAL CATHETERS BILATERAL;  Surgeon: Sebastian Ache, MD;  Location: [facility];  Service: Urology;  Laterality: Bilateral;   FLEXIBLE SIGMOIDOSCOPY N/A 09/13/2018   Procedure: FLEXIBLE SIGMOIDOSCOPY;  Surgeon: Andria Meuse, MD;  Location: [facility];  Service: General;  Laterality: N/A;   FLEXIBLE SIGMOIDOSCOPY N/A 05/30/2019   Procedure: Arnell Sieving;  Surgeon: Andria Meuse, MD;  Location: [facility];  Service: General;  Laterality: N/A;   FLEXIBLE SIGMOIDOSCOPY N/A 11/06/2020   Procedure: DIAGNOSTIC FLEXIBLE SIGMOIDOSCOPY;  Surgeon: Andria Meuse, MD;  Location: [facility];  Service: General;  Laterality: N/A;   ILEOSTOMY CLOSURE N/A 03/20/2020   Procedure: ILEOSTOMY TAKEDOWN;  Surgeon: Andria Meuse, MD;  Location: [facility];  Service: General;  Laterality: N/A;   LAPAROSCOPIC LOW ANTERIOR RESECTION N/A 02/01/2018   Procedure: LAPAROSCOPIC  LOW ANTERIOR RECTOSIGMOID RESECTION, COLOANAL ANASTOMOSIS;  DIVERTING LOOP ILESTOMY; FLEXIBLE SIGMOIDOSCOPY;  Surgeon: Andria Meuse, MD;  Location: [facility];  Service: General;  Laterality: N/A;   VIDEO ASSISTED THORACOSCOPY (VATS)/WEDGE RESECTION Left 10/22/2019   Procedure: VIDEO ASSISTED THORACOSCOPY (VATS) with mini thoracotomy/Left Upper Lobe Wedge RESECTION  x2, left pleural biopsy, lymph node sampling, intercoastal nerve block;  Surgeon: Delight Ovens, MD;  Location: [facility] OR;  Service: Thoracic;  Laterality: Left;   VIDEO BRONCHOSCOPY N/A 10/22/2019   Procedure: VIDEO BRONCHOSCOPY using anesthesia scope;  Surgeon: Delight Ovens, MD;  Location: [facility] OR;  Service: Thoracic;  Laterality: N/A;    Current  Outpatient Medications  Medication Sig Dispense Refill   acetaminophen (TYLENOL) 500 MG tablet Take 1,000 mg by mouth every 6 (six) hours as needed for moderate pain or headache.     amLODipine (NORVASC) 5 MG tablet TAKE 1 TABLET(5 MG) BY MOUTH DAILY 30 tablet 1   aspirin EC 81 MG tablet Take 81 mg by mouth daily. Swallow whole.     Menthol-Zinc Oxide (CALMOSEPTINE) 0.44-20.6 % OINT Apply topically.     naphazoline-glycerin (CLEAR EYES REDNESS) 0.012-0.2 % SOLN Place 1-2 drops into both eyes 4 (four) times daily as needed for eye irritation.      oxymetazoline (AFRIN) 0.05 % nasal spray Place 1 spray into both nostrils 2 (two) times daily as needed for congestion.     No current facility-administered medications for this visit.   Allergies:  Patient has no known allergies.   Social History: The patient  reports that he has quit smoking. He has never used smokeless tobacco. He reports that he does not currently use alcohol. He reports that he does not use drugs.   Family History: The patient's family history includes Cancer in his maternal aunt; Cancer (age of onset: 59) in his mother; Leukemia (age of onset: 41) in his sister.   ROS:  Please see the history of present illness. Otherwise, complete review of systems is positive for none  All other systems are reviewed and negative.   Physical Exam: VS:  BP 118/80   Pulse 86   Ht 5\' 9"  (1.753 m)   Wt 206 lb 3.2 oz (93.5 kg)   SpO2 94%   BMI 30.45 kg/m , BMI Body mass index is 30.45 kg/m.  Wt Readings from Last 3 Encounters:  07/26/23 206 lb 3.2 oz (93.5 kg)  07/25/23 207 lb 4.8 oz (94 kg)  12/02/22 206 lb 12.8 oz (93.8 kg)    General: Patient appears comfortable at rest. HEENT: Conjunctiva and lids normal, oropharynx clear with moist mucosa. Neck: Supple, no elevated JVP or carotid bruits, no thyromegaly. Lungs: Clear to auscultation, nonlabored breathing at rest. Cardiac: Regular rate and rhythm, no S3 or significant systolic  murmur, no pericardial rub. Abdomen: Soft, nontender, no hepatomegaly, bowel sounds present, no guarding or rebound. Extremities: No pitting edema, distal pulses 2+. Skin: Warm and dry. Musculoskeletal: No kyphosis. Neuropsychiatric: Alert and oriented x3, affect grossly appropriate.  Recent Labwork: 12/02/2022: BUN 15; Creatinine 1.29; Potassium 4.3; Sodium 138  No results found for: "CHOL", "TRIG", "HDL", "CHOLHDL", "VLDL", "LDLCALC", "LDLDIRECT"    Assessment and Plan:  Noncardiac chest pain: Chronic chest pain x 2 years, occurs at rest and not with exertion.  Persistent chest pain for the last 2 to 3 weeks but able to perform his household chores, work/driving and able to sleep through it with no issues.  I reviewed the EKGs in the EMR that showed nonspecific T wave changes in the anterior leads since 2022 and no worsening on today's EKG.  I do not think this chest pain is cardiac in origin.  No ischemia evaluation is indicated.  Pain management per primary team.  However will obtain 2D echocardiogram to rule out structural heart  disease, I do not suspect radiation induced heart disease.  HTN, controlled: Continue amlodipine 5 mg once daily.     Medication Adjustments/Labs and Tests Ordered: Current medicines are reviewed at length with the patient today.  Concerns regarding medicines are outlined above.    Disposition:  Follow up  pending results  Signed Braylen Staller Verne Spurr, MD, 07/26/2023 10:01 AM    [facility] [address] [place], [place], [place] [address]

## 2023-07-26 NOTE — Patient Instructions (Signed)
Medication Instructions:  Your physician recommends that you continue on your current medications as directed. Please refer to the Current Medication list given to you today.   Labwork: None  Testing/Procedures: Your physician has requested that you have an echocardiogram. Echocardiography is a painless test that uses sound waves to create images of your heart. It provides your doctor with information about the size and shape of your heart and how well your heart's chambers and valves are working. This procedure takes approximately one hour. There are no restrictions for this procedure. Please do NOT wear cologne, perfume, aftershave, or lotions (deodorant is allowed). Please arrive 15 minutes prior to your appointment time.   Follow-Up: Your physician recommends that you schedule a follow-up appointment in: Pending Results  Any Other Special Instructions Will Be Listed Below (If Applicable).  If you need a refill on your cardiac medications before your next appointment, please call your pharmacy.  

## 2023-07-26 NOTE — Telephone Encounter (Signed)
Patient gave verbal understanding and had no further questions or concerns  

## 2023-08-03 ENCOUNTER — Other Ambulatory Visit: Payer: Self-pay

## 2023-08-03 ENCOUNTER — Other Ambulatory Visit: Payer: Self-pay | Admitting: Nurse Practitioner

## 2023-08-03 DIAGNOSIS — C20 Malignant neoplasm of rectum: Secondary | ICD-10-CM

## 2023-08-03 NOTE — Progress Notes (Signed)
The proposed treatment discussed in conference is for discussion purpose only and is not a binding recommendation.  The patients have not been physically examined, or presented with their treatment options.  Therefore, final treatment plans cannot be decided.  

## 2023-08-05 ENCOUNTER — Telehealth: Payer: Self-pay

## 2023-08-05 NOTE — Telephone Encounter (Signed)
-----   Message from Lonna Harville sent at 08/03/2023  3:42 PM EDT ----- Please let him know his case was presented at the GI tumor conference.  Recommendation is for a PET scan.  I will place the order.

## 2023-08-05 NOTE — Telephone Encounter (Signed)
Patient gave verbal understanding had no further questions or concerns.

## 2023-08-05 NOTE — Telephone Encounter (Signed)
-----   Message from Lonna Mcfate sent at 08/03/2023  3:42 PM EDT ----- Please let him know his case was presented at the GI tumor conference.  Recommendation is for a PET scan.  I will place the order.

## 2023-08-05 NOTE — Telephone Encounter (Signed)
Patient gave verbal understanding and had no questions or concerns.

## 2023-08-08 ENCOUNTER — Encounter (HOSPITAL_COMMUNITY): Admission: RE | Admit: 2023-08-08 | Payer: 59 | Source: Ambulatory Visit

## 2023-08-08 ENCOUNTER — Other Ambulatory Visit: Payer: 59

## 2023-08-12 ENCOUNTER — Telehealth: Payer: Self-pay

## 2023-08-12 NOTE — Telephone Encounter (Signed)
The patient's PET scan has been canceled due to insurance issues. Consequently, the office visit has been rescheduled to October 14.

## 2023-08-15 ENCOUNTER — Inpatient Hospital Stay: Payer: 59 | Admitting: Nurse Practitioner

## 2023-08-16 ENCOUNTER — Other Ambulatory Visit (HOSPITAL_COMMUNITY): Payer: 59

## 2023-08-29 ENCOUNTER — Inpatient Hospital Stay: Payer: 59 | Admitting: Nurse Practitioner

## 2023-09-12 ENCOUNTER — Encounter (HOSPITAL_COMMUNITY): Payer: 59

## 2023-09-20 ENCOUNTER — Telehealth: Payer: Self-pay

## 2023-09-20 ENCOUNTER — Inpatient Hospital Stay: Payer: 59 | Attending: Nurse Practitioner | Admitting: Nurse Practitioner

## 2023-09-20 NOTE — Telephone Encounter (Signed)
Called patient on 09/20/23 @ 2:04 PM due to r/s him from his missed appointment on 09/20/23 @ 1:15 PM. Voicemail box full.

## 2023-09-30 ENCOUNTER — Telehealth: Payer: Self-pay | Admitting: *Deleted

## 2023-09-30 NOTE — Telephone Encounter (Signed)
Mr. Anastas called to request reschedule of his PET scan and office visit. Attempted to return call without success.

## 2023-10-03 ENCOUNTER — Encounter (HOSPITAL_COMMUNITY): Payer: Self-pay

## 2023-10-10 ENCOUNTER — Inpatient Hospital Stay: Payer: 59 | Admitting: Nurse Practitioner

## 2023-10-14 ENCOUNTER — Encounter (HOSPITAL_COMMUNITY): Payer: 59

## 2023-10-24 ENCOUNTER — Inpatient Hospital Stay: Payer: 59 | Attending: Nurse Practitioner | Admitting: Nurse Practitioner

## 2023-11-14 ENCOUNTER — Encounter (HOSPITAL_COMMUNITY)
Admission: RE | Admit: 2023-11-14 | Discharge: 2023-11-14 | Disposition: A | Discharge status: Home / Self Care | Payer: 59 | Source: Ambulatory Visit | Attending: Nurse Practitioner | Admitting: Nurse Practitioner

## 2023-11-14 DIAGNOSIS — C20 Malignant neoplasm of rectum: Secondary | ICD-10-CM | POA: Insufficient documentation

## 2023-11-14 LAB — GLUCOSE, CAPILLARY: Glucose-Capillary: 99 mg/dL (ref 70–99)

## 2023-11-14 MED ORDER — FLUDEOXYGLUCOSE F - 18 (FDG) INJECTION
10.1900 | Freq: Once | INTRAVENOUS | Status: AC
  Administered 2023-11-14: 10.19 via INTRAVENOUS

## 2023-11-28 ENCOUNTER — Telehealth: Payer: Self-pay | Admitting: *Deleted

## 2023-11-28 NOTE — Telephone Encounter (Signed)
-----   Message from Arley Hof sent at 11/26/2023  1:57 PM EST ----- Please call patient, PET shows stable changes at left lung surgical site and rectal fistula, mild hypermetabolism, no clear evidence of cancer, schedule f/u appt to review scan , will plan chest CT 3-6 months.CEA next office

## 2023-11-28 NOTE — Telephone Encounter (Signed)
 Attempted to reach patient with PET scan results and to schedule scan review appointment with him. His mailbox was full. Sent message via MyChart and requested scheduler call him for scan review appointment.

## 2023-11-29 ENCOUNTER — Telehealth: Payer: Self-pay | Admitting: *Deleted

## 2023-11-29 NOTE — Telephone Encounter (Signed)
 Called Craig [name] and informed him of Dr. Andriette assessment of his PET scan (he had not seen Mychart message): PET shows stable changes at left lung surgical site and rectal fistula, mild hypermetabolism, no clear evidence of cancer. Dr. Cloretta wants to schedule f/u appt to review scan. Will collect CEA as well at next visit.  He was calmer and now understands that his appointment is OK to wait a couple weeks for scan review.

## 2023-12-07 ENCOUNTER — Other Ambulatory Visit: Payer: Self-pay | Admitting: *Deleted

## 2023-12-07 DIAGNOSIS — C20 Malignant neoplasm of rectum: Secondary | ICD-10-CM

## 2023-12-15 ENCOUNTER — Inpatient Hospital Stay: Payer: 59 | Admitting: Oncology

## 2023-12-15 ENCOUNTER — Inpatient Hospital Stay: Payer: 59 | Attending: Nurse Practitioner

## 2023-12-26 ENCOUNTER — Inpatient Hospital Stay: Payer: 59 | Attending: Nurse Practitioner

## 2023-12-26 ENCOUNTER — Encounter: Payer: Self-pay | Admitting: Nurse Practitioner

## 2023-12-26 ENCOUNTER — Inpatient Hospital Stay: Payer: 59 | Admitting: Nurse Practitioner

## 2023-12-26 VITALS — BP 119/89 | HR 71 | Temp 98.1°F | Resp 18 | Ht 69.0 in | Wt 207.6 lb

## 2023-12-26 DIAGNOSIS — C20 Malignant neoplasm of rectum: Secondary | ICD-10-CM

## 2023-12-26 DIAGNOSIS — Z85048 Personal history of other malignant neoplasm of rectum, rectosigmoid junction, and anus: Secondary | ICD-10-CM | POA: Diagnosis present

## 2023-12-26 DIAGNOSIS — Z08 Encounter for follow-up examination after completed treatment for malignant neoplasm: Secondary | ICD-10-CM | POA: Diagnosis present

## 2023-12-26 DIAGNOSIS — J984 Other disorders of lung: Secondary | ICD-10-CM | POA: Diagnosis not present

## 2023-12-26 LAB — CEA (ACCESS): CEA (CHCC): <1 ng/mL (ref 0.00–5.00)

## 2023-12-26 NOTE — Progress Notes (Signed)
 [facility] OFFICE PROGRESS NOTE   Diagnosis: Rectal cancer  INTERVAL HISTORY:   Craig [name] returns for follow-up.  He has missed several scheduled appointments.  He was last seen 07/25/2023.  He reports continued erratic bowel habits.  He reports chronic weakness of the left arm since surgery.  For the past 2 weeks he has noted consistent numbness involving his left hand.  He denies neck pain and arm weakness.  No known injury.  Stable soreness along the right chest.  No shortness of breath.  Objective:  Vital signs in last 24 hours:  Blood pressure 119/89, pulse 71, temperature 98.1 F (36.7 C), temperature source Temporal, resp. rate 18, height 5\' 9"  (1.753 m), weight 207 lb 9.6 oz (94.2 kg), SpO2 96%.     Lymphatics: No palpable cervical, supraclavicular or axillary lymph nodes. Resp: Lungs clear bilaterally. Cardio: Regular rate and rhythm. GI: No hepatosplenomegaly. Vascular: No leg edema. Musculoskeletal: Tender right anterior chest wall.  No mass. Neuro: Upper extremity motor strength 5/5.    Lab Results:  Lab Results  Component Value Date   WBC 2.3 (L) 09/29/2021   HGB 15.0 09/29/2021   HCT 44.7 09/29/2021   MCV 89.0 09/29/2021   PLT 219 09/29/2021   NEUTROABS 1.3 (L) 09/29/2021    Imaging:  No results found.  Medications: I have reviewed the patient's current medications.  Assessment/Plan: Rectal cancer Colonoscopy 09/08/2017, rectal mass at 5 cm from the anal verge, biopsy confirmed invasive adenocarcinoma Staging CTs 09/15/2017, rectal mass and perirectal lymphadenopathy, indeterminate right upper lobe nodule Elevated CEA MRI pelvis 10/07/2017-5.3 cm circumferential lower rectal mass with perirectal extension along the inferior mesenteric vein;  T3, N1;  distance from tumor to the anal sphincter is 5 cm. Initiation of radiation and concurrent Xeloda  chemotherapy 10/12/2017; completion of radiation/Xeloda  11/23/2017. Surgery 02/01/18 per Dr.  Beatris Lincoln; laparoscopic lower anterior rectosigmoid resection, coloanal anastomosis, diverting loop ileostomy, flexible sigmoidoscopy; ypT3, pN0 Developed low-grade fever and abdominal pain on POD 4, CT AP on 02/06/18 and 02/09/18 shows small loculated fluid collections in RLQ and presacral space, consulted IR for transgluteal presacral abscess drain, treated with IV zosyn  then transitioned to po cipro  and flagyl  Follow-up CT 03/24/2018- 2.2 x 4.9 x 4.0 cm thick-walled presacral gas and fluid collection/abscess, minimally increased status post removal of surgical drain.  No evidence of recurrent or metastatic disease. Cycle 1 adjuvant Xeloda  04/12/2018 Cycle 2 adjuvant Xeloda  05/03/2018 Cycle 3 adjuvant Xeloda  06/05/2018 (Xeloda  dose reduced to 1500 mg twice daily secondary to mild neutropenia) Cycle 4 adjuvant Xeloda  06/26/2018 Cycle 5 adjuvant Xeloda  07/18/2018 CTs 08/21/2018- no evidence of tumor recurrence or metastatic disease in the chest, abdomen or pelvis.  Presacral soft tissue thickening and some calcifications compatible with postradiation changes.  Stable probably benign 3 mm right upper lobe nodule. CTs 09/18/2019-new 1.2 cm pulmonary nodule within the left upper lobe; stable 3 mm nodule right upper lobe. PET scan 10/03/2019-hypermetabolic left upper lobe nodule, no evidence of local recurrence, no other evidence of metastatic disease Left video-assisted thoracoscopy, wedge resection 10/22/2019-left upper lobe wedge resection with metastatic adenocarcinoma (positive for cytokeratin 20 and CDX2, negative for cytokeratin 7 and TTF-1), no carcinoma identified in one lymph node, margins uninvolved by carcinoma; 10 L lymph node biopsy with no carcinoma identified; left pleural biopsy with no carcinoma identified. Ileostomy takedown 03/20/2020 CTs 07/01/2020-nodularity left upper lobe at site of previous wedge resection, atypical appearance for simple postoperative change.  Soft tissue density about the  ostomy takedown  site. PET scan 10/14/2020-high metabolic activity along the extraluminal locules of gas contained in the increasingly prominent presacral density.  Enlarging hypermetabolic nodules in the left upper lobe particularly along the wedge resection site.  Indistinct faintly accentuated right infrahilar and left retrohilar activity. SBRT to left lung nodules 12/16/2020-12/26/2020, 5 fractions CTs 03/30/2021-decrease in nodularity at the the radiation/post resection margin, increased groundglass density laterally left upper lobe, no suspicious nodularity, no evidence of metastatic disease, persistent soft tissue thickening and gas collection the presacral space CTs 09/30/2021-no evidence of residual or recurrent disease within the chest, abdomen, pelvis.  Increasing soft tissue along the surgical margin left upper lobe wedge resection with progressive volume loss, likely progressive changes of radiation fibrosis.  Surgical changes of distal colectomy and colorectal anastomosis.  Stable infiltrative soft tissue within the presacral space with central cavitation. CTs 03/29/2022-unchanged posttreatment appearance of the presacral soft tissue with a small fistula cavity, unchanged appearance of the left pulmonary apex with treated soft tissue and radiation fibrosis no evidence of lymphadenopathy or metastatic disease CTs 11/12/2022-status post left upper lobe wedge resection.  Increased soft tissue density surrounding surgical sutures.  Similar appearance of presacral gas and fluid collection. CT chest 07/08/2023-increasing soft tissue mass surrounding the left upper lobe wedge resection suture lines. PET scan 11/14/2023-left apical consolidation surrounding surgical sutures demonstrates low-level, diffuse and nonfocal hypermetabolism, remains indeterminate for evolving radiation fibrosis versus local recurrence.   2.  Pain and bleeding secondary to #1. Resolved.   3.  Skin breakdown at the gluteal fold  secondary to radiation.  Resolved.   4.  History of neutropenia secondary to chemotherapy   5.  Possible rectal anastomotic leak confirmed on PET 10/14/2020 with extraluminal gas Sigmoidoscopy 11/06/2020-no evidence of recurrent tumor, anastomotic "sinus "posteriorly 6.  Right anterior chest wall pain December 2023 Medrol  Dosepak 11/12/2022  Disposition: Craig [name] appears stable.  We reviewed the recent PET scan report/images with him at today's visit.  There is low-level diffuse hypermetabolism involving the left lung surgical site.  Etiology is unclear.  Question evolving postradiation change.  Plan for follow-up noncontrast chest CT in approximately 2 months.  Craig [name] agrees with this plan.  Etiology of the left hand numbness is unclear.  Follow-up with his PCP if the numbness persists.  He will return for follow-up in about 2 months to review the restaging chest CT.  We are available to see him sooner if needed.  Patient seen with Dr. Scherrie Curt.    Diana Forster ANP/GNP-BC   12/26/2023  1:34 PM This was a shared visit with Diana Forster.  Craig [name] was interviewed and examined.  We reviewed the restaging PET findings and images with Craig [name].  The density at the left apex with mild associated hypermetabolism is likely due to postsurgical/radiation change.  He will be followed with surveillance imaging.  The presacral gas/soft tissue density is chronic and likely due to a fistula.  We recommended he follow-up with his primary care provider for evaluation of the left hand numbness.   I was present for greater than 50% of today's visit.  I performed Medical Decision Making.  Anise Kerns, MD

## 2024-01-24 ENCOUNTER — Encounter (INDEPENDENT_AMBULATORY_CARE_PROVIDER_SITE_OTHER): Payer: Self-pay | Admitting: *Deleted

## 2024-02-21 ENCOUNTER — Ambulatory Visit: Payer: 59 | Attending: Internal Medicine

## 2024-02-27 ENCOUNTER — Ambulatory Visit (HOSPITAL_BASED_OUTPATIENT_CLINIC_OR_DEPARTMENT_OTHER): Admission: RE | Admit: 2024-02-27 | Payer: Self-pay | Source: Ambulatory Visit

## 2024-02-27 ENCOUNTER — Inpatient Hospital Stay: Payer: 59 | Attending: Nurse Practitioner

## 2024-03-01 ENCOUNTER — Inpatient Hospital Stay: Payer: 59 | Admitting: Oncology

## 2024-05-25 ENCOUNTER — Telehealth: Payer: Self-pay | Admitting: Oncology

## 2024-05-25 NOTE — Telephone Encounter (Signed)
 Called PT to rescheduled appt, mailbox is full.

## 2024-05-31 ENCOUNTER — Encounter: Payer: Self-pay | Admitting: *Deleted

## 2024-05-31 NOTE — Progress Notes (Signed)
 Patient called on 7/14 and 7/17 for a return office visit with  Dr Cloretta or Olam Ned NP, unable to reach pt or leave voicemail

## 2024-06-11 ENCOUNTER — Telehealth: Payer: Self-pay | Admitting: Oncology

## 2024-06-11 NOTE — Telephone Encounter (Signed)
 Called PT to reschedule appt. Left VM

## 2024-07-30 ENCOUNTER — Encounter (INDEPENDENT_AMBULATORY_CARE_PROVIDER_SITE_OTHER): Payer: Self-pay | Admitting: *Deleted

## 2024-12-18 ENCOUNTER — Other Ambulatory Visit: Payer: Self-pay
